# Patient Record
Sex: Female | Born: 1965
Health system: Southern US, Community
[De-identification: ages and names within clinical notes are randomized; demographics above are authoritative.]

## PROBLEM LIST (undated history)

## (undated) DIAGNOSIS — D649 Anemia, unspecified: Secondary | ICD-10-CM

## (undated) DIAGNOSIS — E669 Obesity, unspecified: Secondary | ICD-10-CM

## (undated) DIAGNOSIS — K219 Gastro-esophageal reflux disease without esophagitis: Secondary | ICD-10-CM

## (undated) DIAGNOSIS — Z5189 Encounter for other specified aftercare: Secondary | ICD-10-CM

## (undated) DIAGNOSIS — R232 Flushing: Secondary | ICD-10-CM

## (undated) DIAGNOSIS — R635 Abnormal weight gain: Secondary | ICD-10-CM

## (undated) DIAGNOSIS — E119 Type 2 diabetes mellitus without complications: Secondary | ICD-10-CM

## (undated) DIAGNOSIS — K760 Fatty (change of) liver, not elsewhere classified: Secondary | ICD-10-CM

## (undated) DIAGNOSIS — F410 Panic disorder [episodic paroxysmal anxiety] without agoraphobia: Secondary | ICD-10-CM

## (undated) DIAGNOSIS — M797 Fibromyalgia: Secondary | ICD-10-CM

## (undated) DIAGNOSIS — Z9889 Other specified postprocedural states: Secondary | ICD-10-CM

## (undated) DIAGNOSIS — F419 Anxiety disorder, unspecified: Secondary | ICD-10-CM

## (undated) DIAGNOSIS — R42 Dizziness and giddiness: Secondary | ICD-10-CM

## (undated) DIAGNOSIS — I10 Essential (primary) hypertension: Secondary | ICD-10-CM

## (undated) DIAGNOSIS — R112 Nausea with vomiting, unspecified: Secondary | ICD-10-CM

## (undated) DIAGNOSIS — IMO0002 Reserved for concepts with insufficient information to code with codable children: Secondary | ICD-10-CM

## (undated) DIAGNOSIS — G709 Myoneural disorder, unspecified: Secondary | ICD-10-CM

## (undated) DIAGNOSIS — M109 Gout, unspecified: Secondary | ICD-10-CM

## (undated) HISTORY — DX: Fatty (change of) liver, not elsewhere classified: K76.0

## (undated) HISTORY — DX: Fibromyalgia: M79.7

## (undated) HISTORY — DX: Reserved for concepts with insufficient information to code with codable children: IMO0002

## (undated) HISTORY — PX: ABDOMINAL HYSTERECTOMY: SHX81

## (undated) HISTORY — PX: BREAST REDUCTION SURGERY: SHX8

## (undated) HISTORY — PX: LAPAROSCOPY: SHX197

## (undated) HISTORY — DX: Flushing: R23.2

## (undated) HISTORY — PX: ECTOPIC PREGNANCY SURGERY: SHX613

## (undated) HISTORY — PX: REDUCTION MAMMAPLASTY: SUR839

## (undated) HISTORY — DX: Anemia, unspecified: D64.9

## (undated) HISTORY — DX: Obesity, unspecified: E66.9

## (undated) HISTORY — DX: Gout, unspecified: M10.9

## (undated) HISTORY — DX: Abnormal weight gain: R63.5

## (undated) HISTORY — DX: Myoneural disorder, unspecified: G70.9

## (undated) HISTORY — DX: Encounter for other specified aftercare: Z51.89

---

## 2001-06-04 ENCOUNTER — Encounter: Payer: Self-pay | Admitting: Emergency Medicine

## 2001-06-04 ENCOUNTER — Inpatient Hospital Stay (HOSPITAL_COMMUNITY): Admission: EM | Admit: 2001-06-04 | Discharge: 2001-06-05 | Payer: Self-pay | Admitting: Emergency Medicine

## 2002-01-27 ENCOUNTER — Other Ambulatory Visit: Admission: RE | Admit: 2002-01-27 | Discharge: 2002-01-27 | Payer: Self-pay | Admitting: Obstetrics and Gynecology

## 2002-06-13 ENCOUNTER — Inpatient Hospital Stay (HOSPITAL_COMMUNITY): Admission: AD | Admit: 2002-06-13 | Discharge: 2002-06-13 | Payer: Self-pay | Admitting: *Deleted

## 2003-02-17 ENCOUNTER — Emergency Department (HOSPITAL_COMMUNITY): Admission: EM | Admit: 2003-02-17 | Discharge: 2003-02-17 | Payer: Self-pay | Admitting: Emergency Medicine

## 2003-02-17 ENCOUNTER — Encounter: Payer: Self-pay | Admitting: Emergency Medicine

## 2004-07-13 ENCOUNTER — Ambulatory Visit (HOSPITAL_COMMUNITY): Admission: RE | Admit: 2004-07-13 | Discharge: 2004-07-13 | Payer: Self-pay | Admitting: Obstetrics and Gynecology

## 2005-08-20 ENCOUNTER — Emergency Department (HOSPITAL_COMMUNITY): Admission: EM | Admit: 2005-08-20 | Discharge: 2005-08-20 | Payer: Self-pay | Admitting: Family Medicine

## 2006-09-05 ENCOUNTER — Emergency Department (HOSPITAL_COMMUNITY): Admission: EM | Admit: 2006-09-05 | Discharge: 2006-09-05 | Payer: Self-pay | Admitting: Emergency Medicine

## 2007-01-20 ENCOUNTER — Emergency Department (HOSPITAL_COMMUNITY): Admission: EM | Admit: 2007-01-20 | Discharge: 2007-01-20 | Payer: Self-pay | Admitting: Emergency Medicine

## 2007-10-30 ENCOUNTER — Other Ambulatory Visit: Admission: RE | Admit: 2007-10-30 | Discharge: 2007-10-30 | Payer: Self-pay | Admitting: Obstetrics and Gynecology

## 2008-01-04 ENCOUNTER — Emergency Department (HOSPITAL_COMMUNITY): Admission: EM | Admit: 2008-01-04 | Discharge: 2008-01-04 | Payer: Self-pay | Admitting: Emergency Medicine

## 2009-04-29 ENCOUNTER — Other Ambulatory Visit: Admission: RE | Admit: 2009-04-29 | Discharge: 2009-04-29 | Payer: Self-pay | Admitting: Obstetrics and Gynecology

## 2010-09-14 ENCOUNTER — Ambulatory Visit (HOSPITAL_COMMUNITY): Admission: RE | Admit: 2010-09-14 | Discharge: 2010-09-14 | Payer: Self-pay | Admitting: Internal Medicine

## 2010-10-05 ENCOUNTER — Emergency Department (HOSPITAL_COMMUNITY): Admission: EM | Admit: 2010-10-05 | Discharge: 2010-10-05 | Payer: Self-pay | Admitting: Emergency Medicine

## 2010-12-24 ENCOUNTER — Encounter: Payer: Self-pay | Admitting: Obstetrics and Gynecology

## 2011-03-03 ENCOUNTER — Inpatient Hospital Stay (INDEPENDENT_AMBULATORY_CARE_PROVIDER_SITE_OTHER)
Admission: RE | Admit: 2011-03-03 | Discharge: 2011-03-03 | Disposition: A | Discharge status: Home / Self Care | Payer: Self-pay | Source: Ambulatory Visit | Attending: Family Medicine | Admitting: Family Medicine

## 2011-03-03 DIAGNOSIS — R5381 Other malaise: Secondary | ICD-10-CM

## 2011-03-03 LAB — POCT I-STAT, CHEM 8
BUN: 11 mg/dL (ref 6–23)
Calcium, Ion: 1.16 mmol/L (ref 1.12–1.32)
Chloride: 102 mEq/L (ref 96–112)
Glucose, Bld: 107 mg/dL — ABNORMAL HIGH (ref 70–99)
Hemoglobin: 16.3 g/dL — ABNORMAL HIGH (ref 12.0–15.0)
TCO2: 28 mmol/L (ref 0–100)

## 2011-03-03 LAB — POCT URINALYSIS DIP (DEVICE)
Bilirubin Urine: NEGATIVE
Glucose, UA: NEGATIVE mg/dL
Ketones, ur: NEGATIVE mg/dL
Nitrite: NEGATIVE
Protein, ur: NEGATIVE mg/dL
Specific Gravity, Urine: 1.015 (ref 1.005–1.030)
Urobilinogen, UA: 0.2 mg/dL (ref 0.0–1.0)

## 2011-03-03 LAB — POCT PREGNANCY, URINE: Preg Test, Ur: NEGATIVE

## 2011-05-04 ENCOUNTER — Inpatient Hospital Stay (HOSPITAL_COMMUNITY)
Admission: AD | Admit: 2011-05-04 | Discharge: 2011-05-04 | Disposition: A | Discharge status: Home / Self Care | Payer: BC Managed Care – PPO | Source: Ambulatory Visit | Attending: Obstetrics and Gynecology | Admitting: Obstetrics and Gynecology

## 2011-05-04 DIAGNOSIS — N920 Excessive and frequent menstruation with regular cycle: Secondary | ICD-10-CM | POA: Insufficient documentation

## 2011-05-04 DIAGNOSIS — N946 Dysmenorrhea, unspecified: Secondary | ICD-10-CM | POA: Insufficient documentation

## 2011-05-04 LAB — POCT PREGNANCY, URINE: Preg Test, Ur: NEGATIVE

## 2011-05-04 LAB — CBC
MCH: 29.3 pg (ref 26.0–34.0)
MCV: 84.2 fL (ref 78.0–100.0)
Platelets: 257 10*3/uL (ref 150–400)
RDW: 13.5 % (ref 11.5–15.5)
WBC: 11.4 10*3/uL — ABNORMAL HIGH (ref 4.0–10.5)

## 2011-05-04 LAB — URINALYSIS, ROUTINE W REFLEX MICROSCOPIC
Protein, ur: 100 mg/dL — AB
Specific Gravity, Urine: 1.02 (ref 1.005–1.030)
Urobilinogen, UA: 1 mg/dL (ref 0.0–1.0)
pH: 5.5 (ref 5.0–8.0)

## 2011-05-04 LAB — URINE MICROSCOPIC-ADD ON

## 2011-05-05 LAB — URINE CULTURE: Colony Count: 6000

## 2011-05-22 ENCOUNTER — Inpatient Hospital Stay (INDEPENDENT_AMBULATORY_CARE_PROVIDER_SITE_OTHER)
Admission: RE | Admit: 2011-05-22 | Discharge: 2011-05-22 | Disposition: A | Discharge status: Home / Self Care | Payer: BC Managed Care – PPO | Source: Ambulatory Visit | Attending: Family Medicine | Admitting: Family Medicine

## 2011-05-22 DIAGNOSIS — N949 Unspecified condition associated with female genital organs and menstrual cycle: Secondary | ICD-10-CM

## 2011-05-23 LAB — POCT I-STAT, CHEM 8
Calcium, Ion: 1.16 mmol/L (ref 1.12–1.32)
Chloride: 100 mEq/L (ref 96–112)
Creatinine, Ser: 0.7 mg/dL (ref 0.50–1.10)
Glucose, Bld: 102 mg/dL — ABNORMAL HIGH (ref 70–99)
Potassium: 3.6 mEq/L (ref 3.5–5.1)
Sodium: 139 mEq/L (ref 135–145)

## 2011-05-23 LAB — POCT URINALYSIS DIP (DEVICE)
Bilirubin Urine: NEGATIVE
Ketones, ur: NEGATIVE mg/dL
Nitrite: NEGATIVE
Protein, ur: NEGATIVE mg/dL
Urobilinogen, UA: 0.2 mg/dL (ref 0.0–1.0)
pH: 7 (ref 5.0–8.0)

## 2011-06-08 ENCOUNTER — Encounter (HOSPITAL_COMMUNITY): Payer: Self-pay

## 2011-06-08 ENCOUNTER — Encounter (HOSPITAL_COMMUNITY)
Admission: RE | Admit: 2011-06-08 | Discharge: 2011-06-08 | Disposition: A | Discharge status: Home / Self Care | Payer: BC Managed Care – PPO | Source: Ambulatory Visit | Attending: Family Medicine | Admitting: Family Medicine

## 2011-06-08 HISTORY — DX: Other specified postprocedural states: Z98.890

## 2011-06-08 HISTORY — DX: Essential (primary) hypertension: I10

## 2011-06-08 HISTORY — DX: Other specified postprocedural states: R11.2

## 2011-06-08 HISTORY — DX: Dizziness and giddiness: R42

## 2011-06-08 HISTORY — DX: Gastro-esophageal reflux disease without esophagitis: K21.9

## 2011-06-08 HISTORY — DX: Anxiety disorder, unspecified: F41.9

## 2011-06-08 LAB — BASIC METABOLIC PANEL
BUN: 7 mg/dL (ref 6–23)
CO2: 28 mEq/L (ref 19–32)
Chloride: 101 mEq/L (ref 96–112)
Glucose, Bld: 100 mg/dL — ABNORMAL HIGH (ref 70–99)

## 2011-06-08 LAB — CBC
HCT: 39.8 % (ref 36.0–46.0)
Hemoglobin: 13.2 g/dL (ref 12.0–15.0)
MCH: 27.7 pg (ref 26.0–34.0)
MCHC: 33.2 g/dL (ref 30.0–36.0)
MCV: 83.6 fL (ref 78.0–100.0)
Platelets: 262 10*3/uL (ref 150–400)
RDW: 13.2 % (ref 11.5–15.5)
WBC: 6.9 10*3/uL (ref 4.0–10.5)

## 2011-06-08 LAB — SURGICAL PCR SCREEN
MRSA, PCR: INVALID — AB
Staphylococcus aureus: INVALID — AB

## 2011-06-08 NOTE — Pre-Procedure Instructions (Signed)
EKG done-spoke with Dr Towanda Malkin to hold HCTZ am of surgery, aware of b/p 145/94

## 2011-06-08 NOTE — Patient Instructions (Addendum)
[facility]  06/08/2011   Your procedure is scheduled on:  Wednesday  Report to [facility] at 9 AM.  Call this number if you have problems the morning of surgery: [phone]   Remember:   Do not eat food:After Midnight.  Do not drink clear liquids: After Midnight.  Take these medicines the morning of surgery with A SIP OF WATER: per anesthesia   Do not wear jewelry, make-up or nail polish.  Do not bring valuables to the hospital.  Contacts, dentures or bridgework may not be worn into surgery.  Leave suitcase in the car. After surgery it may be brought to your room.  For patients admitted to the hospital, checkout time is 11:00 AM the day of discharge.   Patients discharged the day of surgery will not be allowed to drive home.  Name and phone number of your driver: xx  Special Instructions: take prilosec morning of surgery with small sips of water, hold hctz dos per Dr Dawayne Cirri  Please read over the following fact sheets that you were given: Surgical Site Infection Information Sheet    Written instructions given to pt-

## 2011-06-10 LAB — MRSA CULTURE

## 2011-06-13 ENCOUNTER — Encounter: Payer: Self-pay | Admitting: Obstetrics and Gynecology

## 2011-06-13 MED ORDER — DEXTROSE 5 % IV SOLN
1.0000 g | Freq: Once | INTRAVENOUS | Status: AC
  Administered 2011-06-14: 1 g via INTRAVENOUS
  Filled 2011-06-13 (×2): qty 1

## 2011-06-14 ENCOUNTER — Ambulatory Visit (HOSPITAL_COMMUNITY): Payer: BC Managed Care – PPO | Admitting: Anesthesiology

## 2011-06-14 ENCOUNTER — Encounter (HOSPITAL_COMMUNITY)
Admission: EM | Disposition: A | Discharge status: Home / Self Care | Payer: Self-pay | Source: Ambulatory Visit | Attending: Obstetrics and Gynecology

## 2011-06-14 ENCOUNTER — Ambulatory Visit (HOSPITAL_COMMUNITY)
Admission: EM | Admit: 2011-06-14 | Discharge: 2011-06-15 | Disposition: A | Discharge status: Home / Self Care | Payer: BC Managed Care – PPO | Source: Ambulatory Visit | Attending: Obstetrics and Gynecology | Admitting: Obstetrics and Gynecology

## 2011-06-14 ENCOUNTER — Other Ambulatory Visit: Payer: Self-pay | Admitting: Obstetrics and Gynecology

## 2011-06-14 ENCOUNTER — Encounter (HOSPITAL_COMMUNITY): Payer: Self-pay | Admitting: Anesthesiology

## 2011-06-14 DIAGNOSIS — D250 Submucous leiomyoma of uterus: Secondary | ICD-10-CM | POA: Insufficient documentation

## 2011-06-14 DIAGNOSIS — Z01818 Encounter for other preprocedural examination: Secondary | ICD-10-CM | POA: Insufficient documentation

## 2011-06-14 DIAGNOSIS — Z01812 Encounter for preprocedural laboratory examination: Secondary | ICD-10-CM | POA: Insufficient documentation

## 2011-06-14 DIAGNOSIS — IMO0002 Reserved for concepts with insufficient information to code with codable children: Secondary | ICD-10-CM | POA: Insufficient documentation

## 2011-06-14 DIAGNOSIS — Z90710 Acquired absence of both cervix and uterus: Secondary | ICD-10-CM

## 2011-06-14 DIAGNOSIS — N840 Polyp of corpus uteri: Secondary | ICD-10-CM | POA: Insufficient documentation

## 2011-06-14 DIAGNOSIS — N8000 Endometriosis of the uterus, unspecified: Secondary | ICD-10-CM | POA: Insufficient documentation

## 2011-06-14 DIAGNOSIS — N949 Unspecified condition associated with female genital organs and menstrual cycle: Secondary | ICD-10-CM | POA: Insufficient documentation

## 2011-06-14 DIAGNOSIS — N938 Other specified abnormal uterine and vaginal bleeding: Secondary | ICD-10-CM | POA: Insufficient documentation

## 2011-06-14 HISTORY — PX: LAPAROSCOPIC ASSISTED VAGINAL HYSTERECTOMY: SHX5398

## 2011-06-14 SURGERY — HYSTERECTOMY, VAGINAL, LAPAROSCOPY-ASSISTED
Anesthesia: General | Wound class: Clean Contaminated

## 2011-06-14 MED ORDER — PROPOFOL 10 MG/ML IV EMUL
INTRAVENOUS | Status: DC | PRN
  Administered 2011-06-14: 200 mg via INTRAVENOUS

## 2011-06-14 MED ORDER — MIDAZOLAM HCL 5 MG/5ML IJ SOLN
INTRAMUSCULAR | Status: DC | PRN
  Administered 2011-06-14: 2 mg via INTRAVENOUS

## 2011-06-14 MED ORDER — NEOSTIGMINE METHYLSULFATE 1 MG/ML IJ SOLN
INTRAMUSCULAR | Status: AC
  Filled 2011-06-14: qty 10

## 2011-06-14 MED ORDER — MENTHOL 3 MG MT LOZG: 1.0000 | LOZENGE | OROMUCOSAL | Status: DC | PRN

## 2011-06-14 MED ORDER — ONDANSETRON HCL 4 MG/2ML IJ SOLN
INTRAMUSCULAR | Status: DC | PRN
  Administered 2011-06-14: 4 mg via INTRAVENOUS

## 2011-06-14 MED ORDER — TRAMADOL HCL 50 MG PO TABS: 50.0000 mg | ORAL_TABLET | Freq: Four times a day (QID) | ORAL | Status: DC | PRN

## 2011-06-14 MED ORDER — MORPHINE SULFATE 4 MG/ML IJ SOLN: 1.0000 mg | INTRAMUSCULAR | Status: DC | PRN

## 2011-06-14 MED ORDER — BISACODYL 10 MG RE SUPP: 10.0000 mg | Freq: Every day | RECTAL | Status: DC | PRN

## 2011-06-14 MED ORDER — SCOPOLAMINE 1 MG/3DAYS TD PT72
1.0000 | MEDICATED_PATCH | TRANSDERMAL | Status: DC
  Administered 2011-06-14: 1.5 mg via TRANSDERMAL

## 2011-06-14 MED ORDER — IBUPROFEN 600 MG PO TABS
600.0000 mg | ORAL_TABLET | Freq: Four times a day (QID) | ORAL | Status: DC | PRN
  Administered 2011-06-14: 600 mg via ORAL
  Filled 2011-06-14: qty 1

## 2011-06-14 MED ORDER — LIDOCAINE HCL (CARDIAC) 20 MG/ML IV SOLN
INTRAVENOUS | Status: DC | PRN
  Administered 2011-06-14: 80 mg via INTRAVENOUS

## 2011-06-14 MED ORDER — ZOLPIDEM TARTRATE 5 MG PO TABS: 5.0000 mg | ORAL_TABLET | Freq: Every evening | ORAL | Status: DC | PRN

## 2011-06-14 MED ORDER — NEOSTIGMINE METHYLSULFATE 1 MG/ML IJ SOLN
INTRAMUSCULAR | Status: DC | PRN
  Administered 2011-06-14: 2 mg via INTRAMUSCULAR

## 2011-06-14 MED ORDER — GLYCOPYRROLATE 0.2 MG/ML IJ SOLN
INTRAMUSCULAR | Status: AC
  Filled 2011-06-14: qty 5

## 2011-06-14 MED ORDER — ROCURONIUM BROMIDE 100 MG/10ML IV SOLN
INTRAVENOUS | Status: DC | PRN
  Administered 2011-06-14: 40 mg via INTRAVENOUS

## 2011-06-14 MED ORDER — LACTATED RINGERS IV SOLN
INTRAVENOUS | Status: DC
  Administered 2011-06-14 (×3): via INTRAVENOUS

## 2011-06-14 MED ORDER — SIMETHICONE 80 MG PO CHEW: 80.0000 mg | CHEWABLE_TABLET | Freq: Four times a day (QID) | ORAL | Status: DC | PRN

## 2011-06-14 MED ORDER — GLYCOPYRROLATE 0.2 MG/ML IJ SOLN
INTRAMUSCULAR | Status: DC | PRN
  Administered 2011-06-14: 0.3 mg via INTRAVENOUS

## 2011-06-14 MED ORDER — BUPIVACAINE HCL (PF) 0.25 % IJ SOLN
INTRAMUSCULAR | Status: DC | PRN
  Administered 2011-06-14: 10 mL

## 2011-06-14 MED ORDER — FENTANYL CITRATE 0.05 MG/ML IJ SOLN
INTRAMUSCULAR | Status: AC
  Filled 2011-06-14: qty 2

## 2011-06-14 MED ORDER — ALUM & MAG HYDROXIDE-SIMETH 200-200-20 MG/5ML PO SUSP: 30.0000 mL | ORAL | Status: DC | PRN

## 2011-06-14 MED ORDER — PROMETHAZINE HCL 25 MG/ML IJ SOLN
12.5000 mg | INTRAMUSCULAR | Status: DC | PRN
  Administered 2011-06-14: 12.5 mg via INTRAVENOUS
  Filled 2011-06-14: qty 1

## 2011-06-14 MED ORDER — DEXAMETHASONE SODIUM PHOSPHATE 10 MG/ML IJ SOLN
INTRAMUSCULAR | Status: DC | PRN
  Administered 2011-06-14: 10 mg via INTRAVENOUS

## 2011-06-14 MED ORDER — LACTATED RINGERS IR SOLN
Status: DC | PRN
  Administered 2011-06-14: 3000 mL

## 2011-06-14 MED ORDER — FENTANYL CITRATE 0.05 MG/ML IJ SOLN
INTRAMUSCULAR | Status: DC | PRN
  Administered 2011-06-14: 50 ug via INTRAVENOUS
  Administered 2011-06-14: 150 ug via INTRAVENOUS
  Administered 2011-06-14 (×3): 50 ug via INTRAVENOUS

## 2011-06-14 MED ORDER — LACTATED RINGERS IV SOLN
INTRAVENOUS | Status: DC
  Administered 2011-06-14 – 2011-06-15 (×3): via INTRAVENOUS

## 2011-06-14 MED ORDER — FENTANYL CITRATE 0.05 MG/ML IJ SOLN: 25.0000 ug | INTRAMUSCULAR | Status: DC | PRN

## 2011-06-14 MED ORDER — SCOPOLAMINE 1 MG/3DAYS TD PT72
MEDICATED_PATCH | TRANSDERMAL | Status: AC
  Administered 2011-06-14: 1.5 mg via TRANSDERMAL
  Filled 2011-06-14: qty 1

## 2011-06-14 SURGICAL SUPPLY — 43 items
BLADE SURG 15 STRL LF C SS BP (BLADE) ×1 IMPLANT
BLADE SURG 15 STRL SS (BLADE) ×1
CABLE HIGH FREQUENCY MONO STRZ (ELECTRODE) IMPLANT
CLOTH BEACON ORANGE TIMEOUT ST (SAFETY) ×2 IMPLANT
CONT PATH 16OZ SNAP LID 3702 (MISCELLANEOUS) ×2 IMPLANT
COVER TABLE BACK 60X90 (DRAPES) ×2 IMPLANT
DECANTER SPIKE VIAL GLASS SM (MISCELLANEOUS) IMPLANT
DERMABOND ADVANCED (GAUZE/BANDAGES/DRESSINGS) ×1 IMPLANT
DERMABOND ADVANCED .7 DNX12 (GAUZE/BANDAGES/DRESSINGS) ×1 IMPLANT
DRAPE UTILITY XL STRL (DRAPES) ×2 IMPLANT
DRSG COVADERM PLUS 2X2 (GAUZE/BANDAGES/DRESSINGS) ×2 IMPLANT
ELECT LIGASURE LONG (ELECTRODE) ×2 IMPLANT
ELECT REM PT RETURN 9FT ADLT (ELECTROSURGICAL)
ELECTRODE REM PT RTRN 9FT ADLT (ELECTROSURGICAL) IMPLANT
FORCEPS CUTTING 45CM 5MM (CUTTING FORCEPS) ×2 IMPLANT
GLOVE BIO SURGEON STRL SZ8 (GLOVE) ×2 IMPLANT
GLOVE BIOGEL PI IND STRL 6.5 (GLOVE) ×1 IMPLANT
GLOVE BIOGEL PI INDICATOR 6.5 (GLOVE) ×1
GLOVE SURG ORTHO 8.0 STRL STRW (GLOVE) ×6 IMPLANT
GOWN BRE IMP SLV AUR LG STRL (GOWN DISPOSABLE) ×6 IMPLANT
GOWN BRE IMP SLV AUR XL STRL (GOWN DISPOSABLE) ×2 IMPLANT
NEEDLE INSUFFLATION 14GA 120MM (NEEDLE) ×2 IMPLANT
NS IRRIG 1000ML POUR BTL (IV SOLUTION) ×2 IMPLANT
PACK LAVH (CUSTOM PROCEDURE TRAY) ×2 IMPLANT
SET IRRIG TUBING LAPAROSCOPIC (IRRIGATION / IRRIGATOR) IMPLANT
SOLUTION ELECTROLUBE (MISCELLANEOUS) IMPLANT
STRIP CLOSURE SKIN 1/4X3 (GAUZE/BANDAGES/DRESSINGS) IMPLANT
SUT MNCRL 0 MO-4 VIOLET 18 CR (SUTURE) ×3 IMPLANT
SUT MNCRL 0 VIOLET 6X18 (SUTURE) ×1 IMPLANT
SUT MNCRL AB 0 CT1 27 (SUTURE) ×2 IMPLANT
SUT MON AB 2-0 CT1 36 (SUTURE) IMPLANT
SUT MONOCRYL 0 6X18 (SUTURE) ×1
SUT MONOCRYL 0 MO 4 18  CR/8 (SUTURE) ×3
SUT VIC AB 3-0 SH 27 (SUTURE)
SUT VIC AB 3-0 SH 27X BRD (SUTURE) IMPLANT
SUT VICRYL 0 UR6 27IN ABS (SUTURE) ×2 IMPLANT
SUT VICRYL RAPIDE 3 0 (SUTURE) ×2 IMPLANT
TOWEL OR 17X24 6PK STRL BLUE (TOWEL DISPOSABLE) ×4 IMPLANT
TRAY FOLEY CATH 14FR (SET/KITS/TRAYS/PACK) ×2 IMPLANT
TROCAR Z-THREAD BLADED 11X100M (TROCAR) ×2 IMPLANT
TROCAR Z-THREAD BLADED 5X100MM (TROCAR) ×4 IMPLANT
WARMER LAPAROSCOPE (MISCELLANEOUS) ×2 IMPLANT
WATER STERILE IRR 1000ML POUR (IV SOLUTION) IMPLANT

## 2011-06-14 NOTE — Op Note (Signed)
[name], [name]               ACCOUNT NO.:  [id number]  MEDICAL RECORD NO.:  [id number]  LOCATION:  9313                          FACILITY:  [facility]  PHYSICIAN:  Monia Sabal. Corinna Capra, M.D.    DATE OF BIRTH:  [birth year]  DATE OF PROCEDURE:  06/14/2011 DATE OF DISCHARGE:                              OPERATIVE REPORT   PREOPERATIVE DIAGNOSES: 1. Abnormal uterine bleeding. 2. Pelvic pain. 3. Dyspareunia. 4. Large leiomyoma.  POSTOPERATIVE DIAGNOSES: 1. Abnormal uterine bleeding. 2. Pelvic pain. 3. Dyspareunia. 4. Large leiomyoma.  PROCEDURE:  Laparoscopic-assisted vaginal hysterectomy with lysis of adhesions.  SURGEON:  Monia Sabal. Corinna Capra, MD  ASSISTANT:  Barbaraann Rondo, MD  ANESTHESIA:  General endotracheal.  INDICATIONS:  Amy [name] is a 45 year old G1, P0 with no further childbearing desires with worsening problems of pain, abnormal uterine bleeding, menorrhagia, and a known large fibroid with submucosal component.  She presents for a laparoscopic-assisted vaginal hysterectomy.  The risks and benefits were discussed at length and informed consent was obtained.  FINDINGS AT THE TIME OF SURGERY:  A 10-week size fibroid uterus.  Left fallopian tube was surgically absent, right fallopian tube appeared to be normal as did both ovaries.  Appendix and liver appeared to be normal.  There were adhesions left descending colon to the pelvic sidewall.  DESCRIPTION OF PROCEDURE:  After adequate analgesia, the patient was placed in the dorsal lithotomy position.  She was sterilely prepped and draped.  The bladder was sterilely drained.  A weighted speculum was placed.  Tenaculum was placed on the anterior lip of the cervix.  A 1-cm infraumbilical skin incision was made.  A Veress needle was inserted. The abdomen was insufflated wit dullness of percussion.  A 5-mm trocar was inserted to the left of midline 2 fingerbreadths above the pubic symphysis.  After careful systematic evaluation of  the abdomen and pelvis, a gyrus cutting forceps was inserted through the operative scope.  The left utero-ovarian ligament was coagulated and dissected as was the remnant of the left fallopian tube and the round ligament with the left tube and ovary falling to the left side wall and good hemostasis achieved.  A small serous cyst was drained of the left ovary and the ovary appeared to be normal.  The right fallopian tube was then coagulated and dissected as was the right utero-ovarian ligament and round ligament, freeing the right tube and ovary to the pelvic sidewall with good hemostasis achieved.  The uterovesical junction was then elevated and a bladder flap was created using the gyrus cutting forceps. The abdomen was then desufflated.  Legs repositioned.  Weighted speculum was placed in the vagina.  Posterior colpotomy was performed.  Cervix was then circumscribed with Bovie cautery.  A LigaSure instrument was used to ligate across the uterosacral ligaments bilaterally, cardinal ligaments bilaterally, and the bladder pillars bilaterally.  Dissected with Mayo scissors.  The anterior vagina was then dissected off the anterior surface of the cervix and the anterior peritoneum was entered sharply and Deaver retractor was placed underneath the bladder.  The uterine vasculature was identified bilaterally, did ligate with the LigaSure instruments and dissected with Mayo scissors and then the inferior portions  of the broad ligaments were then dissected.  After ligating with the LigaSure instrument, the uterus was removed with a weight of 230 g.  The uterosacral ligaments were identified again and suture ligated in a figure-of-eight fashion.  The posterior peritoneum was then closed in a pursestring fashion using 0 Monocryl suture.  The vagina was then closed in a vertical fashion, plicating the uterosacral ligaments in the midline and closing with figure-of-eight 0 Monocryl suture with good  approximation, good hemostasis achieved.  Foley catheter was then placed with return of clear yellow urine.  The legs were repositioned.  The abdomen reinsufflated.  A Nezhat suction irrigator was then used to irrigate the abdomen and pelvis.  Small peritoneal bleeders were coagulated using bipolar cautery and after careful systematic evaluation of the pelvis revealed good hemostasis, good peristalsis of bilateral ureters, and normal-appearing anatomy at this time.  The abdomen was then desufflated.  Trocars were removed. The infraumbilical skin incision was closed with 0 Vicryl interrupted suture in the fascia, 3-0 Vicryl Rapide subcuticular suture.  The 5-mm site was closed with 3-0 Vicryl Rapide subcuticular suture and Dermabond.  Incisions were injected with 0.25% Marcaine, a total of 10 mL was used.  The patient was transferred to recovery room in stable condition.  Sponge and instrument count was normal x3.  Estimated blood loss was 100 mL.  The patient received 1 g of cefotetan preoperatively.     Monia Sabal Corinna Capra, M.D.     DCL/MEDQ  D:  06/14/2011  T:  06/14/2011  Job:  [id number]

## 2011-06-14 NOTE — Anesthesia Preprocedure Evaluation (Addendum)
Anesthesia Evaluation  Name, MR# and DOB Patient awake  General Assessment Comment  Reviewed: Allergy & Precautions  History of Anesthesia Complications (+) PONV  Airway Mallampati: III TM Distance: >3 FB Neck ROM: Full    Dental No notable dental hx (+) Teeth Intact   Pulmonaryneg pulmonary ROS    clear to auscultation  pulmonary exam normal   Cardiovascular hypertension (controlled on HCTZ), Pt. on medications Regular Normal   Neuro/Psych (+) {AN ROS/MED HX NEURO HEADACHES (+) Anxiety, Negative Neurological ROS   GI/Hepatic/Renal negative Liver ROS, and negative Renal ROS (+)       Endo/Other  Negative Endocrine ROS (+)   Abdominal (+) obese,  Abdomen: soft.    Musculoskeletal negative musculoskeletal ROS (+)  Hematology negative hematology ROS (+)   Peds  Reproductive/Obstetrics negative OB ROS   Anesthesia Other Findings             Anesthesia Physical Anesthesia Plan  ASA: III  Anesthesia Plan: General   Post-op Pain Management:    Induction: Intravenous  Airway Management Planned: Oral ETT  Additional Equipment:   Intra-op Plan:   Post-operative Plan: Extubation in OR  Informed Consent: I have reviewed the patients History and Physical, chart, labs and discussed the procedure including the risks, benefits and alternatives for the proposed anesthesia with the patient or authorized representative who has indicated his/her understanding and acceptance.   Dental advisory given  Plan Discussed with: CRNA  Anesthesia Plan Comments:         Anesthesia Quick Evaluation

## 2011-06-14 NOTE — Procedures (Signed)
LAVH with Loa Dr Corinna Capra and Asst Gertie Fey EBL 672CN No Comlications Dictation [id number]

## 2011-06-14 NOTE — Anesthesia Postprocedure Evaluation (Signed)
  Anesthesia Post-op Note  Patient: Amy [name]  Procedure(s) Performed:  LAPAROSCOPIC ASSISTED VAGINAL HYSTERECTOMY - Abdomen and vagina  Patient Location: PACU  Anesthesia Type: General  Level of Consciousness: awake, alert  and oriented  Airway and Oxygen Therapy: Patient Spontanous Breathing and Patient connected to nasal cannula oxygen  Post-op Pain: mild  Post-op Assessment: Post-op Vital signs reviewed, Patient's Cardiovascular Status Stable, Respiratory Function Stable, Patent Airway, No signs of Nausea or vomiting, Pain level controlled, No headache and No backache  Post-op Vital Signs: Reviewed and stable  Complications: No apparent anesthesia complications

## 2011-06-14 NOTE — Transfer of Care (Signed)
Immediate Anesthesia Transfer of Care Note

## 2011-06-14 NOTE — Addendum Note (Signed)
Addendum  created 06/14/11 1438 by Laverle Hobby   Modules edited:Anesthesia Events

## 2011-06-14 NOTE — H&P (Signed)
[name], LOOSE               ACCOUNT NO.:  [id number]  MEDICAL RECORD NO.:  [id number]  LOCATION:                                 FACILITY:  PHYSICIAN:  Monia Sabal. Corinna Capra, M.D.    DATE OF BIRTH:  August 01, 1966  DATE OF ADMISSION:  06/14/2011 DATE OF DISCHARGE:                             HISTORY & PHYSICAL   HISTORY OF PRESENT ILLNESS:  [name] is a 45 year old G1, P0 with worsening problems with abnormal uterine bleeding, pelvic pain, dyspareunia, and enlarged uterus.  She desires definitive surgical intervention and wished to proceed with hysterectomy.  She has been infertile, has a history of ectopics, and has had both of her tubes removed in the past, so she knows that fertility is not possible without [facility] evaluation and she wishes to proceed with hysterectomy and knows that she will not be able to have children.  Most recently, she presented to the emergency room for evaluation of menorrhagia what she thought were fist-sized clots.  Had recent saline infusion ultrasound showing a large submucosal fibroid which was 4.8 cm in size.  PAST MEDICAL HISTORY:  Significant for hypertension, currently on hydrochlorothiazide 12.5 mg a day.  PAST SURGICAL HISTORY:  She has had laparoscopies in the past for ectopics and removal of her fallopian tubes and also breast reduction.  ALLERGIES:  She has an allergy to CIPRO.  She also has a sensitivity to DILAUDID which makes her very nauseated and similar reactions to [place].  PHYSICAL EXAMINATION:  VITAL SIGNS:  Her blood pressure is 130/92 and weighs 211 pounds. HEART:  Regular rate and rhythm. LUNGS:  Clear to auscultation bilaterally. ABDOMEN:  Nondistended and nontender. GU:  The uterus is anteverted, mobile, mildly tender to deep palpation, slightly enlarged.  IMAGING:  Recent ultrasound from June 2012 shows uterus measuring 9.8 x 5.5 x 7.2 cm and which is consistent with a 10-week size fibroid uterus with a 4.8  cm fibroid which is submucosal.  Normal-appearing ovaries.  IMPRESSION:  Abnormal uterine bleeding, pelvic pain, pain with intercourse, and large fibroids.  No childbearing desires.  She desires definitive surgical intervention and requests hysterectomy.  PLAN:  Laparoscopic-assisted vaginal hysterectomy, possible total abdominal hysterectomy, try preservation of both ovaries.  I have discussed the procedure at length covering its risks and benefits.  The risks include but not limited to risk of infection, bleeding, damage to bowel, bladder, ureters, ovaries, possibly the pain may not improve but could recur or worsen.  She does give her informed consent and family wished to proceed.     Monia Sabal Corinna Capra, M.D.     DCL/MEDQ  D:  06/13/2011  T:  06/13/2011  Job:  [id number]

## 2011-06-14 NOTE — Anesthesia Procedure Notes (Addendum)
Spinal Block

## 2011-06-14 NOTE — Transfer of Care (Signed)
Immediate Anesthesia Transfer of Care Note  Patient: Amy [name]  Procedure(s) Performed:  LAPAROSCOPIC ASSISTED VAGINAL HYSTERECTOMY - Abdomen and vagina  Patient Location: PACU  Anesthesia Type: General  Level of Consciousness: awake  Airway & Oxygen Therapy: Patient Spontanous Breathing and Patient connected to face mask oxygen  Post-op Assessment: Report given to PACU RN  Post vital signs: Reviewed and stable  Complications: No apparent anesthesia complications

## 2011-06-15 ENCOUNTER — Telehealth (HOSPITAL_COMMUNITY): Payer: Self-pay | Admitting: *Deleted

## 2011-06-15 LAB — CBC
Hemoglobin: 11.9 g/dL — ABNORMAL LOW (ref 12.0–15.0)
MCH: 27.1 pg (ref 26.0–34.0)
MCV: 83.1 fL (ref 78.0–100.0)
Platelets: 270 10*3/uL (ref 150–400)
RDW: 13.2 % (ref 11.5–15.5)
WBC: 12.9 10*3/uL — ABNORMAL HIGH (ref 4.0–10.5)

## 2011-06-15 MED ORDER — TRAMADOL HCL 50 MG PO TABS: 50.0000 mg | ORAL_TABLET | Freq: Four times a day (QID) | ORAL | Status: AC | PRN

## 2011-06-15 NOTE — Progress Notes (Signed)
  Pt with good pain relief VSSAF Abd soft nt bandage dry UOP good  IMP/Plan Continue routine care DL

## 2011-06-15 NOTE — Discharge Summary (Signed)
Physician Discharge Summary  Patient ID: Amy [name] MRN: [id number] DOB/AGE: March 27, 1966 45 y.o.  Admit date: 06/14/2011 Discharge date: 06/15/2011  Admission Diagnoses:  Discharge Diagnoses:  Active Problems:  * No active hospital problems. *    Discharged Condition: good  Hospital Course:Pt had LAVH which was uncomplicated and EBL 829HB Her hospitalization was uncomplicated and had good return of bowel function, ambulation, and tolerating regular diet by day #1.  Her post op HGb was 11.9 and she remained afebrile and dicharged home on motrin and has a Rx for Tramadol which she will unlikely use. Consults: none  Significant Diagnostic Studies: labs Hgb 11..9 Treatments:LAVH  Discharge Exam: Blood pressure 129/81, pulse 82, temperature 98.1 F (36.7 C), temperature source Oral, resp. rate 18, height 5' 5.5" (1.664 m), weight 96.163 kg (212 lb), SpO2 97.00%. General appearance: alert and cooperative GI: soft, non-tender; bowel sounds normal; no masses,  no organomegaly Pelvic: deferred Incision/Wound: clean and dry  Disposition: Home or Self Care  Discharge Orders    Future Orders Please Complete By Expires   Diet general      Foley catheter - discontinue      Increase activity slowly      Discharge instructions      Comments:   FU in office in 2 weeks   Call MD for:  temperature >100.4      Call MD for:  severe uncontrolled pain      Call MD for:  redness, tenderness, or signs of infection (pain, swelling, redness, odor or green/yellow discharge around incision site)        Current Discharge Medication List    START taking these medications   Details  traMADol (ULTRAM) 50 MG tablet Take 1 tablet (50 mg total) by mouth every 6 (six) hours as needed. Qty: 30 tablet, Refills: 0      CONTINUE these medications which have NOT CHANGED   Details  hydrochlorothiazide (,MICROZIDE/HYDRODIURIL,) 12.5 MG capsule Take 12.5 mg by mouth daily.      ibuprofen  (ADVIL,MOTRIN) 200 MG tablet Take 200-400 mg by mouth daily as needed. For pain      naproxen sodium (ANAPROX) 220 MG tablet Take 220-440 mg by mouth daily as needed. For pain      Omeprazole Magnesium (PRILOSEC OTC PO) Take 1 tablet by mouth daily.           Signed: Daryel Kenneth C 06/15/2011, 8:54 AM

## 2011-07-04 ENCOUNTER — Encounter (HOSPITAL_COMMUNITY): Payer: Self-pay | Admitting: Obstetrics and Gynecology

## 2011-08-24 LAB — URINALYSIS, ROUTINE W REFLEX MICROSCOPIC
Bilirubin Urine: NEGATIVE
Glucose, UA: NEGATIVE
Ketones, ur: 40 — AB
Nitrite: NEGATIVE
Specific Gravity, Urine: 1.011
Urobilinogen, UA: 0.2

## 2011-08-24 LAB — I-STAT 8, (EC8 V) (CONVERTED LAB)
Acid-Base Excess: 2
HCT: 47 — ABNORMAL HIGH
Hemoglobin: 16 — ABNORMAL HIGH
Potassium: 3 — ABNORMAL LOW
Sodium: 137
TCO2: 28

## 2011-08-24 LAB — URINE MICROSCOPIC-ADD ON

## 2011-08-24 LAB — POCT I-STAT CREATININE
Creatinine, Ser: 0.8
Operator id: 196461

## 2011-12-04 ENCOUNTER — Encounter (HOSPITAL_COMMUNITY): Payer: Self-pay | Admitting: *Deleted

## 2011-12-04 ENCOUNTER — Emergency Department (INDEPENDENT_AMBULATORY_CARE_PROVIDER_SITE_OTHER)
Admission: EM | Admit: 2011-12-04 | Discharge: 2011-12-04 | Disposition: A | Discharge status: Home / Self Care | Payer: BC Managed Care – PPO | Source: Home / Self Care | Attending: Emergency Medicine | Admitting: Emergency Medicine

## 2011-12-04 DIAGNOSIS — R109 Unspecified abdominal pain: Secondary | ICD-10-CM

## 2011-12-04 MED ORDER — METOCLOPRAMIDE HCL 10 MG PO TABS: 10.0000 mg | ORAL_TABLET | Freq: Three times a day (TID) | ORAL | Status: DC

## 2011-12-04 MED ORDER — BISACODYL 5 MG PO TBEC: 5.0000 mg | DELAYED_RELEASE_TABLET | Freq: Two times a day (BID) | ORAL | Status: AC

## 2011-12-04 MED ORDER — POLYETHYLENE GLYCOL 3350 17 G PO PACK: 17.0000 g | PACK | Freq: Two times a day (BID) | ORAL | Status: AC

## 2011-12-04 MED ORDER — FENTANYL CITRATE 0.05 MG/ML IJ SOLN
INTRAMUSCULAR | Status: AC
  Filled 2011-12-04: qty 2

## 2011-12-04 NOTE — ED Notes (Signed)
Pt c/o feeling bloated, "indigestion" for 2-3 days.  Has hx of H.pylori and takes Prilosec daily since.  States she has been eating more different things during the holidays, states she's gained approx 6 lbs since last week.  Has been having BM's, small ones a couple of times a day.  Had normal BM this am.  Had nausea a few days ago, but none now.  Has doubled her Prilosec without relief.  Denies diarrhea.

## 2011-12-04 NOTE — ED Provider Notes (Signed)
History     CSN: [id number]  Arrival date & time 12/04/11  0804   First MD Initiated Contact with Patient 12/04/11 [phone]      Chief Complaint  Patient presents with  . Abdominal Pain    (Consider location/radiation/quality/duration/timing/severity/associated sxs/prior treatment) HPI Comments: For about 2 days been feeling "bloated" have gone to have bowel movements, but very small ones, Wondering if i can have the H.Pilory infection again? No nausea "now", No fevers, No vomiting. Have gain some weight during the last week or so, increased Prilosec to 40 mg"  Patient is a 45 y.o. female presenting with abdominal pain. The history is provided by the patient.  Abdominal Pain The primary symptoms of the illness include abdominal pain. The primary symptoms of the illness do not include fever, fatigue, shortness of breath, nausea, vomiting, diarrhea, hematemesis, hematochezia or dysuria. The current episode started more than 2 days ago. The onset of the illness was gradual. The problem has not changed since onset. The illness is associated with eating. The patient states that she believes she is currently not pregnant. The patient has had a change in bowel habit. Additional symptoms associated with the illness include anorexia and constipation. Symptoms associated with the illness do not include diaphoresis, heartburn, frequency or back pain. Significant associated medical issues include GERD.    Past Medical History  Diagnosis Date  . PONV (postoperative nausea and vomiting)     pt states has not had ponv but has been treated in past with scopolamine, does admit to history of motion sickness, history of vertigo  . Hypertension     controlled on hctz-bp at pat 145/94  . GERD (gastroesophageal reflux disease)     prilosec otc-instructed to take dos  . Anxiety     xanax prn  . Vertigo     recent diagnosis of vertigo-uses otc meclizine    Past Surgical History  Procedure Date  .  Ectopic pregnancy surgery     2002  . Laparoscopy     2006-for adhesions  . Breast reduction surgery     2010  . Laparoscopic assisted vaginal hysterectomy 06/14/2011    Procedure: LAPAROSCOPIC ASSISTED VAGINAL HYSTERECTOMY;  Surgeon: Luz Lex, MD;  Location: [facility];  Service: Gynecology;  Laterality: N/A;  Abdomen and vagina    History reviewed. No pertinent family history.  History  Substance Use Topics  . Smoking status: Never Smoker   . Smokeless tobacco: Not on file  . Alcohol Use: No    OB History    Grav Para Term Preterm Abortions TAB SAB Ect Mult Living                  Review of Systems  Constitutional: Negative for fever, diaphoresis and fatigue.  Respiratory: Negative for shortness of breath.   Gastrointestinal: Positive for abdominal pain, constipation and anorexia. Negative for heartburn, nausea, vomiting, diarrhea, hematochezia, abdominal distention and hematemesis.  Genitourinary: Negative for dysuria and frequency.  Musculoskeletal: Negative for back pain.    Allergies  Hydromorphone and Ciprofloxacin  Home Medications   Current Outpatient Rx  Name Route Sig Dispense Refill  . HYDROCHLOROTHIAZIDE 12.5 MG PO CAPS Oral Take 12.5 mg by mouth daily.      [name] PRILOSEC OTC PO Oral Take 1 tablet by mouth daily.      [name] BISACODYL 5 MG PO TBEC Oral Take 1 tablet (5 mg total) by mouth 2 (two) times daily. X 5 days 14 tablet 0  .  IBUPROFEN 200 MG PO TABS Oral Take 200-400 mg by mouth daily as needed. For pain      . METOCLOPRAMIDE HCL 10 MG PO TABS Oral Take 1 tablet (10 mg total) by mouth 3 (three) times daily before meals. 10 tablet 0  . NAPROXEN SODIUM 220 MG PO TABS Oral Take 220-440 mg by mouth daily as needed. For pain      . POLYETHYLENE GLYCOL 3350 PO PACK Oral Take 17 g by mouth 2 (two) times daily. X 4 days 14 each 0    BP 145/96  Pulse 98  Temp(Src) 99.4 F (37.4 C) (Oral)  Resp 16  SpO2 97%  LMP 05/01/2011  Physical Exam  Nursing note and  vitals reviewed. Constitutional: She appears well-developed and well-nourished. No distress.  HENT:  Head: Normocephalic.  Eyes: Conjunctivae are normal. No scleral icterus.  Pulmonary/Chest: Effort normal and breath sounds normal.  Abdominal: Soft. She exhibits no distension and no mass. There is no hepatosplenomegaly. There is tenderness in the epigastric area and periumbilical area. There is no rigidity, no rebound, no guarding, no CVA tenderness, no tenderness at McBurney's point and negative Murphy's sign. No hernia.  Neurological: She is alert.  Skin: Skin is warm.    ED Course  Procedures (including critical care time)  Labs Reviewed - No data to display No results found.   1. Abdominal pain       MDM  Abdominal discomfort- soft abdomen - afebrile. Describes poor bowel movements "small and hard". Postprandial sensation of bloating.        Rosana Hoes, MD 12/04/11 [phone]

## 2012-12-22 ENCOUNTER — Emergency Department (INDEPENDENT_AMBULATORY_CARE_PROVIDER_SITE_OTHER)
Admission: EM | Admit: 2012-12-22 | Discharge: 2012-12-22 | Disposition: A | Discharge status: Home / Self Care | Payer: BC Managed Care – PPO | Source: Home / Self Care | Attending: Emergency Medicine | Admitting: Emergency Medicine

## 2012-12-22 ENCOUNTER — Encounter (HOSPITAL_COMMUNITY): Payer: Self-pay

## 2012-12-22 ENCOUNTER — Emergency Department (INDEPENDENT_AMBULATORY_CARE_PROVIDER_SITE_OTHER): Payer: BC Managed Care – PPO

## 2012-12-22 DIAGNOSIS — J111 Influenza due to unidentified influenza virus with other respiratory manifestations: Secondary | ICD-10-CM

## 2012-12-22 DIAGNOSIS — L719 Rosacea, unspecified: Secondary | ICD-10-CM

## 2012-12-22 MED ORDER — BENZONATATE 200 MG PO CAPS: 200.0000 mg | ORAL_CAPSULE | Freq: Three times a day (TID) | ORAL | Status: DC | PRN

## 2012-12-22 MED ORDER — METRONIDAZOLE 0.75 % EX GEL: Freq: Two times a day (BID) | CUTANEOUS | Status: DC

## 2012-12-22 MED ORDER — OSELTAMIVIR PHOSPHATE 75 MG PO CAPS: 75.0000 mg | ORAL_CAPSULE | Freq: Two times a day (BID) | ORAL | Status: DC

## 2012-12-22 NOTE — ED Provider Notes (Signed)
Chief Complaint  Patient presents with  . 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 Today when she woke up she had redness of her cheeks extending to the perioral area and to the nose. This was slightly swollen. Worse in the right than the left. It was not painful or tender. She also had nasal congestion and rhinorrhea with clear drainage. Over the past 2 weeks she's had numbness and tingling restless feeling in her arms and legs. She was seen by her primary care physician in [place] and referred to a neurologist. She also has fibromyalgia and sees a rheumatologist for that who prescribes Xanax. She had a history of high blood pressure but is off of the medication for that.  Review of Systems:  Other than noted above, the patient denies any of the following symptoms. Systemic:  No fever, chills, sweats, fatigue, myalgias, headache, or anorexia. Eye:  No redness, pain or drainage. ENT:  No earache, ear congestion, nasal congestion, sneezing, rhinorrhea, sinus pressure, sinus pain, post nasal drip, or sore throat. Lungs:  No cough, sputum production, wheezing, shortness of breath, or chest pain. GI:  No abdominal pain, nausea, vomiting, or diarrhea.  Lookout:  Past medical history, family history, social history, meds, and allergies were reviewed.  Physical Exam:   Vital signs:  BP 153/110  Pulse 120  Temp 99.5 F (37.5 C) (Oral)  Resp 24  SpO2 97%  LMP 05/01/2011 General:  Alert, in no distress. Eye:  No conjunctival injection or drainage. Lids were normal. ENT:  TMs and canals were normal, without erythema or inflammation.  Nasal mucosa was clear and uncongested, without drainage.  Mucous membranes were moist.  Pharynx was clear, without exudate or drainage.  There were no oral ulcerations or  lesions. Neck:  Supple, no adenopathy, tenderness or mass. Lungs:  No respiratory distress.  Lungs were clear to auscultation, without wheezes, rales or rhonchi.  Breath sounds were clear and equal bilaterally.  Heart:  Regular rhythm, without gallops, murmers or rubs. Skin:  She has erythema of both cheeks with some maculopapules, this extends to the perioral area and the bridge of the nose.  Radiology:  Dg Chest 2 View  12/22/2012  *RADIOLOGY REPORT*  Clinical Data: Cough and fever.  CHEST - 2 VIEW  Comparison: 09/05/2006.  Findings: The cardiac silhouette, mediastinal and hilar contours are within normal limits and stable.  The lungs are clear.  No pleural effusion.  The bony thorax is intact.  IMPRESSION: No acute cardiopulmonary findings.   Original Report Authenticated By: Marijo Sanes, M.D.    I reviewed the images independently and personally and concur with the radiologist's findings.  Assessment:  The primary encounter diagnosis was Influenza-like illness. A diagnosis of Rosacea was also pertinent to this visit.  Differential diagnosis for the rash includes lupus or rosacea. For right now we'll treat with rosacea and if it doesn't get better I suggested that she have a test for lupus. Also her blood pressure is elevated and I suggested that she followup with her primary care physician about this.  Plan:   1.  The following meds were prescribed:   New Prescriptions   BENZONATATE (TESSALON) 200 MG CAPSULE    Take 1 capsule (200 mg total) by mouth 3 (three) times daily as needed for cough.  METRONIDAZOLE (METROGEL) 0.75 % GEL    Apply topically 2 (two) times daily.   OSELTAMIVIR (TAMIFLU) 75 MG CAPSULE    Take 1 capsule (75 mg total) by mouth every 12 (twelve) hours.   2.  The patient was instructed in symptomatic care and handouts were given. 3.  The patient was told to return if becoming worse in any way, if no better in 3 or 4 days, and given some red flag symptoms that would  indicate earlier return.   Harden Mo, MD 12/22/12 [phone]

## 2012-12-22 NOTE — ED Notes (Signed)
Reports she has been having cough, fever body aches since Friday , numbness/burning/tingling in arms, legs restless last night; face reddened (not normal for her)

## 2013-01-05 ENCOUNTER — Emergency Department (HOSPITAL_COMMUNITY): Payer: BC Managed Care – PPO

## 2013-01-05 ENCOUNTER — Encounter (HOSPITAL_COMMUNITY): Payer: Self-pay | Admitting: Emergency Medicine

## 2013-01-05 ENCOUNTER — Emergency Department (HOSPITAL_COMMUNITY)
Admission: EM | Admit: 2013-01-05 | Discharge: 2013-01-05 | Disposition: A | Discharge status: Home / Self Care | Payer: BC Managed Care – PPO | Attending: Emergency Medicine | Admitting: Emergency Medicine

## 2013-01-05 DIAGNOSIS — R6883 Chills (without fever): Secondary | ICD-10-CM | POA: Insufficient documentation

## 2013-01-05 DIAGNOSIS — K219 Gastro-esophageal reflux disease without esophagitis: Secondary | ICD-10-CM | POA: Insufficient documentation

## 2013-01-05 DIAGNOSIS — I10 Essential (primary) hypertension: Secondary | ICD-10-CM | POA: Insufficient documentation

## 2013-01-05 DIAGNOSIS — Z8669 Personal history of other diseases of the nervous system and sense organs: Secondary | ICD-10-CM | POA: Insufficient documentation

## 2013-01-05 DIAGNOSIS — F411 Generalized anxiety disorder: Secondary | ICD-10-CM | POA: Insufficient documentation

## 2013-01-05 DIAGNOSIS — K2960 Other gastritis without bleeding: Secondary | ICD-10-CM

## 2013-01-05 DIAGNOSIS — R079 Chest pain, unspecified: Secondary | ICD-10-CM | POA: Insufficient documentation

## 2013-01-05 DIAGNOSIS — F419 Anxiety disorder, unspecified: Secondary | ICD-10-CM

## 2013-01-05 DIAGNOSIS — R112 Nausea with vomiting, unspecified: Secondary | ICD-10-CM | POA: Insufficient documentation

## 2013-01-05 DIAGNOSIS — Z90710 Acquired absence of both cervix and uterus: Secondary | ICD-10-CM | POA: Insufficient documentation

## 2013-01-05 DIAGNOSIS — R42 Dizziness and giddiness: Secondary | ICD-10-CM | POA: Insufficient documentation

## 2013-01-05 DIAGNOSIS — Z79899 Other long term (current) drug therapy: Secondary | ICD-10-CM | POA: Insufficient documentation

## 2013-01-05 LAB — HEPATIC FUNCTION PANEL
AST: 22 U/L (ref 0–37)
Alkaline Phosphatase: 90 U/L (ref 39–117)
Total Bilirubin: 0.5 mg/dL (ref 0.3–1.2)
Total Protein: 7.5 g/dL (ref 6.0–8.3)

## 2013-01-05 LAB — BASIC METABOLIC PANEL
Calcium: 9.2 mg/dL (ref 8.4–10.5)
Chloride: 100 mEq/L (ref 96–112)
Creatinine, Ser: 0.61 mg/dL (ref 0.50–1.10)
GFR calc Af Amer: >90 mL/min (ref >90)
Glucose, Bld: 118 mg/dL — ABNORMAL HIGH (ref 70–99)
Potassium: 4 mEq/L (ref 3.5–5.1)
Sodium: 137 mEq/L (ref 135–145)

## 2013-01-05 LAB — CBC WITH DIFFERENTIAL/PLATELET
Basophils Absolute: 0 10*3/uL (ref 0.0–0.1)
Basophils Relative: 0 % (ref 0–1)
HCT: 47.5 % — ABNORMAL HIGH (ref 36.0–46.0)
MCHC: 34.5 g/dL (ref 30.0–36.0)
Monocytes Absolute: 0.5 10*3/uL (ref 0.1–1.0)
Neutro Abs: 9.3 10*3/uL — ABNORMAL HIGH (ref 1.7–7.7)
Neutrophils Relative %: 90 % — ABNORMAL HIGH (ref 43–77)
Platelets: 222 10*3/uL (ref 150–400)
RDW: 13.5 % (ref 11.5–15.5)
WBC: 10.4 10*3/uL (ref 4.0–10.5)

## 2013-01-05 LAB — POCT I-STAT TROPONIN I: Troponin i, poc: 0.01 ng/mL (ref 0.00–0.08)

## 2013-01-05 LAB — LIPASE, BLOOD: Lipase: 21 U/L (ref 11–59)

## 2013-01-05 MED ORDER — PANTOPRAZOLE SODIUM 40 MG IV SOLR
40.0000 mg | Freq: Once | INTRAVENOUS | Status: AC
  Administered 2013-01-05: 40 mg via INTRAVENOUS
  Filled 2013-01-05: qty 40

## 2013-01-05 MED ORDER — OMEPRAZOLE 20 MG PO CPDR: 40.0000 mg | DELAYED_RELEASE_CAPSULE | Freq: Every day | ORAL | Status: DC

## 2013-01-05 MED ORDER — ONDANSETRON HCL 4 MG/2ML IJ SOLN
4.0000 mg | Freq: Once | INTRAMUSCULAR | Status: AC
  Administered 2013-01-05: 4 mg via INTRAVENOUS
  Filled 2013-01-05: qty 2

## 2013-01-05 MED ORDER — GI COCKTAIL ~~LOC~~
30.0000 mL | Freq: Once | ORAL | Status: AC
  Administered 2013-01-05: 30 mL via ORAL
  Filled 2013-01-05: qty 30

## 2013-01-05 MED ORDER — SODIUM CHLORIDE 0.9 % IV BOLUS (SEPSIS)
1000.0000 mL | Freq: Once | INTRAVENOUS | Status: AC
  Administered 2013-01-05: 1000 mL via INTRAVENOUS

## 2013-01-05 MED ORDER — SUCRALFATE 1 GM/10ML PO SUSP: 1.0000 g | Freq: Four times a day (QID) | ORAL | Status: DC

## 2013-01-05 MED ORDER — LORAZEPAM 2 MG/ML IJ SOLN
0.5000 mg | Freq: Once | INTRAMUSCULAR | Status: AC
  Administered 2013-01-05: 0.5 mg via INTRAVENOUS
  Filled 2013-01-05: qty 1

## 2013-01-05 NOTE — ED Provider Notes (Signed)
History  This chart was scribed for Amy Rice, MD by Roe Coombs, ED Scribe. The patient was seen in room CD11C/CD11C. Patient's care was started at 1209.   CSN: [id number]  Arrival date & time 01/05/13  1108   First MD Initiated Contact with Patient 01/05/13 1209      Chief Complaint  Patient presents with  . Chest Pain  . Abdominal Pain     Patient is a 47 y.o. female presenting with abdominal pain. The history is provided by the patient. No language interpreter was used.  Abdominal Pain The primary symptoms of the illness include abdominal pain, nausea and vomiting. The current episode started 6 to 12 hours ago. The onset of the illness was sudden. The problem has been gradually improving.  The abdominal pain began 6 to 12 hours ago. The pain came on suddenly. The abdominal pain has been gradually improving since its onset. The abdominal pain is located in the epigastric region. The abdominal pain radiates to the chest. The abdominal pain is relieved by nothing.  Nausea began today.  The vomiting began today. Vomiting occurs 2 to 5 times per day. The emesis contains stomach contents.  Significant associated medical issues include GERD.    HPI Comments: Amy [name] is a 47 y.o. female with history of GERD who presents to the Emergency Department complaining of gradually improving, moderate, constant, burning, epigastric abdominal pain that radiates up to chest onset 7 hours ago. There is associated nausea, emesis, chills and dizziness. Patient reports 3 episodes of vomiting since symptoms began. Patient denies fever, bloody stools, hematemesis or dysuria. Patient says that she ate a fish sandwich from [employer] last night for dinner. She states that she hasn't taken ibuprofen or naproxen in the past several days. She took 1 Prilosec this morning. Patient states a history of anxiety and that she was prescribed a low dosage of Xanax, but she doesn't take it regularly. Patient  hasn't taken any long trips recently. She has a surgical history of hysterectomy (1.5 years ago), but no recent abdominal surgeries. Patient is not on any birth control.    Past Medical History  Diagnosis Date  . PONV (postoperative nausea and vomiting)     pt states has not had ponv but has been treated in past with scopolamine, does admit to history of motion sickness, history of vertigo  . Hypertension     controlled on hctz-bp at pat 145/94  . GERD (gastroesophageal reflux disease)     prilosec otc-instructed to take dos  . Anxiety     xanax prn  . Vertigo     recent diagnosis of vertigo-uses otc meclizine    Past Surgical History  Procedure Date  . Ectopic pregnancy surgery     2002  . Laparoscopy     2006-for adhesions  . Breast reduction surgery     2010  . Laparoscopic assisted vaginal hysterectomy 06/14/2011    Procedure: LAPAROSCOPIC ASSISTED VAGINAL HYSTERECTOMY;  Surgeon: Luz Lex, MD;  Location: [facility];  Service: Gynecology;  Laterality: N/A;  Abdomen and vagina    No family history on file.  History  Substance Use Topics  . Smoking status: Never Smoker   . Smokeless tobacco: Not on file  . Alcohol Use: No    OB History    Grav Para Term Preterm Abortions TAB SAB Ect Mult Living                  Review of Systems  Cardiovascular: Positive for chest pain.  Gastrointestinal: Positive for nausea, vomiting and abdominal pain. Negative for blood in stool.  All other systems reviewed and are negative.    Allergies  Hydromorphone and Ciprofloxacin  Home Medications   Current Outpatient Rx  Name  Route  Sig  Dispense  Refill  . ALPRAZOLAM 0.25 MG PO TABS   Oral   Take 0.25 mg by mouth 2 (two) times daily as needed. For anxiety         . OMEPRAZOLE 20 MG PO CPDR   Oral   Take 2 capsules (40 mg total) by mouth daily.   30 capsule   0   . SUCRALFATE 1 GM/10ML PO SUSP   Oral   Take 10 mLs (1 g total) by mouth 4 (four) times daily.   420  mL   0     Triage Vitals: BP 144/84  Pulse 111  Temp 98.2 F (36.8 C) (Oral)  Resp 18  SpO2 97%  LMP 05/01/2011  Physical Exam  Constitutional: She is oriented to person, place, and time. She appears well-developed and well-nourished. No distress.  HENT:  Head: Normocephalic and atraumatic.  Mouth/Throat: Oropharynx is clear and moist.  Eyes: Conjunctivae normal and EOM are normal. Pupils are equal, round, and reactive to light.  Neck: Normal range of motion. Neck supple.  Cardiovascular: Regular rhythm.        HR fluctuating between 110s and 140s.  Pulmonary/Chest: Effort normal and breath sounds normal. No respiratory distress. She has no wheezes. She has no rales.  Abdominal: Soft. Normal appearance and bowel sounds are normal. She exhibits no distension. There is tenderness in the epigastric area. There is no rebound and no guarding.       Mild epigastric tenderness to palpation.  Musculoskeletal: Normal range of motion.       Calves are nontender. No calf swelling.  Neurological: She is alert and oriented to person, place, and time.  Skin: Skin is warm and dry.  Psychiatric:       Quite anxious.    ED Course  Procedures (including critical care time) DIAGNOSTIC STUDIES: Oxygen Saturation is 97% on room air, adequate by my interpretation.    COORDINATION OF CARE: 12:17 PM- Patient informed of current plan for treatment and evaluation and agrees with plan at this time. Will give IV fluids, GI contactail, Protonix and Zofran.  2:18 PM- Patient states that abdominal pain is improved. Waiting for remaining fluids to run through.  Patient is still incredibly anxious, will give ativan 0.5 mg.   3:51 PM- Patient is feeling better. HR is improved. Patient stable for discharge. Agrees with plan.     Labs Reviewed  CBC WITH DIFFERENTIAL - Abnormal; Notable for the following:    RBC 5.72 (*)     Hemoglobin 16.4 (*)     HCT 47.5 (*)     Neutrophils Relative 90 (*)      Neutro Abs 9.3 (*)     Lymphocytes Relative 5 (*)     Lymphs Abs 0.6 (*)     All other components within normal limits  BASIC METABOLIC PANEL - Abnormal; Notable for the following:    Glucose, Bld 118 (*)     All other components within normal limits  POCT I-STAT TROPONIN I  LIPASE, BLOOD  HEPATIC FUNCTION PANEL    Dg Chest 2 View  01/05/2013  *RADIOLOGY REPORT*  Clinical Data: Nausea, vomiting, chest pain.  CHEST - 2 VIEW  Comparison:  12/22/2012  Findings: Relatively low lung volumes with resultant crowding of bronchovascular structures. Lungs clear.  Heart size and pulmonary vascularity normal.  No effusion.  Visualized bones unremarkable.  IMPRESSION: No acute disease   Original Report Authenticated By: D. Wallace Going, MD      1. Reflux gastritis   2. Anxiety       MDM  I personally performed the services described in this documentation, which was scribed in my presence. The recorded information has been reviewed and is accurate.    Amy Rice, MD 01/05/13 [phone]

## 2013-01-05 NOTE — ED Notes (Signed)
IV is positional.  Repositioned and redressed.

## 2013-01-05 NOTE — ED Notes (Signed)
Onset today 0500 woke up with nausea general abdominal pain and chest pain.  Pain is pressure 3-4/10. Diagnosed with flu 2 weeks ago.

## 2013-01-05 NOTE — ED Notes (Signed)
Pt with bad gag reflex and is heaving as she is attempting to drink the gi cocktail.  Pt not tolerating and requesting an emesis bag.

## 2013-01-18 ENCOUNTER — Other Ambulatory Visit: Payer: Self-pay

## 2013-04-10 ENCOUNTER — Ambulatory Visit: Payer: Self-pay | Admitting: Adult Health

## 2013-04-18 ENCOUNTER — Ambulatory Visit: Payer: Self-pay | Admitting: Nurse Practitioner

## 2013-08-12 ENCOUNTER — Other Ambulatory Visit: Payer: Self-pay | Admitting: Gastroenterology

## 2013-08-12 DIAGNOSIS — R1013 Epigastric pain: Secondary | ICD-10-CM

## 2013-08-13 ENCOUNTER — Ambulatory Visit (HOSPITAL_COMMUNITY)
Admission: RE | Admit: 2013-08-13 | Discharge: 2013-08-13 | Disposition: A | Discharge status: Home / Self Care | Payer: BC Managed Care – PPO | Source: Ambulatory Visit | Attending: Gastroenterology | Admitting: Gastroenterology

## 2013-08-13 DIAGNOSIS — R1013 Epigastric pain: Secondary | ICD-10-CM

## 2013-08-15 ENCOUNTER — Ambulatory Visit (HOSPITAL_COMMUNITY)
Admission: RE | Admit: 2013-08-15 | Discharge: 2013-08-15 | Disposition: A | Discharge status: Home / Self Care | Payer: BC Managed Care – PPO | Source: Ambulatory Visit | Attending: Gastroenterology | Admitting: Gastroenterology

## 2013-08-15 DIAGNOSIS — R1013 Epigastric pain: Secondary | ICD-10-CM | POA: Insufficient documentation

## 2013-08-15 MED ORDER — TECHNETIUM TC 99M MEBROFENIN IV KIT
5.0000 | PACK | Freq: Once | INTRAVENOUS | Status: AC | PRN
  Administered 2013-08-15: 5 via INTRAVENOUS

## 2013-08-19 ENCOUNTER — Other Ambulatory Visit (HOSPITAL_COMMUNITY): Payer: BC Managed Care – PPO

## 2013-08-20 ENCOUNTER — Ambulatory Visit (INDEPENDENT_AMBULATORY_CARE_PROVIDER_SITE_OTHER): Payer: BLUE CROSS/BLUE SHIELD | Admitting: General Surgery

## 2013-08-20 ENCOUNTER — Encounter (INDEPENDENT_AMBULATORY_CARE_PROVIDER_SITE_OTHER): Payer: Self-pay | Admitting: General Surgery

## 2013-08-20 ENCOUNTER — Encounter (HOSPITAL_COMMUNITY): Payer: BC Managed Care – PPO

## 2013-08-20 VITALS — BP 132/78 | HR 68 | Temp 98.1°F | Resp 14 | Ht 65.5 in | Wt 194.8 lb

## 2013-08-20 DIAGNOSIS — R1013 Epigastric pain: Secondary | ICD-10-CM

## 2013-08-20 NOTE — Patient Instructions (Addendum)
Call as needed for any questions or if you begin to have worsening episodes of abdominal pain.

## 2013-08-20 NOTE — Progress Notes (Signed)
Subjective:   Abdominal pain.  Patient ID: Amy [name], female   DOB: [birth year], 47 y.o.   MRN: [id number]  HPI Patient is a pleasant 47 year old female referred by Dr. Collene Mares for abdominal discomfort and possible gallbladder disease. The patient has a history for a number of years of intermittent upper abdominal bloating and gas related to eating heavy meals but this has never really approached pain. She also has some occasional mild heartburn symptoms controlled with Prilosec. However earlier this week she awoke with more severe burning discomfort in her epigastrium radiating up into her chest. This was bad enough that she did not go to work and presented later on to the emergency department. I reviewed these nodes. At that time the symptoms were resolving and she was given a GI cocktail and all symptoms resolved. There was no nausea or vomiting. Since that time she has felt essentially well. As further evaluation through Dr. Lorie Apley office abdominal ultrasound and high the scan were obtained which are reviewed. Her ultrasound is normal with no gallstones or gallbladder wall thickening. Lab work at Dr. Lorie Apley was negative per the patient. Her HIDA scan showed Good visualization of the gallbladder and emptying into the duodenum with a slightly low ejection fraction of 32% with normal being 33%. Again at this point she is feeling well. Also noted on her ultrasound with some steatosis of the liver.  Past Medical History  Diagnosis Date  . PONV (postoperative nausea and vomiting)     pt states has not had ponv but has been treated in past with scopolamine, does admit to history of motion sickness, history of vertigo  . Hypertension     controlled on hctz-bp at pat 145/94  . GERD (gastroesophageal reflux disease)     prilosec otc-instructed to take dos  . Anxiety     xanax prn  . Vertigo     recent diagnosis of vertigo-uses otc meclizine  . Anemia   . Blood transfusion without reported diagnosis    . Neuromuscular disorder     firbrmylagia   Past Surgical History  Procedure Laterality Date  . Ectopic pregnancy surgery      2002  . Laparoscopy      2006-for adhesions  . Breast reduction surgery      2010  . Laparoscopic assisted vaginal hysterectomy  06/14/2011    Procedure: LAPAROSCOPIC ASSISTED VAGINAL HYSTERECTOMY;  Surgeon: Luz Lex, MD;  Location: [facility];  Service: Gynecology;  Laterality: N/A;  Abdomen and vagina   Current Outpatient Prescriptions  Medication Sig Dispense Refill  . ALPRAZolam (XANAX) 0.25 MG tablet Take 0.25 mg by mouth 2 (two) times daily as needed. For anxiety      . omeprazole (PRILOSEC) 20 MG capsule Take 2 capsules (40 mg total) by mouth daily.  30 capsule  0  . hydrochlorothiazide (MICROZIDE) 12.5 MG capsule        No current facility-administered medications for this visit.   Allergies  Allergen Reactions  . Hydromorphone Nausea And Vomiting  . Ciprofloxacin Rash   History  Substance Use Topics  . Smoking status: Never Smoker   . Smokeless tobacco: Never Used  . Alcohol Use: No     Review of Systems  Constitutional: Negative.   Respiratory: Negative.   Cardiovascular: Negative.   Gastrointestinal: Positive for abdominal pain and abdominal distention. Negative for nausea and vomiting.  Genitourinary: Negative.        Objective:   Physical Exam BP 132/78  Pulse 68  Temp(Src) 98.1 F (36.7 C) (Temporal)  Resp 14  Ht 5' 5.5" (1.664 m)  Wt 194 lb 12.8 oz (88.361 kg)  BMI 31.91 kg/m2  LMP 05/01/2011 General: Alert, Mildly obese Caucasian female, in no distress Skin: Warm and dry without rash or infection. HEENT: No palpable masses or thyromegaly. Sclera nonicteric. Pupils equal round and reactive. Oropharynx clear. Lymph nodes: No cervical, supraclavicular, or inguinal nodes palpable. Lungs: Breath sounds clear and equal without increased work of breathing Cardiovascular: Regular rate and rhythm without murmur. No JVD or  edema. Peripheral pulses intact. Abdomen: Nondistended. Soft and nontender. No masses palpable. No organomegaly. No palpable hernias. Extremities: No edema or joint swelling or deformity. No chronic venous stasis changes. Neurologic: Alert and fully oriented. Gait normal.    Assessment:     47 year old female with recent episode of epigastric pain radiating to chest, burning in character. She has history of mild chronic reflux. She has had no further episodes of pain. Gallbladder workup is essentially negative except for a borderline ejection fraction. Also noted steatosis of the liver on her ultrasound. At this point with 1 episode of pain and feeling well and essentially negative workup I would not recommend cholecystectomy. This was discussed with Dr. Collene Mares as well who is in agreement. We are advising the patient that if she begins to have recurring episodes of similar pain she should undergo endoscopy to rule out is of esophagitis or ulcer disease. If this were negative and she was continuing to have worsening pain we might then consider cholecystectomy for presumed biliary dyskinesia. We also discussed hepatic steatosis and its implications and she is encouraged to continue a low-fat diet and weight control. I will see her back as needed.  She was very happy and comfortable with this plan.    Plan:     As above

## 2013-10-09 ENCOUNTER — Other Ambulatory Visit: Payer: Self-pay

## 2013-12-01 ENCOUNTER — Encounter (HOSPITAL_COMMUNITY): Payer: Self-pay | Admitting: Emergency Medicine

## 2013-12-01 ENCOUNTER — Emergency Department (HOSPITAL_COMMUNITY)
Admission: EM | Admit: 2013-12-01 | Discharge: 2013-12-01 | Disposition: A | Discharge status: Home / Self Care | Payer: BC Managed Care – PPO | Source: Home / Self Care | Attending: Emergency Medicine | Admitting: Emergency Medicine

## 2013-12-01 DIAGNOSIS — M549 Dorsalgia, unspecified: Secondary | ICD-10-CM

## 2013-12-01 DIAGNOSIS — T148XXA Other injury of unspecified body region, initial encounter: Secondary | ICD-10-CM

## 2013-12-01 DIAGNOSIS — S39012A Strain of muscle, fascia and tendon of lower back, initial encounter: Secondary | ICD-10-CM

## 2013-12-01 DIAGNOSIS — S335XXA Sprain of ligaments of lumbar spine, initial encounter: Secondary | ICD-10-CM

## 2013-12-01 MED ORDER — DICLOFENAC SODIUM 50 MG PO TBEC: DELAYED_RELEASE_TABLET | ORAL | Status: DC

## 2013-12-01 MED ORDER — CYCLOBENZAPRINE HCL 5 MG PO TABS: 5.0000 mg | ORAL_TABLET | Freq: Three times a day (TID) | ORAL | Status: DC | PRN

## 2013-12-01 MED ORDER — TRAMADOL HCL 50 MG PO TABS: 50.0000 mg | ORAL_TABLET | Freq: Four times a day (QID) | ORAL | Status: DC | PRN

## 2013-12-01 NOTE — ED Notes (Signed)
Patient requesting work note--locating david mabe np to discuss.

## 2013-12-01 NOTE — ED Provider Notes (Signed)
CSN: [id number]     Arrival date & time 12/01/13  1124 History   First MD Initiated Contact with Patient 12/01/13 1308     Chief Complaint  Patient presents with  . Back Pain   (Consider location/radiation/quality/duration/timing/severity/associated sxs/prior Treatment) HPI Comments: 47 year old obese female complaining of back pain for 2-3 days. Is episodic. Pain is located across the paralumbar musculature. It is worse with most movements, bending, lifting standing from a seated position. She experiences stiffness when remains in one position for a period of time and then tries to move. There is no radicular pain or paresthesias. 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 No history of trauma or event that may have caused  the pain.   Past Medical History  Diagnosis Date  . PONV (postoperative nausea and vomiting)     pt states has not had ponv but has been treated in past with scopolamine, does admit to history of motion sickness, history of vertigo  . Hypertension     controlled on hctz-bp at pat 145/94  . GERD (gastroesophageal reflux disease)     prilosec otc-instructed to take dos  . Anxiety     xanax prn  . Vertigo     recent diagnosis of vertigo-uses otc meclizine  . Anemia   . Blood transfusion without reported diagnosis   . Neuromuscular disorder     firbrmylagia   Past Surgical History  Procedure Laterality Date  . Ectopic pregnancy surgery      2002  . Laparoscopy      2006-for adhesions  . Breast reduction surgery      2010  . Laparoscopic assisted vaginal hysterectomy  06/14/2011    Procedure: LAPAROSCOPIC ASSISTED VAGINAL HYSTERECTOMY;  Surgeon: Luz Lex, MD;  Location: [facility];  Service: Gynecology;  Laterality: N/A;  Abdomen and vagina   Family History  Problem Relation Age of Onset  . Cancer Paternal Uncle     liver   . Cancer Maternal Grandmother      throat   History  Substance Use Topics  . Smoking status: Never Smoker   . Smokeless tobacco: Never Used  . Alcohol Use: No   OB History   Grav Para Term Preterm Abortions TAB SAB Ect Mult Living                 Review of Systems  Constitutional: Positive for activity change. Negative for fever and chills.  HENT: Negative.   Respiratory: Negative.   Cardiovascular: Negative.   Musculoskeletal: Positive for back pain and myalgias. Negative for neck stiffness.       As per HPI  Skin: Negative for color change, pallor and rash.  Neurological: Negative.     Allergies  Hydromorphone and Ciprofloxacin  Home Medications   Current Outpatient Rx  Name  Route  Sig  Dispense  Refill  . ALPRAZolam (XANAX) 0.25 MG tablet   Oral   Take 0.25 mg by mouth 2 (two) times daily as needed. For anxiety         . cyclobenzaprine (FLEXERIL) 5 MG tablet   Oral   Take 1 tablet (5 mg total) by mouth 3 (three) times daily as needed for muscle spasms.   21 tablet   0   . diclofenac (VOLTAREN) 50 MG EC tablet      1 tap po q 8 hours prn pain.  Take wit food.   21 tablet   0   . hydrochlorothiazide (MICROZIDE) 12.5 MG capsule               . omeprazole (PRILOSEC) 20 MG capsule   Oral   Take 2 capsules (40 mg total) by mouth daily.   30 capsule   0   . traMADol (ULTRAM) 50 MG tablet   Oral   Take 1 tablet (50 mg total) by mouth every 6 (six) hours as needed.   15 tablet   0    BP 149/98  Pulse 83  Temp(Src) 98.3 F (36.8 C) (Oral)  Resp 16  Ht 5' 5.5" (1.664 m)  Wt 205 lb (92.987 kg)  BMI 33.58 kg/m2  SpO2 97%  LMP 05/01/2011 Physical Exam  Nursing note and vitals reviewed. Constitutional: She is oriented to person, place, and time. She appears well-developed and well-nourished. No distress.  HENT:  Head: Normocephalic and atraumatic.  Eyes: EOM are normal.  Neck: Normal range of motion. Neck supple.  Cardiovascular: Normal rate.   Pulmonary/Chest: Effort  normal and breath sounds normal. No respiratory distress. She has no wheezes.  Musculoskeletal: She exhibits no edema.  Tenderness across the lower para lumbar musculature. Patient able to flex to 45 until pain limits additional flexion. No spinal tenderness or deformity. No sign of scoliosis. Distal neurovascular motor sensory is intact.  Neurological: She is alert and oriented to person, place, and time. No cranial nerve deficit. She exhibits normal muscle tone.  Skin: Skin is warm and dry.  Psychiatric: She has a normal mood and affect.    ED Course  Procedures (including critical care time) Labs Review Labs Reviewed - No data to display Imaging Review No results found.    MDM   1. Lumbar strain, initial encounter   2. Back pain   3. Muscle strain       Apply heat to the areas of soreness. Tramadol as directed, diclofenac as directed and Flexeril as directed. No lifting, bending or twisting until pain subsides. Perform stretches as demonstrated and discussed. Some of these medicines can cause drowsiness and taken together and with Xanax. Of your PCP as needed.     Janne Napoleon, NP 12/01/13 1349

## 2013-12-01 NOTE — ED Notes (Signed)
C/o back pain for 3 days.  Chronic back pain, worsens at times, particularly the last 3 days.  C/o lower back , bil hip pain.  Patient cannot determine if fibromyalgia pain or back pain related

## 2013-12-02 NOTE — ED Provider Notes (Signed)
Medical screening examination/treatment/procedure(s) were performed by non-physician practitioner and as supervising physician I was immediately available for consultation/collaboration.  Philipp Deputy, M.D.  Harden Mo, MD 12/02/13 [phone]

## 2014-02-19 ENCOUNTER — Encounter: Payer: Self-pay | Admitting: Adult Health

## 2014-02-19 ENCOUNTER — Ambulatory Visit (INDEPENDENT_AMBULATORY_CARE_PROVIDER_SITE_OTHER): Payer: BC Managed Care – PPO | Admitting: Adult Health

## 2014-02-19 VITALS — BP 120/74 | HR 78 | Ht 64.0 in | Wt 219.0 lb

## 2014-02-19 DIAGNOSIS — R635 Abnormal weight gain: Secondary | ICD-10-CM

## 2014-02-19 DIAGNOSIS — Z01419 Encounter for gynecological examination (general) (routine) without abnormal findings: Secondary | ICD-10-CM

## 2014-02-19 DIAGNOSIS — R232 Flushing: Secondary | ICD-10-CM

## 2014-02-19 DIAGNOSIS — K648 Other hemorrhoids: Secondary | ICD-10-CM | POA: Insufficient documentation

## 2014-02-19 DIAGNOSIS — Z1212 Encounter for screening for malignant neoplasm of rectum: Secondary | ICD-10-CM

## 2014-02-19 HISTORY — DX: Flushing: R23.2

## 2014-02-19 HISTORY — DX: Abnormal weight gain: R63.5

## 2014-02-19 LAB — HEMOCCULT GUIAC POC 1CARD (OFFICE): Fecal Occult Blood, POC: NEGATIVE

## 2014-02-19 MED ORDER — HYDROCORTISONE ACE-PRAMOXINE 1-1 % RE FOAM: 1.0000 | Freq: Two times a day (BID) | RECTAL | Status: DC

## 2014-02-19 NOTE — Progress Notes (Signed)
Patient ID: Amy [name], female   DOB: 1966/09/13, 48 y.o.   MRN: [id number] History of Present Illness: Amy [name] is a 48 year old white female, married in for a physical.She is complaining of hot flashes, moodiness, weight gain and ?hemorrhoids.   Current Medications, Allergies, Past Medical History, Past Surgical History, Family History and Social History were reviewed in [employer] record.     Review of Systems: Patient denies any headaches, blurred vision, shortness of breath, chest pain, abdominal pain, problems with bowel movements, urination, or intercourse. No 1 joint pain has fibromyalgia, see Positives in HPI    Physical Exam:BP 120/74  Pulse 78  Ht 5' 4"  (1.626 m)  Wt 219 lb (99.338 kg)  BMI 37.57 kg/m2  LMP 05/01/2011 General:  Well developed, well nourished, no acute distress Skin:  Warm and dry Neck:  Midline trachea, normal thyroid Lungs; Clear to auscultation bilaterally Breast:  No dominant palpable mass, retraction, or nipple discharge, sp reduction Cardiovascular: Regular rate and rhythm Abdomen:  Soft, non tender, no hepatosplenomegaly Pelvic:  External genitalia is normal in appearance.  The vagina is normal in appearance. The cervix and uterus are absent.  No adnexal masses or tenderness noted. Rectal: Good sphincter tone, no polyps, internal hemorrhoids felt.  Hemoccult negative. Extremities:  No swelling or varicosities noted Psych:  Alert and cooperative, seems happy,  Discussed menopausal symptoms and some options like estrogen therapy,or  brisdelle to help with flashes, will get labs first, then talk.Husband was with her, told them to keep communication open.Given handout on hemorrhoid banding.  Impression: Yearly gyn exam Hot flashes Weight gain Internal hemorrhoids    Plan: Rx Proctofoam HC use 2-3 x daily prn Check CBC,CMP,TSH and FSH, will talk next week Physical in 1 year Mammogram yearly Review handout on  hemorrhoids and banding and menopasue

## 2014-02-19 NOTE — Patient Instructions (Signed)
Menopause Menopause is the normal time of life when menstrual periods stop completely. Menopause is complete when you have missed 12 consecutive menstrual periods. It usually occurs between the ages of 8 years and 48 years. Very rarely does a woman develop menopause before the age of 60 years. At menopause, your ovaries stop producing the female hormones estrogen and progesterone. This can cause undesirable symptoms and also affect your health. Sometimes the symptoms may occur 4 5 years before the menopause begins. There is no relationship between menopause and:  Oral contraceptives.  Number of children you had.  Race.  The age your menstrual periods started (menarche). Heavy smokers and very thin women may develop menopause earlier in life. CAUSES  The ovaries stop producing the female hormones estrogen and progesterone.  Other causes include:  Surgery to remove both ovaries.  The ovaries stop functioning for no known reason.  Tumors of the pituitary gland in the brain.  Medical disease that affects the ovaries and hormone production.  Radiation treatment to the abdomen or pelvis.  Chemotherapy that affects the ovaries. SYMPTOMS   Hot flashes.  Night sweats.  Decrease in sex drive.  Vaginal dryness and thinning of the vagina causing painful intercourse.  Dryness of the skin and developing wrinkles.  Headaches.  Tiredness.  Irritability.  Memory problems.  Weight gain.  Bladder infections.  Hair growth of the face and chest.  Infertility. More serious symptoms include:  Loss of bone (osteoporosis) causing breaks (fractures).  Depression.  Hardening and narrowing of the arteries (atherosclerosis) causing heart attacks and strokes. DIAGNOSIS   When the menstrual periods have stopped for 12 straight months.  Physical exam.  Hormone studies of the blood. TREATMENT  There are many treatment choices and nearly as many questions about them. The  decisions to treat or not to treat menopausal changes is an individual choice made with your health care provider. Your health care provider can discuss the treatments with you. Together, you can decide which treatment will work best for you. Your treatment choices may include:   Hormone therapy (estrogen and progesterone).  Non-hormonal medicines.  Treating the individual symptoms with medicine (for example antidepressants for depression).  Herbal medicines that may help specific symptoms.  Counseling by a psychiatrist or psychologist.  Group therapy.  Lifestyle changes including:  Eating healthy.  Regular exercise.  Limiting caffeine and alcohol.  Stress management and meditation.  No treatment. HOME CARE INSTRUCTIONS   Take the medicine your health care provider gives you as directed.  Get plenty of sleep and rest.  Exercise regularly.  Eat a diet that contains calcium (good for the bones) and soy products (acts like estrogen hormone).  Avoid alcoholic beverages.  Do not smoke.  If you have hot flashes, dress in layers.  Take supplements, calcium, and vitamin D to strengthen bones.  You can use over-the-counter lubricants or moisturizers for vaginal dryness.  Group therapy is sometimes very helpful.  Acupuncture may be helpful in some cases. SEEK MEDICAL CARE IF:   You are not sure you are in menopause.  You are having menopausal symptoms and need advice and treatment.  You are still having menstrual periods after age 71 years.  You have pain with intercourse.  Menopause is complete (no menstrual period for 12 months) and you develop vaginal bleeding.  You need a referral to a specialist (gynecologist, psychiatrist, or psychologist) for treatment. SEEK IMMEDIATE MEDICAL CARE IF:   You have severe depression.  You have excessive vaginal bleeding.  You fell and think you have a broken bone.  You have pain when you urinate.  You develop leg or  chest pain.  You have a fast pounding heart beat (palpitations).  You have severe headaches.  You develop vision problems.  You feel a lump in your breast.  You have abdominal pain or severe indigestion. Document Released: 02/10/2004 Document Revised: 07/23/2013 Document Reviewed: 06/19/2013 [facility] Patient Information 2014 [place], [place]. Try WHOLE 30 physical in 1 year Mammogram yearly  Hemorrhoids Hemorrhoids are swollen veins around the rectum or anus. There are two types of hemorrhoids:   Internal hemorrhoids. These occur in the veins just inside the rectum. They may poke through to the outside and become irritated and painful.  External hemorrhoids. These occur in the veins outside the anus and can be felt as a painful swelling or hard lump near the anus. CAUSES  Pregnancy.   Obesity.   Constipation or diarrhea.   Straining to have a bowel movement.   Sitting for long periods on the toilet.  Heavy lifting or other activity that caused you to strain.  Anal intercourse. SYMPTOMS   Pain.   Anal itching or irritation.   Rectal bleeding.   Fecal leakage.   Anal swelling.   One or more lumps around the anus.  DIAGNOSIS  Your caregiver may be able to diagnose hemorrhoids by visual examination. Other examinations or tests that may be performed include:   Examination of the rectal area with a gloved hand (digital rectal exam).   Examination of anal canal using a small tube (scope).   A blood test if you have lost a significant amount of blood.  A test to look inside the colon (sigmoidoscopy or colonoscopy). TREATMENT Most hemorrhoids can be treated at home. However, if symptoms do not seem to be getting better or if you have a lot of rectal bleeding, your caregiver may perform a procedure to help make the hemorrhoids get smaller or remove them completely. Possible treatments include:   Placing a rubber band at the base of the hemorrhoid to cut  off the circulation (rubber band ligation).   Injecting a chemical to shrink the hemorrhoid (sclerotherapy).   Using a tool to burn the hemorrhoid (infrared light therapy).   Surgically removing the hemorrhoid (hemorrhoidectomy).   Stapling the hemorrhoid to block blood flow to the tissue (hemorrhoid stapling).  HOME CARE INSTRUCTIONS   Eat foods with fiber, such as whole grains, beans, nuts, fruits, and vegetables. Ask your doctor about taking products with added fiber in them (fibersupplements).  Increase fluid intake. Drink enough water and fluids to keep your urine clear or pale yellow.   Exercise regularly.   Go to the bathroom when you have the urge to have a bowel movement. Do not wait.   Avoid straining to have bowel movements.   Keep the anal area dry and clean. Use wet toilet paper or moist towelettes after a bowel movement.   Medicated creams and suppositories may be used or applied as directed.   Only take over-the-counter or prescription medicines as directed by your caregiver.   Take warm sitz baths for 15 20 minutes, 3 4 times a day to ease pain and discomfort.   Place ice packs on the hemorrhoids if they are tender and swollen. Using ice packs between sitz baths may be helpful.   Put ice in a plastic bag.   Place a towel between your skin and the bag.   Leave the ice on  for 15 20 minutes, 3 4 times a day.   Do not use a donut-shaped pillow or sit on the toilet for long periods. This increases blood pooling and pain.  SEEK MEDICAL CARE IF:  You have increasing pain and swelling that is not controlled by treatment or medicine.  You have uncontrolled bleeding.  You have difficulty or you are unable to have a bowel movement.  You have pain or inflammation outside the area of the hemorrhoids. MAKE SURE YOU:  Understand these instructions.  Will watch your condition.  Will get help right away if you are not doing well or get  worse. Document Released: 11/17/2000 Document Revised: 11/06/2012 Document Reviewed: 09/24/2012 [facility] Patient Information 2014 [place].

## 2014-02-20 ENCOUNTER — Telehealth: Payer: Self-pay | Admitting: Adult Health

## 2014-02-20 LAB — COMPREHENSIVE METABOLIC PANEL
ALK PHOS: 71 U/L (ref 39–117)
ALT: 23 U/L (ref 0–35)
AST: 21 U/L (ref 0–37)
Albumin: 4.3 g/dL (ref 3.5–5.2)
BUN: 8 mg/dL (ref 6–23)
CALCIUM: 9.7 mg/dL (ref 8.4–10.5)
CHLORIDE: 102 meq/L (ref 96–112)
CO2: 29 mEq/L (ref 19–32)
Creat: 0.69 mg/dL (ref 0.50–1.10)
Glucose, Bld: 86 mg/dL (ref 70–99)
POTASSIUM: 4 meq/L (ref 3.5–5.3)
SODIUM: 142 meq/L (ref 135–145)
TOTAL PROTEIN: 7 g/dL (ref 6.0–8.3)
Total Bilirubin: 0.5 mg/dL (ref 0.2–1.2)

## 2014-02-20 LAB — CBC
HEMATOCRIT: 44 % (ref 36.0–46.0)
Hemoglobin: 15.1 g/dL — ABNORMAL HIGH (ref 12.0–15.0)
MCH: 29 pg (ref 26.0–34.0)
MCHC: 34.3 g/dL (ref 30.0–36.0)
MCV: 84.5 fL (ref 78.0–100.0)
PLATELETS: 257 10*3/uL (ref 150–400)
RBC: 5.21 MIL/uL — ABNORMAL HIGH (ref 3.87–5.11)
RDW: 13.8 % (ref 11.5–15.5)
WBC: 8.4 10*3/uL (ref 4.0–10.5)

## 2014-02-20 LAB — TSH: TSH: 2.475 u[IU]/mL (ref 0.350–4.500)

## 2014-02-20 LAB — FOLLICLE STIMULATING HORMONE: FSH: 24.6 m[IU]/mL

## 2014-02-20 NOTE — Telephone Encounter (Signed)
Left message to call back about labs

## 2014-02-20 NOTE — Telephone Encounter (Signed)
Pt aware of labs and discussed ET,brisdelle and just watching and wanting will do that for now.

## 2014-06-08 ENCOUNTER — Emergency Department (HOSPITAL_COMMUNITY)
Admission: EM | Admit: 2014-06-08 | Discharge: 2014-06-08 | Disposition: A | Discharge status: Home / Self Care | Payer: BC Managed Care – PPO | Attending: Emergency Medicine | Admitting: Emergency Medicine

## 2014-06-08 ENCOUNTER — Emergency Department (HOSPITAL_COMMUNITY): Payer: BC Managed Care – PPO

## 2014-06-08 ENCOUNTER — Encounter (HOSPITAL_COMMUNITY): Payer: Self-pay | Admitting: Emergency Medicine

## 2014-06-08 DIAGNOSIS — Z862 Personal history of diseases of the blood and blood-forming organs and certain disorders involving the immune mechanism: Secondary | ICD-10-CM | POA: Insufficient documentation

## 2014-06-08 DIAGNOSIS — E669 Obesity, unspecified: Secondary | ICD-10-CM | POA: Insufficient documentation

## 2014-06-08 DIAGNOSIS — R1013 Epigastric pain: Secondary | ICD-10-CM

## 2014-06-08 DIAGNOSIS — Z9889 Other specified postprocedural states: Secondary | ICD-10-CM | POA: Insufficient documentation

## 2014-06-08 DIAGNOSIS — K219 Gastro-esophageal reflux disease without esophagitis: Secondary | ICD-10-CM | POA: Insufficient documentation

## 2014-06-08 DIAGNOSIS — Z79899 Other long term (current) drug therapy: Secondary | ICD-10-CM | POA: Insufficient documentation

## 2014-06-08 DIAGNOSIS — I10 Essential (primary) hypertension: Secondary | ICD-10-CM | POA: Insufficient documentation

## 2014-06-08 DIAGNOSIS — Z8669 Personal history of other diseases of the nervous system and sense organs: Secondary | ICD-10-CM | POA: Insufficient documentation

## 2014-06-08 DIAGNOSIS — Z8739 Personal history of other diseases of the musculoskeletal system and connective tissue: Secondary | ICD-10-CM | POA: Insufficient documentation

## 2014-06-08 DIAGNOSIS — F411 Generalized anxiety disorder: Secondary | ICD-10-CM | POA: Insufficient documentation

## 2014-06-08 LAB — COMPREHENSIVE METABOLIC PANEL
ALBUMIN: 3.9 g/dL (ref 3.5–5.2)
ALT: 34 U/L (ref 0–35)
AST: 33 U/L (ref 0–37)
Alkaline Phosphatase: 87 U/L (ref 39–117)
Anion gap: 14 (ref 5–15)
BUN: 7 mg/dL (ref 6–23)
CHLORIDE: 104 meq/L (ref 96–112)
CO2: 24 mEq/L (ref 19–32)
CREATININE: 0.58 mg/dL (ref 0.50–1.10)
Calcium: 9.3 mg/dL (ref 8.4–10.5)
GFR calc Af Amer: >90 mL/min (ref >90)
GFR calc non Af Amer: >90 mL/min (ref >90)
Glucose, Bld: 143 mg/dL — ABNORMAL HIGH (ref 70–99)
Potassium: 4.1 mEq/L (ref 3.7–5.3)
Sodium: 142 mEq/L (ref 137–147)
Total Bilirubin: 0.3 mg/dL (ref 0.3–1.2)
Total Protein: 7.4 g/dL (ref 6.0–8.3)

## 2014-06-08 LAB — CBC WITH DIFFERENTIAL/PLATELET
BASOS ABS: 0 10*3/uL (ref 0.0–0.1)
BASOS PCT: 0 % (ref 0–1)
EOS ABS: 0 10*3/uL (ref 0.0–0.7)
Eosinophils Relative: 1 % (ref 0–5)
HCT: 46.2 % — ABNORMAL HIGH (ref 36.0–46.0)
HEMOGLOBIN: 16.2 g/dL — AB (ref 12.0–15.0)
Lymphocytes Relative: 19 % (ref 12–46)
Lymphs Abs: 1.6 10*3/uL (ref 0.7–4.0)
MCH: 29.5 pg (ref 26.0–34.0)
MCHC: 35.1 g/dL (ref 30.0–36.0)
MCV: 84.2 fL (ref 78.0–100.0)
MONO ABS: 0.4 10*3/uL (ref 0.1–1.0)
MONOS PCT: 5 % (ref 3–12)
NEUTROS ABS: 6.4 10*3/uL (ref 1.7–7.7)
Neutrophils Relative %: 75 % (ref 43–77)
Platelets: 222 10*3/uL (ref 150–400)
RBC: 5.49 MIL/uL — ABNORMAL HIGH (ref 3.87–5.11)
RDW: 13.2 % (ref 11.5–15.5)
WBC: 8.5 10*3/uL (ref 4.0–10.5)

## 2014-06-08 LAB — TROPONIN I: Troponin I: <0.3 ng/mL (ref <0.30)

## 2014-06-08 LAB — LIPASE, BLOOD: LIPASE: 26 U/L (ref 11–59)

## 2014-06-08 MED ORDER — ONDANSETRON HCL 4 MG PO TABS: 4.0000 mg | ORAL_TABLET | Freq: Four times a day (QID) | ORAL | Status: DC

## 2014-06-08 MED ORDER — ALUM & MAG HYDROXIDE-SIMETH 200-200-20 MG/5ML PO SUSP
30.0000 mL | Freq: Once | ORAL | Status: DC
  Filled 2014-06-08: qty 30

## 2014-06-08 MED ORDER — GI COCKTAIL ~~LOC~~
30.0000 mL | Freq: Once | ORAL | Status: DC
  Filled 2014-06-08: qty 30

## 2014-06-08 MED ORDER — SUCRALFATE 1 G PO TABS: 1.0000 g | ORAL_TABLET | Freq: Three times a day (TID) | ORAL | Status: DC

## 2014-06-08 MED ORDER — SODIUM CHLORIDE 0.9 % IV BOLUS (SEPSIS)
1000.0000 mL | Freq: Once | INTRAVENOUS | Status: AC
  Administered 2014-06-08: 1000 mL via INTRAVENOUS

## 2014-06-08 MED ORDER — ONDANSETRON HCL 4 MG/2ML IJ SOLN
4.0000 mg | Freq: Once | INTRAMUSCULAR | Status: DC
  Filled 2014-06-08: qty 2

## 2014-06-08 MED ORDER — MORPHINE SULFATE 4 MG/ML IJ SOLN
4.0000 mg | Freq: Once | INTRAMUSCULAR | Status: DC
  Filled 2014-06-08: qty 1

## 2014-06-08 MED ORDER — SUCRALFATE 1 G PO TABS
1.0000 g | ORAL_TABLET | Freq: Once | ORAL | Status: AC
  Administered 2014-06-08: 1 g via ORAL
  Filled 2014-06-08: qty 1

## 2014-06-08 MED ORDER — PANTOPRAZOLE SODIUM 40 MG PO TBEC
40.0000 mg | DELAYED_RELEASE_TABLET | Freq: Once | ORAL | Status: AC
  Administered 2014-06-08: 40 mg via ORAL
  Filled 2014-06-08: qty 1

## 2014-06-08 MED ORDER — HYDROCODONE-ACETAMINOPHEN 5-325 MG PO TABS
1.0000 | ORAL_TABLET | ORAL | Status: DC | PRN
Start: 2014-06-08 — End: 2015-06-10

## 2014-06-08 MED ORDER — OMEPRAZOLE 20 MG PO CPDR: 20.0000 mg | DELAYED_RELEASE_CAPSULE | Freq: Every day | ORAL | Status: DC

## 2014-06-08 NOTE — ED Notes (Signed)
te pt uis c/o epigastric pain since last week then she started vomiting.   Epigastric pain now..  No diarrhea.  She still has her gb.  lmp none

## 2014-06-08 NOTE — ED Notes (Signed)
Dr. Ward at bedside.

## 2014-06-08 NOTE — ED Notes (Signed)
NAD noted. VS are wnl. Pt given discharge instructions and prescriptions reviewed. All questions answered. Pt ambulatory on discharge.

## 2014-06-08 NOTE — Discharge Instructions (Signed)
Possible Gastritis, Adult Gastritis is soreness and swelling (inflammation) of the lining of the stomach. Gastritis can develop as a sudden onset (acute) or long-term (chronic) condition. If gastritis is not treated, it can lead to stomach bleeding and ulcers. CAUSES  Gastritis occurs when the stomach lining is weak or damaged. Digestive juices from the stomach then inflame the weakened stomach lining. The stomach lining may be weak or damaged due to viral or bacterial infections. One common bacterial infection is the Helicobacter pylori infection. Gastritis can also result from excessive alcohol consumption, taking certain medicines, or having too much acid in the stomach.  SYMPTOMS  In some cases, there are no symptoms. When symptoms are present, they may include:  Pain or a burning sensation in the upper abdomen.  Nausea.  Vomiting.  An uncomfortable feeling of fullness after eating. DIAGNOSIS  Your caregiver may suspect you have gastritis based on your symptoms and a physical exam. To determine the cause of your gastritis, your caregiver may perform the following:  Blood or stool tests to check for the H pylori bacterium.  Gastroscopy. A thin, flexible tube (endoscope) is passed down the esophagus and into the stomach. The endoscope has a light and camera on the end. Your caregiver uses the endoscope to view the inside of the stomach.  Taking a tissue sample (biopsy) from the stomach to examine under a microscope. TREATMENT  Depending on the cause of your gastritis, medicines may be prescribed. If you have a bacterial infection, such as an H pylori infection, antibiotics may be given. If your gastritis is caused by too much acid in the stomach, H2 blockers or antacids may be given. Your caregiver may recommend that you stop taking aspirin, ibuprofen, or other nonsteroidal anti-inflammatory drugs (NSAIDs). HOME CARE INSTRUCTIONS  Only take over-the-counter or prescription medicines as  directed by your caregiver.  If you were given antibiotic medicines, take them as directed. Finish them even if you start to feel better.  Drink enough fluids to keep your urine clear or pale yellow.  Avoid foods and drinks that make your symptoms worse, such as:  Caffeine or alcoholic drinks.  Chocolate.  Peppermint or mint flavorings.  Garlic and onions.  Spicy foods.  Citrus fruits, such as oranges, lemons, or limes.  Tomato-based foods such as sauce, chili, salsa, and pizza.  Fried and fatty foods.  Eat small, frequent meals instead of large meals. SEEK IMMEDIATE MEDICAL CARE IF:   You have black or dark red stools.  You vomit blood or material that looks like coffee grounds.  You are unable to keep fluids down.  Your abdominal pain gets worse.  You have a fever.  You do not feel better after 1 week.  You have any other questions or concerns. MAKE SURE YOU:  Understand these instructions.  Will watch your condition.  Will get help right away if you are not doing well or get worse. Document Released: 11/14/2001 Document Revised: 05/21/2012 Document Reviewed: 01/03/2012 [facility] Patient Information 2015 [place], [place]. This information is not intended to replace advice given to you by your health care provider. Make sure you discuss any questions you have with your health care provider.  Possible Peptic Ulcer A peptic ulcer is a sore in the lining of in your esophagus (esophageal ulcer), stomach (gastric ulcer), or in the first part of your small intestine (duodenal ulcer). The ulcer causes erosion into the deeper tissue. CAUSES  Normally, the lining of the stomach and the small intestine protects  itself from the acid that digests food. The protective lining can be damaged by:  An infection caused by a bacterium called Helicobacter pylori (H. pylori).  Regular use of nonsteroidal anti-inflammatory drugs (NSAIDs), such as ibuprofen or aspirin.  Smoking  tobacco. Other risk factors include being older than 26, drinking alcohol excessively, and having a family history of ulcer disease.  SYMPTOMS   Burning pain or gnawing in the area between the chest and the belly button.  Heartburn.  Nausea and vomiting.  Bloating. The pain can be worse on an empty stomach and at night. If the ulcer results in bleeding, it can cause:  Black, tarry stools.  Vomiting of bright red blood.  Vomiting of coffee ground looking materials. DIAGNOSIS  A diagnosis is usually made based upon your history and an exam. Other tests and procedures may be performed to find the cause of the ulcer. Finding a cause will help determine the best treatment. Tests and procedures may include:  Blood tests, stool tests, or breath tests to check for the bacterium H. pylori.  An upper gastrointestinal (GI) series of the esophagus, stomach, and small intestine.  An endoscopy to examine the esophagus, stomach, and small intestine.  A biopsy. TREATMENT  Treatment may include:  Eliminating the cause of the ulcer, such as smoking, NSAIDs, or alcohol.  Medicines to reduce the amount of acid in your digestive tract.  Antibiotic medicines if the ulcer is caused by the H. pylori bacterium.  An upper endoscopy to treat a bleeding ulcer.  Surgery if the bleeding is severe or if the ulcer created a hole somewhere in the digestive system. HOME CARE INSTRUCTIONS   Avoid tobacco, alcohol, and caffeine. Smoking can increase the acid in the stomach, and continued smoking will impair the healing of ulcers.  Avoid foods and drinks that seem to cause discomfort or aggravate your ulcer.  Only take medicines as directed by your caregiver. Do not substitute over-the-counter medicines for prescription medicines without talking to your caregiver.  Keep any follow-up appointments and tests as directed. SEEK MEDICAL CARE IF:   Your do not improve within 7 days of starting  treatment.  You have ongoing indigestion or heartburn. SEEK IMMEDIATE MEDICAL CARE IF:   You have sudden, sharp, or persistent abdominal pain.  You have bloody or dark black, tarry stools.  You vomit blood or vomit that looks like coffee grounds.  You become light headed, weak, or feel faint.  You become sweaty or clammy. MAKE SURE YOU:   Understand these instructions.  Will watch your condition.  Will get help right away if you are not doing well or get worse. Document Released: 11/17/2000 Document Revised: 08/14/2012 Document Reviewed: 06/19/2012 [facility] Patient Information 2015 [place], [place]. This information is not intended to replace advice given to you by your health care provider. Make sure you discuss any questions you have with your health care provider.

## 2014-06-08 NOTE — ED Provider Notes (Signed)
TIME SEEN: 7:10 AM  CHIEF COMPLAINT: chest pain, abdominal pain  HPI: Patient is a 48 year old female with history of hypertension, GERD, anxiety, fibromyalgia, fatty liver who presents emergency department with complaints of a "uncomfortable feeling" underneath her breasts is been present since Tuesday, 6 days ago. She describes the pain as constant, without radiation, no aggravating or alleviating factors. She denies any fevers or cough. She states she may have had some mild shortness of breath with walking but she is not sure. She states she's had nausea and one episode of nonbloody, nonbilious vomiting this morning. No diarrhea. No bloody stool or melena. She states she had similar symptoms over one year ago and was told that her gallbladder does not empty normally.  ROS: See HPI Constitutional: no fever  Eyes: no drainage  ENT: no runny nose   Cardiovascular:  no chest pain  Resp: no SOB  GI: no vomiting GU: no dysuria Integumentary: no rash  Allergy: no hives  Musculoskeletal: no leg swelling  Neurological: no slurred speech ROS otherwise negative  PAST MEDICAL HISTORY/PAST SURGICAL HISTORY:  Past Medical History  Diagnosis Date  . PONV (postoperative nausea and vomiting)     pt states has not had ponv but has been treated in past with scopolamine, does admit to history of motion sickness, history of vertigo  . Hypertension     controlled on hctz-bp at pat 145/94  . GERD (gastroesophageal reflux disease)     prilosec otc-instructed to take dos  . Anxiety     xanax prn  . Vertigo     recent diagnosis of vertigo-uses otc meclizine  . Anemia   . Blood transfusion without reported diagnosis   . Neuromuscular disorder     firbrmylagia  . Degenerative disc disease   . Obesity   . Fibromyalgia   . Fatty liver   . Hot flashes 02/19/2014  . Weight gain 02/19/2014    MEDICATIONS:  Prior to Admission medications   Medication Sig Start Date End Date Taking? Authorizing Provider   ALPRAZolam Duanne Moron) 0.5 MG tablet Take 0.25 mg by mouth 2 (two) times daily as needed for anxiety.   Yes Historical Provider, MD  hydrochlorothiazide (MICROZIDE) 12.5 MG capsule Take 12.5 mg by mouth daily.  08/05/13  Yes Historical Provider, MD  omeprazole (PRILOSEC OTC) 20 MG tablet Take 20 mg by mouth daily.   Yes Historical Provider, MD    ALLERGIES:  Allergies  Allergen Reactions  . Hydromorphone Nausea And Vomiting  . Ciprofloxacin Rash    SOCIAL HISTORY:  History  Substance Use Topics  . Smoking status: Never Smoker   . Smokeless tobacco: Never Used  . Alcohol Use: No    FAMILY HISTORY: Family History  Problem Relation Age of Onset  . Cancer Paternal Uncle     liver   . Cancer Maternal Grandmother     throat  . Arthritis Mother   . Diabetes Father   . Hypertension Father   . Arthritis Sister   . Diabetes Maternal Grandfather   . Hypertension Maternal Grandfather   . Diabetes Paternal Grandmother   . Hypertension Paternal Grandmother   . Diabetes Paternal Grandfather   . Hypertension Paternal Grandfather     EXAM: BP 140/82  Temp(Src) 98.1 F (36.7 C) (Oral)  Resp 20  SpO2 98%  LMP 05/01/2011 CONSTITUTIONAL: Alert and oriented and responds appropriately to questions. Well-appearing; well-nourished HEAD: Normocephalic EYES: Conjunctivae clear, PERRL ENT: normal nose; no rhinorrhea; moist mucous membranes; pharynx without lesions  noted NECK: Supple, no meningismus, no LAD  CARD: RRR; S1 and S2 appreciated; no murmurs, no clicks, no rubs, no gallops RESP: Normal chest excursion without splinting or tachypnea; breath sounds clear and equal bilaterally; no wheezes, no rhonchi, no rales,  ABD/GI: Normal bowel sounds; non-distended; soft, tender to palpation diffusely across the upper abdomen with positive Murphy sign, no peritoneal signs BACK:  The back appears normal and is non-tender to palpation, there is no CVA tenderness EXT: Normal ROM in all joints;  non-tender to palpation; no edema; normal capillary refill; no cyanosis    SKIN: Normal color for age and race; warm NEURO: Moves all extremities equally PSYCH: The patient's mood and manner are appropriate. Grooming and personal hygiene are appropriate.  MEDICAL DECISION MAKING: Patient here with upper abdominal pain and chest pain is been constant for the past 6 days. DDX includes cholecystitis, cholelithiasis, pancreatitis, gastritis or peptic ulcer, ACS. Will obtain abdominal labs, abdominal ultrasound. EKG shows no ischemic changes or arrhythmia. Given her pain is reproducible, lower suspicion for ACS. We will obtain one troponin, if negative I do not feel she needs serial sets of enzymes given her pain is been constant. We'll give pain and nausea medicine and reassess. Patient has no risk factors for pulmonary embolus. Doubt dissection as patient has no back pain, is normal blood pressure, equal pulses in all of her extremities.  ED PROGRESS: Patient's labs are unremarkable. No leukocytosis. LFTs, lipase normal. Troponin negative. Chest x-ray clear. Abdominal ultrasound pending.   10:22 AM  Pt's ultrasound shows no acute abnormality. Suspect gastritis, GERD, possible peptic ulcer. We'll give GI cocktail, protonic to reassess.  11:18 AM  Pt unable to drink GI cocktail or Maalox. Given protonic and Carafate pills. She states she is feeling better. She now reports that she was told by her gastroenterologist that she would need a cholecystectomy but followed up with a surgeon who disagreed. She also reports that she had a history of H. pylori. I discussed with patient that she needs to followup with her primary care physician and may need outpatient endoscopy or repeat HIDA scan. We'll discharge with prescription for omeprazole, Carafate, Vicodin and Zofran. Have discussed strict return precautions. Discussed supportive care instructions. Patient verbalizes understanding is comfortable with plan.    EKG Interpretation  Date/Time:  Monday June 08 2014 06:52:46 EDT Ventricular Rate:  79 PR Interval:  114 QRS Duration: 103 QT Interval:  380 QTC Calculation: 436 R Axis:   32 Text Interpretation:  Sinus arrhythmia Borderline short PR interval Baseline wander in lead(s) II aVR aVF Confirmed by Wylee Ogden,  DO, Khala Tarte ([id number]) on 06/08/2014 7:06:52 AM        Doyle, DO 06/08/14 1119

## 2014-06-08 NOTE — ED Notes (Signed)
Patient transported to Ultrasound 

## 2014-07-23 ENCOUNTER — Ambulatory Visit (INDEPENDENT_AMBULATORY_CARE_PROVIDER_SITE_OTHER): Payer: BLUE CROSS/BLUE SHIELD | Admitting: General Surgery

## 2014-07-29 ENCOUNTER — Ambulatory Visit (INDEPENDENT_AMBULATORY_CARE_PROVIDER_SITE_OTHER): Payer: BLUE CROSS/BLUE SHIELD | Admitting: General Surgery

## 2014-08-17 ENCOUNTER — Ambulatory Visit: Payer: BC Managed Care – PPO | Admitting: Adult Health

## 2014-08-25 ENCOUNTER — Other Ambulatory Visit: Payer: Self-pay | Admitting: Neurosurgery

## 2014-08-25 DIAGNOSIS — M542 Cervicalgia: Secondary | ICD-10-CM

## 2014-08-25 DIAGNOSIS — M545 Low back pain, unspecified: Secondary | ICD-10-CM

## 2014-08-26 ENCOUNTER — Encounter (INDEPENDENT_AMBULATORY_CARE_PROVIDER_SITE_OTHER): Payer: Self-pay

## 2014-09-05 ENCOUNTER — Ambulatory Visit
Admission: RE | Admit: 2014-09-05 | Discharge: 2014-09-05 | Disposition: A | Discharge status: Home / Self Care | Payer: BC Managed Care – PPO | Source: Ambulatory Visit | Attending: Neurosurgery | Admitting: Neurosurgery

## 2014-09-05 DIAGNOSIS — M545 Low back pain, unspecified: Secondary | ICD-10-CM

## 2014-09-05 DIAGNOSIS — M542 Cervicalgia: Secondary | ICD-10-CM

## 2014-10-05 ENCOUNTER — Encounter (INDEPENDENT_AMBULATORY_CARE_PROVIDER_SITE_OTHER): Payer: Self-pay

## 2015-04-22 ENCOUNTER — Ambulatory Visit: Payer: Self-pay | Admitting: Adult Health

## 2015-06-09 ENCOUNTER — Telehealth: Payer: Self-pay | Admitting: Adult Health

## 2015-06-09 NOTE — Telephone Encounter (Signed)
Continue to use preparation H and come in at 3:30 tomorrow to be seen for hemorrhoids

## 2015-06-09 NOTE — Telephone Encounter (Signed)
Spoke with pt. Pt has bleeding hemorrhoids. Pt is requesting something until she can be seen next week. Please advise. Thanks!! [place]

## 2015-06-10 ENCOUNTER — Encounter: Payer: Self-pay | Admitting: Adult Health

## 2015-06-10 ENCOUNTER — Ambulatory Visit (INDEPENDENT_AMBULATORY_CARE_PROVIDER_SITE_OTHER): Payer: BLUE CROSS/BLUE SHIELD | Admitting: Adult Health

## 2015-06-10 VITALS — BP 140/80 | HR 96 | Ht 64.0 in | Wt 229.5 lb

## 2015-06-10 DIAGNOSIS — Z1212 Encounter for screening for malignant neoplasm of rectum: Secondary | ICD-10-CM | POA: Diagnosis not present

## 2015-06-10 DIAGNOSIS — K648 Other hemorrhoids: Secondary | ICD-10-CM

## 2015-06-10 LAB — HEMOCCULT GUIAC POC 1CARD (OFFICE): FECAL OCCULT BLD: NEGATIVE

## 2015-06-10 MED ORDER — HYDROCORTISONE ACE-PRAMOXINE 2.5-1 % RE CREA: 1.0000 "application " | TOPICAL_CREAM | Freq: Three times a day (TID) | RECTAL | Status: DC

## 2015-06-10 MED ORDER — HYDROCORTISONE ACE-PRAMOXINE 1-1 % RE FOAM: 1.0000 | Freq: Three times a day (TID) | RECTAL | Status: DC

## 2015-06-10 NOTE — Progress Notes (Signed)
Subjective:     Patient ID: Amy [name], female   DOB: [birth year], 49 y.o.   MRN: [id number]  HPI Amy [name] is a 49 year old white female in complaining of having bleeding hemorrhoids yesterday and some pain and itching.  Review of Systems Patient denies any headaches, hearing loss, fatigue, blurred vision, shortness of breath, chest pain, abdominal pain, problems with bowel movements, urination, or intercourse. No joint pain or mood swings. Reviewed past medical,surgical, social and family history. Reviewed medications and allergies.     Objective:   Physical Exam BP 140/80 mmHg  Pulse 96  Ht 5' 4"  (1.626 m)  Wt 229 lb 8 oz (104.101 kg)  BMI 39.37 kg/m2  LMP 05/01/2011 On rectal exam has good sphincter tone and has internal hemorrhoids, and fissure at 11-1 o'clock and has external remnant and redness, hemoccult was negative   Discussed banding and she declines at present. Assessment:     Internal hemorrhoids    Plan:     Use warm tub bath Rx proctofoam HC tid with 3 refills Use wet wipes  Increase water and fruit Review handout on hemorrhoids and banding  Follow up prn

## 2015-06-10 NOTE — Patient Instructions (Signed)

## 2015-07-11 ENCOUNTER — Encounter (HOSPITAL_COMMUNITY): Payer: Self-pay | Admitting: Emergency Medicine

## 2015-07-11 ENCOUNTER — Emergency Department (HOSPITAL_COMMUNITY)
Admission: EM | Admit: 2015-07-11 | Discharge: 2015-07-11 | Disposition: A | Discharge status: Home / Self Care | Payer: BLUE CROSS/BLUE SHIELD | Attending: Emergency Medicine | Admitting: Emergency Medicine

## 2015-07-11 ENCOUNTER — Emergency Department (HOSPITAL_COMMUNITY): Payer: BLUE CROSS/BLUE SHIELD

## 2015-07-11 DIAGNOSIS — Z8739 Personal history of other diseases of the musculoskeletal system and connective tissue: Secondary | ICD-10-CM | POA: Diagnosis not present

## 2015-07-11 DIAGNOSIS — R251 Tremor, unspecified: Secondary | ICD-10-CM | POA: Diagnosis present

## 2015-07-11 DIAGNOSIS — Z79899 Other long term (current) drug therapy: Secondary | ICD-10-CM | POA: Diagnosis not present

## 2015-07-11 DIAGNOSIS — E669 Obesity, unspecified: Secondary | ICD-10-CM | POA: Diagnosis not present

## 2015-07-11 DIAGNOSIS — K219 Gastro-esophageal reflux disease without esophagitis: Secondary | ICD-10-CM | POA: Diagnosis not present

## 2015-07-11 DIAGNOSIS — Z862 Personal history of diseases of the blood and blood-forming organs and certain disorders involving the immune mechanism: Secondary | ICD-10-CM | POA: Insufficient documentation

## 2015-07-11 DIAGNOSIS — I10 Essential (primary) hypertension: Secondary | ICD-10-CM | POA: Diagnosis not present

## 2015-07-11 DIAGNOSIS — F410 Panic disorder [episodic paroxysmal anxiety] without agoraphobia: Secondary | ICD-10-CM

## 2015-07-11 DIAGNOSIS — Z7952 Long term (current) use of systemic steroids: Secondary | ICD-10-CM | POA: Diagnosis not present

## 2015-07-11 DIAGNOSIS — F419 Anxiety disorder, unspecified: Secondary | ICD-10-CM | POA: Insufficient documentation

## 2015-07-11 DIAGNOSIS — R000 Tachycardia, unspecified: Secondary | ICD-10-CM | POA: Insufficient documentation

## 2015-07-11 LAB — CBC WITH DIFFERENTIAL/PLATELET
BASOS PCT: 0 % (ref 0–1)
Basophils Absolute: 0 10*3/uL (ref 0.0–0.1)
EOS ABS: 0.1 10*3/uL (ref 0.0–0.7)
Eosinophils Relative: 1 % (ref 0–5)
HEMATOCRIT: 47.1 % — AB (ref 36.0–46.0)
Hemoglobin: 16.1 g/dL — ABNORMAL HIGH (ref 12.0–15.0)
Lymphocytes Relative: 22 % (ref 12–46)
Lymphs Abs: 2.3 10*3/uL (ref 0.7–4.0)
MCH: 29.5 pg (ref 26.0–34.0)
MCHC: 34.2 g/dL (ref 30.0–36.0)
MCV: 86.4 fL (ref 78.0–100.0)
MONO ABS: 0.6 10*3/uL (ref 0.1–1.0)
MONOS PCT: 5 % (ref 3–12)
Neutro Abs: 7.5 10*3/uL (ref 1.7–7.7)
Neutrophils Relative %: 72 % (ref 43–77)
Platelets: 250 10*3/uL (ref 150–400)
RBC: 5.45 MIL/uL — ABNORMAL HIGH (ref 3.87–5.11)
RDW: 13.7 % (ref 11.5–15.5)
WBC: 10.5 10*3/uL (ref 4.0–10.5)

## 2015-07-11 LAB — COMPREHENSIVE METABOLIC PANEL
ALBUMIN: 4 g/dL (ref 3.5–5.0)
ALT: 48 U/L (ref 14–54)
AST: 39 U/L (ref 15–41)
Alkaline Phosphatase: 84 U/L (ref 38–126)
Anion gap: 9 (ref 5–15)
BUN: 8 mg/dL (ref 6–20)
CALCIUM: 9.4 mg/dL (ref 8.9–10.3)
CO2: 27 mmol/L (ref 22–32)
Chloride: 104 mmol/L (ref 101–111)
Creatinine, Ser: 0.63 mg/dL (ref 0.44–1.00)
GFR calc Af Amer: >60 mL/min (ref >60)
Glucose, Bld: 121 mg/dL — ABNORMAL HIGH (ref 65–99)
Potassium: 3.9 mmol/L (ref 3.5–5.1)
SODIUM: 140 mmol/L (ref 135–145)
Total Bilirubin: 0.8 mg/dL (ref 0.3–1.2)
Total Protein: 7.6 g/dL (ref 6.5–8.1)

## 2015-07-11 LAB — TROPONIN I

## 2015-07-11 LAB — D-DIMER, QUANTITATIVE (NOT AT ARMC): D DIMER QUANT: 0.3 ug{FEU}/mL (ref 0.00–0.48)

## 2015-07-11 MED ORDER — LORAZEPAM 1 MG PO TABS
1.0000 mg | ORAL_TABLET | Freq: Once | ORAL | Status: AC
  Administered 2015-07-11: 1 mg via ORAL
  Filled 2015-07-11: qty 1

## 2015-07-11 NOTE — Discharge Instructions (Signed)

## 2015-07-11 NOTE — ED Provider Notes (Signed)
CSN: [id number]     Arrival date & time 07/11/15  1442 History   First MD Initiated Contact with Patient 07/11/15 1500     Chief Complaint  Patient presents with  . Hypertension     (Consider location/radiation/quality/duration/timing/severity/associated sxs/prior Treatment) HPI Comments: Patient states she was sitting resting at home and all of a sudden she began to feel "shaky" and thought her blood sugar might be low. She is not diaphoretic. She is a family members monitor which showed a sugar of 95. She then checked her blood pressure which was 158/101. She does take hydrochlorothiazide and states she only has a normal blood pressure. She took a Xanax which she has as needed for anxiety and still found her blood pressure to be elevated at 160/120. She was worried because her monitor said her heart was irregular. She denies any chest pain. She does endorse some shortness of breath but she thinks that is due to her panicking. She denies any headache, visual changes, focal weakness, numbness or tingling. She states she takes Xanax only as needed and has never had an episode like this before. She denies visual change. Endorses history of high blood pressure, fibromyalgia, obesity, anemia and hypertension.  The history is provided by the patient.    Past Medical History  Diagnosis Date  . PONV (postoperative nausea and vomiting)     pt states has not had ponv but has been treated in past with scopolamine, does admit to history of motion sickness, history of vertigo  . Hypertension     controlled on hctz-bp at pat 145/94  . GERD (gastroesophageal reflux disease)     prilosec otc-instructed to take dos  . Anxiety     xanax prn  . Vertigo     recent diagnosis of vertigo-uses otc meclizine  . Anemia   . Blood transfusion without reported diagnosis   . Neuromuscular disorder     firbrmylagia  . Degenerative disc disease   . Obesity   . Fibromyalgia   . Fatty liver   . Hot flashes  02/19/2014  . Weight gain 02/19/2014   Past Surgical History  Procedure Laterality Date  . Ectopic pregnancy surgery      2002  . Laparoscopy      2006-for adhesions  . Breast reduction surgery      2010  . Laparoscopic assisted vaginal hysterectomy  06/14/2011    Procedure: LAPAROSCOPIC ASSISTED VAGINAL HYSTERECTOMY;  Surgeon: Luz Lex, MD;  Location: [facility];  Service: Gynecology;  Laterality: N/A;  Abdomen and vagina  . Abdominal hysterectomy     Family History  Problem Relation Age of Onset  . Cancer Paternal Uncle     liver   . Cancer Maternal Grandmother     throat  . Arthritis Mother   . Diabetes Father   . Hypertension Father   . Arthritis Sister   . Diabetes Maternal Grandfather   . Hypertension Maternal Grandfather   . Diabetes Paternal Grandmother   . Hypertension Paternal Grandmother   . Diabetes Paternal Grandfather   . Hypertension Paternal Grandfather   . Arthritis Sister    History  Substance Use Topics  . Smoking status: Never Smoker   . Smokeless tobacco: Never Used  . Alcohol Use: No   OB History    Gravida Para Term Preterm AB TAB SAB Ectopic Multiple Living   1    1   1   0     Review of Systems  Constitutional: Negative  for fever, activity change and appetite change.  HENT: Negative for congestion and rhinorrhea.   Respiratory: Positive for shortness of breath. Negative for cough and chest tightness.   Gastrointestinal: Negative for nausea, vomiting and abdominal pain.  Genitourinary: Negative for dysuria, hematuria, vaginal bleeding and vaginal discharge.  Musculoskeletal: Negative for myalgias, back pain and arthralgias.  Skin: Negative for rash.  Neurological: Negative for dizziness, weakness and headaches.  Psychiatric/Behavioral: The patient is nervous/anxious.   A complete 10 system review of systems was obtained and all systems are negative except as noted in the HPI and PMH.      Allergies  Hydromorphone and  Ciprofloxacin  Home Medications   Prior to Admission medications   Medication Sig Start Date End Date Taking? Authorizing Provider  ALPRAZolam Duanne Moron) 0.5 MG tablet Take 0.25 mg by mouth 2 (two) times daily as needed for anxiety.   Yes Historical Provider, MD  hydrochlorothiazide (MICROZIDE) 12.5 MG capsule Take 12.5 mg by mouth daily.  08/05/13  Yes Historical Provider, MD  hydrocortisone-pramoxine (PROCTOFOAM HC) rectal foam Place 1 applicator rectally 3 (three) times daily. 06/10/15  Yes Estill Dooms, NP  omeprazole (PRILOSEC OTC) 20 MG tablet Take 20 mg by mouth daily.   Yes Historical Provider, MD   BP 122/82 mmHg  Pulse 101  Temp(Src) 98.2 F (36.8 C) (Oral)  Resp 19  Ht 5' 4.5" (1.638 m)  Wt 225 lb (102.059 kg)  BMI 38.04 kg/m2  SpO2 92%  LMP 05/01/2011 Physical Exam  Constitutional: She is oriented to person, place, and time. She appears well-developed and well-nourished. No distress.  Anxious appearing  HENT:  Head: Normocephalic and atraumatic.  Mouth/Throat: Oropharynx is clear and moist. No oropharyngeal exudate.  Eyes: Conjunctivae and EOM are normal. Pupils are equal, round, and reactive to light.  Neck: Normal range of motion. Neck supple.  No meningismus.  Cardiovascular: Normal rate, normal heart sounds and intact distal pulses.   No murmur heard. Mild tachycardia  Pulmonary/Chest: Effort normal and breath sounds normal. No respiratory distress.  Abdominal: Soft. There is no tenderness. There is no rebound and no guarding.  Musculoskeletal: Normal range of motion. She exhibits no edema or tenderness.  Neurological: She is alert and oriented to person, place, and time. No cranial nerve deficit. She exhibits normal muscle tone. Coordination normal.  No ataxia on finger to nose bilaterally. No pronator drift. 5/5 strength throughout. CN 2-12 intact. Negative Romberg. Equal grip strength. Sensation intact. Gait is normal.   Skin: Skin is warm.  Psychiatric: She  has a normal mood and affect. Her behavior is normal.  Nursing note and vitals reviewed.   ED Course  Procedures (including critical care time) Labs Review Labs Reviewed  CBC WITH DIFFERENTIAL/PLATELET - Abnormal; Notable for the following:    RBC 5.45 (*)    Hemoglobin 16.1 (*)    HCT 47.1 (*)    All other components within normal limits  COMPREHENSIVE METABOLIC PANEL - Abnormal; Notable for the following:    Glucose, Bld 121 (*)    All other components within normal limits  TROPONIN I  D-DIMER, QUANTITATIVE (NOT AT [facility])    Imaging Review Dg Chest 2 View  07/11/2015   CLINICAL DATA:  Short of breath.  Hypertension.  EXAM: CHEST  2 VIEW  COMPARISON:  06/08/2014.  FINDINGS: Cardiac silhouette normal in size and configuration. Normal mediastinal and hilar contours.  Clear lungs.  No pleural effusion or pneumothorax.  Bony thorax is intact.  IMPRESSION: No  active cardiopulmonary disease.   Electronically Signed   By: Lajean Manes M.D.   On: 07/11/2015 15:46     EKG Interpretation   Date/Time:  Sunday July 11 2015 14:53:06 EDT Ventricular Rate:  88 PR Interval:  137 QRS Duration: 103 QT Interval:  364 QTC Calculation: 440 R Axis:   36 Text Interpretation:  Sinus rhythm No significant change was found  Confirmed by Wyvonnia Dusky  MD, [place] [phone]) on 07/11/2015 3:16:20 PM      MDM   Final diagnoses:  Anxiety attack   Patient with generalized shakiness, anxious feeling with elevated blood pressure. Took a Xanax at home. Denies chest pain or shortness of breath. Denies headache.  Nonfocal neurological exam. EKG is normal sinus rhythm. Chest x-rays negative. D-dimer is negative.  Patient feels better in the ED after by mouth Ativan. Doubt ACS or PE. Doubt pneumonia. Blood pressure improved to 140/90. Instructed to keep blood pressure log and follow up with PCP. BP 122/82 mmHg  Pulse 101  Temp(Src) 98.2 F (36.8 C) (Oral)  Resp 19  Ht 5' 4.5" (1.638 m)  Wt 225 lb (102.059  kg)  BMI 38.04 kg/m2  SpO2 92%  LMP 05/01/2011    Ezequiel Essex, MD 07/11/15 2307

## 2015-07-11 NOTE — ED Notes (Signed)
Patient reports "feeling shaky" today in which she checked her blood sugar which was 95 and drunk some orange juice. Per patient shaking continued so she checked her blood pressure, which was elevated (158/101). Patient reports taking xanax and laying down and checked blood pressure which was still elevated. Per patient blood pressure 168/123. Patient reports "slight headache". Patient denies any slurred speech, facial drooping, dizziness, weakness, or blurred vision. No neurological deficits noted.

## 2015-10-20 ENCOUNTER — Ambulatory Visit: Payer: BLUE CROSS/BLUE SHIELD | Admitting: Cardiovascular Disease

## 2015-10-22 ENCOUNTER — Ambulatory Visit: Payer: BLUE CROSS/BLUE SHIELD | Admitting: Cardiovascular Disease

## 2015-12-01 ENCOUNTER — Telehealth: Payer: Self-pay | Admitting: *Deleted

## 2015-12-01 NOTE — Telephone Encounter (Signed)
Pt called back and wants to cx this.

## 2016-01-13 ENCOUNTER — Encounter: Payer: Self-pay | Admitting: Adult Health

## 2016-01-23 ENCOUNTER — Emergency Department (HOSPITAL_COMMUNITY)
Admission: EM | Admit: 2016-01-23 | Discharge: 2016-01-23 | Disposition: A | Discharge status: Home / Self Care | Payer: BLUE CROSS/BLUE SHIELD | Attending: Emergency Medicine | Admitting: Emergency Medicine

## 2016-01-23 ENCOUNTER — Encounter (HOSPITAL_COMMUNITY): Payer: Self-pay | Admitting: *Deleted

## 2016-01-23 ENCOUNTER — Emergency Department (HOSPITAL_COMMUNITY): Payer: BLUE CROSS/BLUE SHIELD

## 2016-01-23 DIAGNOSIS — Z90710 Acquired absence of both cervix and uterus: Secondary | ICD-10-CM | POA: Diagnosis not present

## 2016-01-23 DIAGNOSIS — Z862 Personal history of diseases of the blood and blood-forming organs and certain disorders involving the immune mechanism: Secondary | ICD-10-CM | POA: Insufficient documentation

## 2016-01-23 DIAGNOSIS — R6889 Other general symptoms and signs: Secondary | ICD-10-CM

## 2016-01-23 DIAGNOSIS — E669 Obesity, unspecified: Secondary | ICD-10-CM | POA: Insufficient documentation

## 2016-01-23 DIAGNOSIS — K219 Gastro-esophageal reflux disease without esophagitis: Secondary | ICD-10-CM | POA: Diagnosis not present

## 2016-01-23 DIAGNOSIS — F419 Anxiety disorder, unspecified: Secondary | ICD-10-CM | POA: Diagnosis not present

## 2016-01-23 DIAGNOSIS — R110 Nausea: Secondary | ICD-10-CM | POA: Insufficient documentation

## 2016-01-23 DIAGNOSIS — R509 Fever, unspecified: Secondary | ICD-10-CM | POA: Insufficient documentation

## 2016-01-23 DIAGNOSIS — R1013 Epigastric pain: Secondary | ICD-10-CM | POA: Diagnosis not present

## 2016-01-23 DIAGNOSIS — I10 Essential (primary) hypertension: Secondary | ICD-10-CM | POA: Insufficient documentation

## 2016-01-23 DIAGNOSIS — M791 Myalgia: Secondary | ICD-10-CM | POA: Diagnosis not present

## 2016-01-23 DIAGNOSIS — Z8669 Personal history of other diseases of the nervous system and sense organs: Secondary | ICD-10-CM | POA: Insufficient documentation

## 2016-01-23 DIAGNOSIS — Z8742 Personal history of other diseases of the female genital tract: Secondary | ICD-10-CM | POA: Diagnosis not present

## 2016-01-23 LAB — CBC
HCT: 48.1 % — ABNORMAL HIGH (ref 36.0–46.0)
Hemoglobin: 16.7 g/dL — ABNORMAL HIGH (ref 12.0–15.0)
MCH: 28.9 pg (ref 26.0–34.0)
MCHC: 34.7 g/dL (ref 30.0–36.0)
MCV: 83.4 fL (ref 78.0–100.0)
Platelets: 240 10*3/uL (ref 150–400)
RBC: 5.77 MIL/uL — ABNORMAL HIGH (ref 3.87–5.11)
RDW: 13.5 % (ref 11.5–15.5)
WBC: 7.3 10*3/uL (ref 4.0–10.5)

## 2016-01-23 LAB — COMPREHENSIVE METABOLIC PANEL
ALT: 34 U/L (ref 14–54)
ANION GAP: 14 (ref 5–15)
AST: 44 U/L — AB (ref 15–41)
Albumin: 4.2 g/dL (ref 3.5–5.0)
Alkaline Phosphatase: 87 U/L (ref 38–126)
BILIRUBIN TOTAL: 1.2 mg/dL (ref 0.3–1.2)
BUN: 10 mg/dL (ref 6–20)
CO2: 25 mmol/L (ref 22–32)
Calcium: 10.1 mg/dL (ref 8.9–10.3)
Chloride: 100 mmol/L — ABNORMAL LOW (ref 101–111)
Creatinine, Ser: 0.87 mg/dL (ref 0.44–1.00)
GFR calc Af Amer: >60 mL/min (ref >60)
GLUCOSE: 107 mg/dL — AB (ref 65–99)
Potassium: 4.5 mmol/L (ref 3.5–5.1)
Sodium: 139 mmol/L (ref 135–145)
TOTAL PROTEIN: 7.6 g/dL (ref 6.5–8.1)

## 2016-01-23 LAB — URINALYSIS, ROUTINE W REFLEX MICROSCOPIC
BILIRUBIN URINE: NEGATIVE
Glucose, UA: NEGATIVE mg/dL
KETONES UR: NEGATIVE mg/dL
Leukocytes, UA: NEGATIVE
Nitrite: NEGATIVE
PROTEIN: NEGATIVE mg/dL
Specific Gravity, Urine: 1.006 (ref 1.005–1.030)
pH: 7 (ref 5.0–8.0)

## 2016-01-23 LAB — URINE MICROSCOPIC-ADD ON
Bacteria, UA: NONE SEEN
RBC / HPF: NONE SEEN RBC/hpf (ref 0–5)
WBC, UA: NONE SEEN WBC/hpf (ref 0–5)

## 2016-01-23 LAB — LIPASE, BLOOD: Lipase: 25 U/L (ref 11–51)

## 2016-01-23 MED ORDER — ACETAMINOPHEN 325 MG PO TABS
ORAL_TABLET | ORAL | Status: AC
  Filled 2016-01-23: qty 2

## 2016-01-23 MED ORDER — SODIUM CHLORIDE 0.9 % IV BOLUS (SEPSIS)
1000.0000 mL | Freq: Once | INTRAVENOUS | Status: AC
  Administered 2016-01-23: 1000 mL via INTRAVENOUS

## 2016-01-23 MED ORDER — ACETAMINOPHEN 325 MG PO TABS
650.0000 mg | ORAL_TABLET | Freq: Once | ORAL | Status: AC
  Administered 2016-01-23: 650 mg via ORAL

## 2016-01-23 MED ORDER — IOHEXOL 300 MG/ML  SOLN
100.0000 mL | Freq: Once | INTRAMUSCULAR | Status: AC | PRN
  Administered 2016-01-23: 100 mL via INTRAVENOUS

## 2016-01-23 MED ORDER — ONDANSETRON HCL 4 MG PO TABS: 4.0000 mg | ORAL_TABLET | Freq: Four times a day (QID) | ORAL | Status: DC

## 2016-01-23 NOTE — ED Notes (Addendum)
Pt reports upper abdominal pain, bloating, N/V and fever for several days. Pt reports some belching that has smelled like stool.

## 2016-01-23 NOTE — ED Provider Notes (Signed)
CSN: [id number]     Arrival date & time 01/23/16  1421 History   First MD Initiated Contact with Patient 01/23/16 2048     Chief Complaint  Patient presents with  . Abdominal Pain  . Fever     (Consider location/radiation/quality/duration/timing/severity/associated sxs/prior Treatment) HPI Comments: Patient with a history of HTN presents with complaint of generalized body aches, upper abdominal discomfort, nausea without vomiting. She also has a non-productive cough and report of fever at home. Symptoms have been progressive over several days. No diarrhea. She was concerned also that she has been belching, producing a malodorous smell "like stool". 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   Patient is a 50 y.o. female presenting with abdominal pain and fever. The history is provided by the patient. No language interpreter was used.  Abdominal Pain Pain location:  Epigastric Pain severity:  Mild Associated symptoms: fever and nausea   Associated symptoms: no chills, no dysuria, no sore throat and no vomiting   Fever Associated symptoms: myalgias and nausea   Associated symptoms: no chills, no congestion, no dysuria, no headaches, no rash, no sore throat and no vomiting     Past Medical History  Diagnosis Date  . PONV (postoperative nausea and vomiting)     pt states has not had ponv but has been treated in past with scopolamine, does admit to history of motion sickness, history of vertigo  . Hypertension     controlled on hctz-bp at pat 145/94  . GERD (gastroesophageal reflux disease)     prilosec otc-instructed to take dos  . Anxiety     xanax prn  . Vertigo     recent diagnosis of vertigo-uses otc meclizine  . Anemia   . Blood transfusion without reported diagnosis   . Neuromuscular disorder ([place])     firbrmylagia  . Degenerative disc disease   . Obesity   . Fibromyalgia   . Fatty liver   . Hot  flashes 02/19/2014  . Weight gain 02/19/2014   Past Surgical History  Procedure Laterality Date  . Ectopic pregnancy surgery      2002  . Laparoscopy      2006-for adhesions  . Breast reduction surgery      2010  . Laparoscopic assisted vaginal hysterectomy  06/14/2011    Procedure: LAPAROSCOPIC ASSISTED VAGINAL HYSTERECTOMY;  Surgeon: Luz Lex, MD;  Location: [facility];  Service: Gynecology;  Laterality: N/A;  Abdomen and vagina  . Abdominal hysterectomy     Family History  Problem Relation Age of Onset  . Cancer Paternal Uncle     liver   . Cancer Maternal Grandmother     throat  . Arthritis Mother   . Diabetes Father   . Hypertension Father   . Arthritis Sister   . Diabetes Maternal Grandfather   . Hypertension Maternal Grandfather   . Diabetes Paternal Grandmother   . Hypertension Paternal Grandmother   . Diabetes Paternal Grandfather   . Hypertension Paternal Grandfather   . Arthritis Sister    Social History  Substance Use Topics  . Smoking status: Never Smoker   . Smokeless tobacco: Never Used  . Alcohol Use: No   OB History    Gravida Para Term Preterm AB TAB SAB Ectopic Multiple Living   1    1   1   0     Review of Systems  Constitutional: Positive for  fever. Negative for chills.  HENT: Negative.  Negative for congestion and sore throat.   Respiratory: Negative.   Cardiovascular: Negative.   Gastrointestinal: Positive for nausea and abdominal pain. Negative for vomiting.       See HPI.  Genitourinary: Negative.  Negative for dysuria.  Musculoskeletal: Positive for myalgias. Negative for neck stiffness.  Skin: Negative.  Negative for rash.  Neurological: Negative.  Negative for weakness and headaches.      Allergies  Hydromorphone and Ciprofloxacin  Home Medications   Prior to Admission medications   Medication Sig Start Date End Date Taking? Authorizing Provider  ALPRAZolam Duanne Moron) 0.5 MG tablet Take 0.25 mg by mouth 2 (two) times daily as  needed for anxiety.    Historical Provider, MD  hydrochlorothiazide (MICROZIDE) 12.5 MG capsule Take 12.5 mg by mouth daily.  08/05/13   Historical Provider, MD  hydrocortisone-pramoxine (PROCTOFOAM HC) rectal foam Place 1 applicator rectally 3 (three) times daily. 06/10/15   Estill Dooms, NP  omeprazole (PRILOSEC OTC) 20 MG tablet Take 20 mg by mouth daily.    Historical Provider, MD   BP 135/87 mmHg  Pulse 112  Temp(Src) 98.4 F (36.9 C) (Oral)  Resp 22  SpO2 98%  LMP 05/01/2011 Physical Exam  Constitutional: She is oriented to person, place, and time. She appears well-developed and well-nourished.  HENT:  Head: Normocephalic.  Neck: Normal range of motion. Neck supple.  Cardiovascular: Normal rate and regular rhythm.   No murmur heard. Pulmonary/Chest: Effort normal and breath sounds normal. She has no wheezes. She has no rales.  Abdominal: Soft. Bowel sounds are normal. There is no tenderness. There is no rebound and no guarding.  No abdominal tenderness to palpation.  Musculoskeletal: Normal range of motion. She exhibits no edema.  Neurological: She is alert and oriented to person, place, and time.  Skin: Skin is warm and dry. No rash noted.  Psychiatric: She has a normal mood and affect.    ED Course  Procedures (including critical care time) Labs Review Labs Reviewed  COMPREHENSIVE METABOLIC PANEL - Abnormal; Notable for the following:    Chloride 100 (*)    Glucose, Bld 107 (*)    AST 44 (*)    All other components within normal limits  CBC - Abnormal; Notable for the following:    RBC 5.77 (*)    Hemoglobin 16.7 (*)    HCT 48.1 (*)    All other components within normal limits  URINALYSIS, ROUTINE W REFLEX MICROSCOPIC (NOT AT [facility]) - Abnormal; Notable for the following:    APPearance CLOUDY (*)    Hgb urine dipstick TRACE (*)    All other components within normal limits  URINE MICROSCOPIC-ADD ON - Abnormal; Notable for the following:    Squamous Epithelial /  LPF 0-5 (*)    All other components within normal limits  LIPASE, BLOOD   Results for orders placed or performed during the hospital encounter of 01/23/16  Lipase, blood  Result Value Ref Range   Lipase 25 11 - 51 U/L  Comprehensive metabolic panel  Result Value Ref Range   Sodium 139 135 - 145 mmol/L   Potassium 4.5 3.5 - 5.1 mmol/L   Chloride 100 (L) 101 - 111 mmol/L   CO2 25 22 - 32 mmol/L   Glucose, Bld 107 (H) 65 - 99 mg/dL   BUN 10 6 - 20 mg/dL   Creatinine, Ser 0.87 0.44 - 1.00 mg/dL   Calcium 10.1 8.9 - 10.3 mg/dL  Total Protein 7.6 6.5 - 8.1 g/dL   Albumin 4.2 3.5 - 5.0 g/dL   AST 44 (H) 15 - 41 U/L   ALT 34 14 - 54 U/L   Alkaline Phosphatase 87 38 - 126 U/L   Total Bilirubin 1.2 0.3 - 1.2 mg/dL   GFR calc non Af Amer >60 >60 mL/min   GFR calc Af Amer >60 >60 mL/min   Anion gap 14 5 - 15  CBC  Result Value Ref Range   WBC 7.3 4.0 - 10.5 K/uL   RBC 5.77 (H) 3.87 - 5.11 MIL/uL   Hemoglobin 16.7 (H) 12.0 - 15.0 g/dL   HCT 48.1 (H) 36.0 - 46.0 %   MCV 83.4 78.0 - 100.0 fL   MCH 28.9 26.0 - 34.0 pg   MCHC 34.7 30.0 - 36.0 g/dL   RDW 13.5 11.5 - 15.5 %   Platelets 240 150 - 400 K/uL  Urinalysis, Routine w reflex microscopic (not at [facility])  Result Value Ref Range   Color, Urine YELLOW YELLOW   APPearance CLOUDY (A) CLEAR   Specific Gravity, Urine 1.006 1.005 - 1.030   pH 7.0 5.0 - 8.0   Glucose, UA NEGATIVE NEGATIVE mg/dL   Hgb urine dipstick TRACE (A) NEGATIVE   Bilirubin Urine NEGATIVE NEGATIVE   Ketones, ur NEGATIVE NEGATIVE mg/dL   Protein, ur NEGATIVE NEGATIVE mg/dL   Nitrite NEGATIVE NEGATIVE   Leukocytes, UA NEGATIVE NEGATIVE  Urine microscopic-add on  Result Value Ref Range   Squamous Epithelial / LPF 0-5 (A) NONE SEEN   WBC, UA NONE SEEN 0 - 5 WBC/hpf   RBC / HPF NONE SEEN 0 - 5 RBC/hpf   Bacteria, UA NONE SEEN NONE SEEN     Imaging Review Ct Abdomen Pelvis W Contrast  01/23/2016  CLINICAL DATA:  Right lower quadrant pain for several days with  nausea, vomiting, bloating and fever. Initial encounter. EXAM: CT ABDOMEN AND PELVIS WITH CONTRAST TECHNIQUE: Multidetector CT imaging of the abdomen and pelvis was performed using the standard protocol following bolus administration of intravenous contrast. CONTRAST:  100 mL OMNIPAQUE IOHEXOL 300 MG/ML  SOLN COMPARISON:  None. FINDINGS: Mild dependent atelectasis is seen in the lung bases. No pleural or pericardial effusion. Heart size is normal. The gallbladder, liver, spleen, adrenal glands, pancreas and kidneys appear normal. The patient is status post hysterectomy. Urinary bladder is unremarkable. The stomach, small and large bowel and appendix appear normal. There is no lymphadenopathy or fluid. No focal bony abnormality is identified. IMPRESSION: Negative CT abdomen and pelvis.  Status post hysterectomy. Electronically Signed   By: Inge Rise M.D.   On: 01/23/2016 17:25   I have personally reviewed and evaluated these images and lab results as part of my medical decision-making.   EKG Interpretation None      MDM   Final diagnoses:  None    1. Flu like illness  The patient presents with body aches, cough, fever, abdominal pain, nausea (NO vomiting). Labs and imaging are essentially unremarkable. She has been exposed to flu in her work environment. Feel symptoms are c/w with flu like illness.   Initially the patient is tachycardic which improves with IVF. She is tolerating PO fluids without difficulty. She reports she is ready for discharge home.     Charlann Lange, PA-C 01/23/16 2156  Forde Dandy, MD 01/24/16 [phone]

## 2016-01-23 NOTE — Discharge Instructions (Signed)
PUSH FLUIDS AT HOME. TAKE TYLENOL AND/OR IBUPROFEN FOR ACHES AND FEVER. FOLLOW UP WITH YOUR DOCTOR IF SYMPTOMS PERSIST OR WORSEN.

## 2016-03-15 DIAGNOSIS — I10 Essential (primary) hypertension: Secondary | ICD-10-CM | POA: Diagnosis not present

## 2016-03-15 DIAGNOSIS — Z1389 Encounter for screening for other disorder: Secondary | ICD-10-CM | POA: Diagnosis not present

## 2016-03-15 DIAGNOSIS — K529 Noninfective gastroenteritis and colitis, unspecified: Secondary | ICD-10-CM | POA: Diagnosis not present

## 2016-03-15 DIAGNOSIS — E669 Obesity, unspecified: Secondary | ICD-10-CM | POA: Diagnosis not present

## 2016-03-15 DIAGNOSIS — Z6836 Body mass index (BMI) 36.0-36.9, adult: Secondary | ICD-10-CM | POA: Diagnosis not present

## 2016-07-12 ENCOUNTER — Encounter (HOSPITAL_COMMUNITY): Payer: Self-pay | Admitting: Emergency Medicine

## 2016-07-12 DIAGNOSIS — B349 Viral infection, unspecified: Secondary | ICD-10-CM | POA: Diagnosis not present

## 2016-07-12 DIAGNOSIS — R509 Fever, unspecified: Secondary | ICD-10-CM | POA: Diagnosis present

## 2016-07-12 DIAGNOSIS — R079 Chest pain, unspecified: Secondary | ICD-10-CM | POA: Diagnosis not present

## 2016-07-12 DIAGNOSIS — N61 Mastitis without abscess: Secondary | ICD-10-CM | POA: Diagnosis not present

## 2016-07-12 DIAGNOSIS — I10 Essential (primary) hypertension: Secondary | ICD-10-CM | POA: Insufficient documentation

## 2016-07-12 NOTE — ED Triage Notes (Signed)
Pt. reports gen. body aches , chills , low garde fever , chest discomfort /mild SOB onset today .

## 2016-07-13 ENCOUNTER — Emergency Department (HOSPITAL_COMMUNITY)
Admission: EM | Admit: 2016-07-13 | Discharge: 2016-07-13 | Disposition: A | Discharge status: Home / Self Care | Payer: BLUE CROSS/BLUE SHIELD | Attending: Emergency Medicine | Admitting: Emergency Medicine

## 2016-07-13 ENCOUNTER — Emergency Department (HOSPITAL_COMMUNITY): Payer: BLUE CROSS/BLUE SHIELD

## 2016-07-13 ENCOUNTER — Telehealth: Payer: Self-pay | Admitting: Adult Health

## 2016-07-13 DIAGNOSIS — R079 Chest pain, unspecified: Secondary | ICD-10-CM | POA: Diagnosis not present

## 2016-07-13 DIAGNOSIS — B349 Viral infection, unspecified: Secondary | ICD-10-CM

## 2016-07-13 DIAGNOSIS — N61 Mastitis without abscess: Secondary | ICD-10-CM

## 2016-07-13 LAB — COMPREHENSIVE METABOLIC PANEL
ALT: 45 U/L (ref 14–54)
ANION GAP: 7 (ref 5–15)
AST: 31 U/L (ref 15–41)
Albumin: 4 g/dL (ref 3.5–5.0)
Alkaline Phosphatase: 88 U/L (ref 38–126)
BUN: 6 mg/dL (ref 6–20)
CALCIUM: 9.6 mg/dL (ref 8.9–10.3)
CHLORIDE: 104 mmol/L (ref 101–111)
CO2: 27 mmol/L (ref 22–32)
Creatinine, Ser: 0.7 mg/dL (ref 0.44–1.00)
GFR calc non Af Amer: >60 mL/min (ref >60)
Glucose, Bld: 123 mg/dL — ABNORMAL HIGH (ref 65–99)
Potassium: 3.6 mmol/L (ref 3.5–5.1)
SODIUM: 138 mmol/L (ref 135–145)
Total Bilirubin: 0.8 mg/dL (ref 0.3–1.2)
Total Protein: 7.5 g/dL (ref 6.5–8.1)

## 2016-07-13 LAB — CBC WITH DIFFERENTIAL/PLATELET
Basophils Absolute: 0 10*3/uL (ref 0.0–0.1)
Basophils Relative: 0 %
EOS ABS: 0.1 10*3/uL (ref 0.0–0.7)
EOS PCT: 1 %
HCT: 47.4 % — ABNORMAL HIGH (ref 36.0–46.0)
Hemoglobin: 16.2 g/dL — ABNORMAL HIGH (ref 12.0–15.0)
LYMPHS ABS: 2.1 10*3/uL (ref 0.7–4.0)
Lymphocytes Relative: 21 %
MCH: 29.1 pg (ref 26.0–34.0)
MCHC: 34.2 g/dL (ref 30.0–36.0)
MCV: 85.1 fL (ref 78.0–100.0)
MONOS PCT: 6 %
Monocytes Absolute: 0.6 10*3/uL (ref 0.1–1.0)
Neutro Abs: 7.4 10*3/uL (ref 1.7–7.7)
Neutrophils Relative %: 72 %
PLATELETS: 246 10*3/uL (ref 150–400)
RBC: 5.57 MIL/uL — ABNORMAL HIGH (ref 3.87–5.11)
RDW: 13.2 % (ref 11.5–15.5)
WBC: 10.2 10*3/uL (ref 4.0–10.5)

## 2016-07-13 LAB — URINALYSIS, ROUTINE W REFLEX MICROSCOPIC
Bilirubin Urine: NEGATIVE
Glucose, UA: NEGATIVE mg/dL
Hgb urine dipstick: NEGATIVE
KETONES UR: NEGATIVE mg/dL
Leukocytes, UA: NEGATIVE
NITRITE: NEGATIVE
PH: 7 (ref 5.0–8.0)
Protein, ur: NEGATIVE mg/dL
SPECIFIC GRAVITY, URINE: 1.005 (ref 1.005–1.030)

## 2016-07-13 LAB — I-STAT TROPONIN, ED: Troponin i, poc: 0 ng/mL (ref 0.00–0.08)

## 2016-07-13 MED ORDER — IBUPROFEN 200 MG PO TABS
600.0000 mg | ORAL_TABLET | Freq: Once | ORAL | Status: AC
  Administered 2016-07-13: 600 mg via ORAL
  Filled 2016-07-13: qty 3

## 2016-07-13 MED ORDER — KETOROLAC TROMETHAMINE 30 MG/ML IJ SOLN: 30.0000 mg | Freq: Once | INTRAMUSCULAR | Status: DC

## 2016-07-13 MED ORDER — SODIUM CHLORIDE 0.9 % IV BOLUS (SEPSIS): 1000.0000 mL | Freq: Once | INTRAVENOUS | Status: DC

## 2016-07-13 MED ORDER — IBUPROFEN 600 MG PO TABS: 600.0000 mg | ORAL_TABLET | Freq: Four times a day (QID) | ORAL | 0 refills | Status: DC | PRN

## 2016-07-13 MED ORDER — CEPHALEXIN 500 MG PO CAPS: 500.0000 mg | ORAL_CAPSULE | Freq: Three times a day (TID) | ORAL | 0 refills | Status: DC

## 2016-07-13 NOTE — Discharge Instructions (Signed)
You were seen in the emergency room today for evaluation of fever, chills, and body aches. I suspect you have a virus. However, while you were in the emergency room you developed some redness of your skin on your chest. I will give you a prescription for antibiotics to treat for a possible skin infection. Please follow up with your primary care provider as soon as possible. Return to the ER for new or worsening symptoms.

## 2016-07-13 NOTE — Telephone Encounter (Signed)
Pt states left breast hurting with redness/heat, aches. Pt states MD at ER dx with breast cellulitis. Pt states she has been worked in on  07/18/2016 for diagnostic mammogram, [address], [place] order for diagnostic Mammography FAXED to [phone]

## 2016-07-13 NOTE — Telephone Encounter (Signed)
Pt advised per Derrek Monaco, NP needs an appt. Appt scheduled with Dr.Ferguson for tomorrow at 1 pm.

## 2016-07-13 NOTE — ED Provider Notes (Signed)
[facility] DEPT Provider Note   CSN: [id number] Arrival date & time: 07/12/16  2317  First Provider Contact:  First MD Initiated Contact with Patient 07/13/16 0606     History   Chief Complaint Chief Complaint  Patient presents with  . Generalized Body Aches  . Chills    HPI  Amy [name] is an 50 y.o. female with history of HTN, fibromyalgia, anxiety, GERD who presents to the ED for evaluation of generalized body aches and chills. She states yesterday all day at work she felt "a little achey." She states after work she got home and had sudden onset chills and she felt hot to the touch. She reports associated diffuse body aches. Over the course of the evening the aches persisted and worsened. She states she checked her temperature at home and Tmax was 101F. Fever has resolved in the ED. She states since being in the ED she has noticed some redness developing on the skin of her left breast. She states it is slightly tender to the touch; denies itching. Denies known allergies to adhesive or other exposures since being in the ED. Denies chest pain or SOB. She states since being in the ED her body aches have improved as well. She denies headache, blurred vision, weakness, numbness, abdominal pain, n/v/d. Denies URI symptoms including cough or congestion. She has not taken anything for her symptoms.  Past Medical History:  Diagnosis Date  . Anemia   . Anxiety    xanax prn  . Blood transfusion without reported diagnosis   . Degenerative disc disease   . Fatty liver   . Fibromyalgia   . GERD (gastroesophageal reflux disease)    prilosec otc-instructed to take dos  . Hot flashes 02/19/2014  . Hypertension    controlled on hctz-bp at pat 145/94  . Neuromuscular disorder ([place])    firbrmylagia  . Obesity   . PONV (postoperative nausea and vomiting)    pt states has not had ponv but has been treated in past with scopolamine, does admit to history of motion sickness, history of  vertigo  . Vertigo    recent diagnosis of vertigo-uses otc meclizine  . Weight gain 02/19/2014    Patient Active Problem List   Diagnosis Date Noted  . Hot flashes 02/19/2014  . Weight gain 02/19/2014  . Internal hemorrhoid 02/19/2014    Past Surgical History:  Procedure Laterality Date  . ABDOMINAL HYSTERECTOMY    . BREAST REDUCTION SURGERY     2010  . ECTOPIC PREGNANCY SURGERY     2002  . LAPAROSCOPIC ASSISTED VAGINAL HYSTERECTOMY  06/14/2011   Procedure: LAPAROSCOPIC ASSISTED VAGINAL HYSTERECTOMY;  Surgeon: Luz Lex, MD;  Location: [facility];  Service: Gynecology;  Laterality: N/A;  Abdomen and vagina  . LAPAROSCOPY     2006-for adhesions    OB History    Gravida Para Term Preterm AB Living   1       1 0   SAB TAB Ectopic Multiple Live Births       1           Home Medications    Prior to Admission medications   Medication Sig Start Date End Date Taking? Authorizing Provider  ALPRAZolam Duanne Moron) 0.5 MG tablet Take 0.25 mg by mouth 2 (two) times daily as needed for anxiety.    Historical Provider, MD  hydrochlorothiazide (MICROZIDE) 12.5 MG capsule Take 12.5 mg by mouth daily.  08/05/13   Historical Provider, MD  hydrocortisone-pramoxine (PROCTOFOAM  HC) rectal foam Place 1 applicator rectally 3 (three) times daily. 06/10/15   Estill Dooms, NP  omeprazole (PRILOSEC OTC) 20 MG tablet Take 20 mg by mouth daily.    Historical Provider, MD  ondansetron (ZOFRAN) 4 MG tablet Take 1 tablet (4 mg total) by mouth every 6 (six) hours. 01/23/16   Charlann Lange, PA-C    Family History Family History  Problem Relation Age of Onset  . Cancer Paternal Uncle     liver   . Cancer Maternal Grandmother     throat  . Arthritis Mother   . Diabetes Father   . Hypertension Father   . Arthritis Sister   . Diabetes Maternal Grandfather   . Hypertension Maternal Grandfather   . Diabetes Paternal Grandmother   . Hypertension Paternal Grandmother   . Diabetes Paternal Grandfather     . Hypertension Paternal Grandfather   . Arthritis Sister     Social History Social History  Substance Use Topics  . Smoking status: Never Smoker  . Smokeless tobacco: Never Used  . Alcohol use No     Allergies   Hydromorphone and Ciprofloxacin   Review of Systems Review of Systems  All other systems reviewed and are negative.    Physical Exam Updated Vital Signs BP (!) 174/101 (BP Location: Left Arm)   Pulse 110   Temp 99.1 F (37.3 C) (Oral)   Resp 20   LMP 05/01/2011   SpO2 97%   Physical Exam  Constitutional: She is oriented to person, place, and time.  HENT:  Right Ear: External ear normal.  Left Ear: External ear normal.  Nose: Nose normal.  Mouth/Throat: Oropharynx is clear and moist. No oropharyngeal exudate.  Eyes: Conjunctivae are normal.  Neck: Neck supple.  Cardiovascular: Normal rate, regular rhythm, normal heart sounds and intact distal pulses.   Pulmonary/Chest: Effort normal and breath sounds normal. No respiratory distress. She has no wheezes.  Abdominal: Soft. Bowel sounds are normal. She exhibits no distension. There is no tenderness. There is no rebound and no guarding.  Musculoskeletal: She exhibits no edema.  No c-spine, t-spine, or l-spine tenderness. No stepoff or deformity. Moves all extremities freely. Strength intact throughout. No LE edema or calf tenderness  Lymphadenopathy:    She has no cervical adenopathy.  Neurological: She is alert and oriented to person, place, and time. No cranial nerve deficit.  Skin: Skin is warm and dry.  Left breast with 5cm macular patch of erythema. Slightly warm. Minimal tenderness.  Psychiatric: She has a normal mood and affect.  Nursing note and vitals reviewed.    ED Treatments / Results  Labs (all labs ordered are listed, but only abnormal results are displayed) Labs Reviewed  CBC WITH DIFFERENTIAL/PLATELET - Abnormal; Notable for the following:       Result Value   RBC 5.57 (*)     Hemoglobin 16.2 (*)    HCT 47.4 (*)    All other components within normal limits  COMPREHENSIVE METABOLIC PANEL - Abnormal; Notable for the following:    Glucose, Bld 123 (*)    All other components within normal limits  URINALYSIS, ROUTINE W REFLEX MICROSCOPIC (NOT AT [facility]  Randolm Idol, ED    EKG  EKG Interpretation  Date/Time:  Wednesday July 12 2016 23:28:18 EDT Ventricular Rate:  114 PR Interval:  130 QRS Duration: 94 QT Interval:  338 QTC Calculation: 465 R Axis:   77 Text Interpretation:  Sinus tachycardia Cannot rule out Anterior infarct ,  age undetermined Abnormal ECG No significant change since last tracing Confirmed by [facility]  MD, Wolsey ([id number]) on 07/13/2016 5:08:29 AM       Radiology Dg Chest 2 View  Result Date: 07/13/2016 CLINICAL DATA:  Acute onset of generalized body pain and weakness. Fever and chest discomfort. Initial encounter. EXAM: CHEST  2 VIEW COMPARISON:  Chest radiograph performed 07/11/2015 FINDINGS: The lungs are well-aerated and clear. There is no evidence of focal opacification, pleural effusion or pneumothorax. The heart is normal in size; the mediastinal contour is within normal limits. No acute osseous abnormalities are seen. IMPRESSION: No acute cardiopulmonary process seen. Electronically Signed   By: Garald Balding M.D.   On: 07/13/2016 01:04    Procedures Procedures (including critical care time)  Medications Ordered in ED Medications  ibuprofen (ADVIL,MOTRIN) tablet 600 mg (not administered)     Initial Impression / Assessment and Plan / ED Course  I have reviewed the triage vital signs and the nursing notes.  Pertinent labs & imaging results that were available during my care of the patient were reviewed by me and considered in my medical decision making (see chart for details).  Clinical Course    Pt presenting with sudden onset chills, low grade fever, and generalized body aches. Her initial tachycardia has resolved.  She is afebrile. She is hemodynamically stable. I suspect she has a viral syndrome. She denies chest pain or SOB to me but apparently reported such symptoms in triage. CXR negative, troponin negative, EKG nonacute. Doubt ACS. No evidence of pneumonia, ptx.  I have low suspicion for PE and Wells score is 1.5. Pt does not want to stay in the ED for any further therapy or evaluation. I think this is reasonable. It does appear that she is developing a cellulitis of her left breast. I will cover for infection with keflex. Pt otherwise hemodynamically stable and labs unremarkable. Encouraged supportive therapies. ER return precautions given.  Vitals:   07/13/16 0530 07/13/16 0545 07/13/16 0615 07/13/16 0625  BP: 123/79 127/81 119/92 123/82  Pulse: 94 94 93 92  Resp:    18  Temp:      TempSrc:      SpO2: 93% 94% 94% 93%     Final Clinical Impressions(s) / ED Diagnoses   Final diagnoses:  Viral syndrome  Cellulitis of left breast    New Prescriptions Discharge Medication List as of 07/13/2016  6:25 AM    START taking these medications   Details  cephALEXin (KEFLEX) 500 MG capsule Take 1 capsule (500 mg total) by mouth 3 (three) times daily., Starting Thu 07/13/2016, Print    ibuprofen (ADVIL,MOTRIN) 600 MG tablet Take 1 tablet (600 mg total) by mouth every 6 (six) hours as needed., Starting Thu 07/13/2016, Print         Anne Ng, PA-C 07/13/16 2012    Ripley Fraise, MD 07/13/16 2354

## 2016-07-14 ENCOUNTER — Encounter: Payer: Self-pay | Admitting: Obstetrics and Gynecology

## 2016-07-14 ENCOUNTER — Ambulatory Visit (INDEPENDENT_AMBULATORY_CARE_PROVIDER_SITE_OTHER): Payer: BLUE CROSS/BLUE SHIELD | Admitting: Obstetrics and Gynecology

## 2016-07-14 VITALS — BP 144/90 | Ht 65.0 in | Wt 218.0 lb

## 2016-07-14 DIAGNOSIS — Z9889 Other specified postprocedural states: Secondary | ICD-10-CM

## 2016-07-14 DIAGNOSIS — N61 Mastitis without abscess: Secondary | ICD-10-CM | POA: Diagnosis not present

## 2016-07-14 MED ORDER — DICLOXACILLIN SODIUM 500 MG PO CAPS: 500.0000 mg | ORAL_CAPSULE | Freq: Four times a day (QID) | ORAL | 0 refills | Status: AC

## 2016-07-14 MED ORDER — DOXYCYCLINE HYCLATE 100 MG PO CAPS: 100.0000 mg | ORAL_CAPSULE | Freq: Two times a day (BID) | ORAL | 0 refills | Status: DC

## 2016-07-14 NOTE — Progress Notes (Signed)
Patient ID: Amy [name], female   DOB: [birth year], 50 y.o.   MRN: [id number]    [facility] Visit  @DATE @            Patient name: Amy [name] MRN [id number]  Date of birth: Dec 13, 1965  CC & HPI:   Chief Complaint  Patient presents with  . Breast Problem    left breast redness, rash and pain      Amy [name] is a 50 y.o. female presenting today for left breast pain, calor and redness onset yesterday. Pt states her symptoms began 2 days ago initially with fever (Tmax 101) before she developed mild left breast aching, similar to pain with menses. She reports that her initial tenderness continued to worsen since onset and she then developed redness and a sensation of heat. Pt notes that she was seen in the ED yesterday for these symptoms, dx with cellulitis and given rx for Keflex, which has not provided relief of her symptoms. Pt has diagnostic US and mammogram scheduled in 4 days.   ROS:  Review of Systems  Constitutional: Positive for fever.  Skin:       +pain, redness and calor to left breast    Pertinent History Reviewed:   Reviewed: Significant for abdominal hysterectomy, breast reduction surgery in 2010 Medical         Past Medical History:  Diagnosis Date  . Anemia   . Anxiety    xanax prn  . Blood transfusion without reported diagnosis   . Degenerative disc disease   . Fatty liver   . Fibromyalgia   . GERD (gastroesophageal reflux disease)    prilosec otc-instructed to take dos  . Hot flashes 02/19/2014  . Hypertension    controlled on hctz-bp at pat 145/94  . Neuromuscular disorder ([place])    firbrmylagia  . Obesity   . PONV (postoperative nausea and vomiting)    pt states has not had ponv but has been treated in past with scopolamine, does admit to history of motion sickness, history of vertigo  . Vertigo    recent diagnosis of vertigo-uses otc meclizine  . Weight gain 02/19/2014                              Surgical Hx:     Past Surgical History:  Procedure Laterality Date  . ABDOMINAL HYSTERECTOMY    . BREAST REDUCTION SURGERY     2010  . ECTOPIC PREGNANCY SURGERY     2002  . LAPAROSCOPIC ASSISTED VAGINAL HYSTERECTOMY  06/14/2011   Procedure: LAPAROSCOPIC ASSISTED VAGINAL HYSTERECTOMY;  Surgeon: Luz Lex, MD;  Location: [facility];  Service: Gynecology;  Laterality: N/A;  Abdomen and vagina  . LAPAROSCOPY     2006-for adhesions   Medications: Reviewed & Updated - see associated section                       Current Outpatient Prescriptions:  .  ALPRAZolam (XANAX) 0.5 MG tablet, Take 0.25 mg by mouth 2 (two) times daily as needed for anxiety., Disp: , Rfl:  .  cephALEXin (KEFLEX) 500 MG capsule, Take 1 capsule (500 mg total) by mouth 3 (three) times daily., Disp: 21 capsule, Rfl: 0 .  hydrochlorothiazide (MICROZIDE) 12.5 MG capsule, Take 12.5 mg by mouth daily. , Disp: , Rfl:  .  omeprazole (PRILOSEC OTC) 20 MG tablet, Take 20 mg by mouth  daily., Disp: , Rfl:    Social History: Reviewed -  reports that she has never smoked. She has never used smokeless tobacco.  Objective Findings:  Vitals: Blood pressure (!) 144/90, height 5' 5"  (1.651 m), weight 218 lb (98.9 kg), last menstrual period 05/01/2011.  Physical Examination: General appearance - alert, well appearing, and in no distress Mental status - alert, oriented to person, place, and time BREASTS: Symmetric in size. No masses, nipple drainage, or lymphadenopathy. S/p reduction of mammoplasty, well healed. Right breast entirely normal tissue. Left breast shows an old folliculitis just above the inframammary skin line. Redness extends from 4 o'clock around to 10 o'clock, local heat noted. No fluctuance or abscess suspected. Axilla negative  Musculoskeletal - no joint tenderness, deformity or swelling Extremities - peripheral pulses normal, no pedal edema, no clubbing or cyanosis Skin - normal coloration and turgor, no rashes, no suspicious skin lesions  noted    Assessment & Plan:   A:  1. Left breast: area of tenderness, erythema and calor  2. Cellulitis, possible spread from skin folliculitis   P:  1. D/c Keflex, Will rx Doxycycline and Dicloxacillin  2. Pt to take photos to monitor daily changes  3. Skin marker given to pt for continued monitoring  4. Defer diagnostic US and mammogram until tx with antibiotics 5. F/u in 4 days for recheck if unimproved, will get breast [facility] at [facility]    By signing my name below, I, Amy [name], attest that this documentation has been prepared under the direction and in the presence of Jonnie Kind, MD. Electronically Signed: Hansel [name], ED Scribe. 07/14/16. 1:13 PM.  I personally performed the services described in this documentation, which was SCRIBED in my presence. The recorded information has been reviewed and considered accurate. It has been edited as necessary during review. Jonnie Kind, MD

## 2016-07-18 DIAGNOSIS — N644 Mastodynia: Secondary | ICD-10-CM | POA: Diagnosis not present

## 2016-07-29 ENCOUNTER — Telehealth (HOSPITAL_COMMUNITY): Payer: Self-pay

## 2016-07-29 NOTE — Telephone Encounter (Signed)
Pt calling stated she had asked for ALT and AST level at time of DC and was not provided them.  Results for AST and ALT provided.

## 2016-09-14 DIAGNOSIS — I10 Essential (primary) hypertension: Secondary | ICD-10-CM | POA: Diagnosis not present

## 2016-09-14 DIAGNOSIS — Z6837 Body mass index (BMI) 37.0-37.9, adult: Secondary | ICD-10-CM | POA: Diagnosis not present

## 2016-09-14 DIAGNOSIS — Z1389 Encounter for screening for other disorder: Secondary | ICD-10-CM | POA: Diagnosis not present

## 2016-09-14 DIAGNOSIS — K219 Gastro-esophageal reflux disease without esophagitis: Secondary | ICD-10-CM | POA: Diagnosis not present

## 2016-09-15 DIAGNOSIS — N951 Menopausal and female climacteric states: Secondary | ICD-10-CM | POA: Diagnosis not present

## 2016-09-15 DIAGNOSIS — Z1389 Encounter for screening for other disorder: Secondary | ICD-10-CM | POA: Diagnosis not present

## 2016-11-21 DIAGNOSIS — R509 Fever, unspecified: Secondary | ICD-10-CM | POA: Diagnosis not present

## 2016-11-21 DIAGNOSIS — J111 Influenza due to unidentified influenza virus with other respiratory manifestations: Secondary | ICD-10-CM | POA: Diagnosis not present

## 2016-11-21 DIAGNOSIS — Z6835 Body mass index (BMI) 35.0-35.9, adult: Secondary | ICD-10-CM | POA: Diagnosis not present

## 2016-11-21 DIAGNOSIS — Z1389 Encounter for screening for other disorder: Secondary | ICD-10-CM | POA: Diagnosis not present

## 2016-11-21 DIAGNOSIS — E6609 Other obesity due to excess calories: Secondary | ICD-10-CM | POA: Diagnosis not present

## 2017-01-09 DIAGNOSIS — R062 Wheezing: Secondary | ICD-10-CM | POA: Diagnosis not present

## 2017-01-09 DIAGNOSIS — Z6837 Body mass index (BMI) 37.0-37.9, adult: Secondary | ICD-10-CM | POA: Diagnosis not present

## 2017-01-09 DIAGNOSIS — Z1389 Encounter for screening for other disorder: Secondary | ICD-10-CM | POA: Diagnosis not present

## 2017-01-09 DIAGNOSIS — J069 Acute upper respiratory infection, unspecified: Secondary | ICD-10-CM | POA: Diagnosis not present

## 2017-01-19 DIAGNOSIS — I10 Essential (primary) hypertension: Secondary | ICD-10-CM | POA: Diagnosis not present

## 2017-01-19 DIAGNOSIS — Z6838 Body mass index (BMI) 38.0-38.9, adult: Secondary | ICD-10-CM | POA: Diagnosis not present

## 2017-01-19 DIAGNOSIS — Z1389 Encounter for screening for other disorder: Secondary | ICD-10-CM | POA: Diagnosis not present

## 2017-01-19 DIAGNOSIS — Z79899 Other long term (current) drug therapy: Secondary | ICD-10-CM | POA: Diagnosis not present

## 2017-01-19 DIAGNOSIS — G894 Chronic pain syndrome: Secondary | ICD-10-CM | POA: Diagnosis not present

## 2017-01-19 DIAGNOSIS — E559 Vitamin D deficiency, unspecified: Secondary | ICD-10-CM | POA: Diagnosis not present

## 2017-01-19 DIAGNOSIS — R5383 Other fatigue: Secondary | ICD-10-CM | POA: Diagnosis not present

## 2017-01-19 DIAGNOSIS — F419 Anxiety disorder, unspecified: Secondary | ICD-10-CM | POA: Diagnosis not present

## 2017-01-29 DIAGNOSIS — E538 Deficiency of other specified B group vitamins: Secondary | ICD-10-CM | POA: Diagnosis not present

## 2017-02-01 ENCOUNTER — Emergency Department (HOSPITAL_COMMUNITY)
Admission: EM | Admit: 2017-02-01 | Discharge: 2017-02-01 | Disposition: A | Discharge status: Home / Self Care | Payer: BLUE CROSS/BLUE SHIELD | Attending: Emergency Medicine | Admitting: Emergency Medicine

## 2017-02-01 ENCOUNTER — Encounter (HOSPITAL_COMMUNITY): Payer: Self-pay | Admitting: Emergency Medicine

## 2017-02-01 DIAGNOSIS — Z79899 Other long term (current) drug therapy: Secondary | ICD-10-CM | POA: Diagnosis not present

## 2017-02-01 DIAGNOSIS — I10 Essential (primary) hypertension: Secondary | ICD-10-CM | POA: Diagnosis not present

## 2017-02-01 DIAGNOSIS — M7989 Other specified soft tissue disorders: Secondary | ICD-10-CM | POA: Diagnosis not present

## 2017-02-01 MED ORDER — LORATADINE 10 MG PO TABS
10.0000 mg | ORAL_TABLET | Freq: Once | ORAL | Status: AC
  Administered 2017-02-01: 10 mg via ORAL

## 2017-02-01 MED ORDER — LORATADINE 10 MG PO TABS
ORAL_TABLET | ORAL | Status: AC
  Administered 2017-02-01: 10 mg via ORAL
  Filled 2017-02-01: qty 1

## 2017-02-01 NOTE — ED Notes (Signed)
Patient has random large patches of pink areas on both arms.  Noticed that her left arm was swelling some tonight.  States that she had a B12 shot on Monday and started taking zantac yesterday.  Started having some tingling in her left arm since having the shot.  She also reports that on Monday she had a class and her face became really red in the class.  Today she was having some heartburn after taking zantac.  Patient is alert and oriented, no complaints of shortness of breath.  Talking in full sentences without distress.

## 2017-02-01 NOTE — Discharge Instructions (Signed)
Please apply warm compresses to your arm.  The swelling should subside in a few days.  The symptoms should improve as the swelling goes down.  Return if you have redness, fever, difficulty breathing, or rash.

## 2017-02-01 NOTE — ED Triage Notes (Signed)
Pt c/o left arm swelling she noticed tonight and states her throat feels funny. Pt states she got a b 12 injection in left arm Monday. Pt states she took zantac the first time yesterday.

## 2017-02-01 NOTE — ED Provider Notes (Signed)
[facility] DEPT Provider Note   CSN: [id number] Arrival date & time: 02/01/17  2212  By signing my name below, I, Hilbert Odor, attest that this documentation has been prepared under the direction and in the presence of Montine Circle, [place]. Electronically Signed: Hilbert Odor, Scribe. 02/01/17. 10:48 PM. History   Chief Complaint Chief Complaint  Patient presents with  . Arm Swelling    The history is provided by the patient and a relative. No language interpreter was used.  HPI Comments: Amy [name] is a 51 y.o. female who presents to the Emergency Department complaining of left arm swelling which she first noticed earlier today. The patient states that she had a B12 shot 3 days ago. Later that day the patient states that she began noticing some facial redness and that her face was warmer than normal (she states that this happens when she gets nervous). Yesterday the patient took zantac for the first time. She typically takes Prilosec. The patient wanted to try and switch up her OTC medications to see if she felt different. She reports no complications until this morning. She states that she has noticed some heartburn today.  Past Medical History:  Diagnosis Date  . Anemia   . Anxiety    xanax prn  . Blood transfusion without reported diagnosis   . Degenerative disc disease   . Fatty liver   . Fibromyalgia   . GERD (gastroesophageal reflux disease)    prilosec otc-instructed to take dos  . Hot flashes 02/19/2014  . Hypertension    controlled on hctz-bp at pat 145/94  . Neuromuscular disorder ([place])    firbrmylagia  . Obesity   . PONV (postoperative nausea and vomiting)    pt states has not had ponv but has been treated in past with scopolamine, does admit to history of motion sickness, history of vertigo  . Vertigo    recent diagnosis of vertigo-uses otc meclizine  . Weight gain 02/19/2014    Patient Active Problem List   Diagnosis Date Noted  . Hot  flashes 02/19/2014  . Weight gain 02/19/2014  . Internal hemorrhoid 02/19/2014    Past Surgical History:  Procedure Laterality Date  . ABDOMINAL HYSTERECTOMY    . BREAST REDUCTION SURGERY     2010  . ECTOPIC PREGNANCY SURGERY     2002  . LAPAROSCOPIC ASSISTED VAGINAL HYSTERECTOMY  06/14/2011   Procedure: LAPAROSCOPIC ASSISTED VAGINAL HYSTERECTOMY;  Surgeon: Luz Lex, MD;  Location: [facility];  Service: Gynecology;  Laterality: N/A;  Abdomen and vagina  . LAPAROSCOPY     2006-for adhesions    OB History    Gravida Para Term Preterm AB Living   1       1 0   SAB TAB Ectopic Multiple Live Births       1           Home Medications    Prior to Admission medications   Medication Sig Start Date End Date Taking? Authorizing Provider  ALPRAZolam Duanne Moron) 0.5 MG tablet Take 0.25 mg by mouth 2 (two) times daily as needed for anxiety.    Historical Provider, MD  doxycycline (VIBRAMYCIN) 100 MG capsule Take 1 capsule (100 mg total) by mouth 2 (two) times daily. 07/14/16   Jonnie Kind, MD  hydrochlorothiazide (MICROZIDE) 12.5 MG capsule Take 12.5 mg by mouth daily.  08/05/13   Historical Provider, MD  omeprazole (PRILOSEC OTC) 20 MG tablet Take 20 mg by mouth daily.    Historical  Provider, MD    Family History Family History  Problem Relation Age of Onset  . Cancer Paternal Uncle     liver   . Cancer Maternal Grandmother     throat  . Arthritis Mother   . Diabetes Father   . Hypertension Father   . Arthritis Sister   . Diabetes Maternal Grandfather   . Hypertension Maternal Grandfather   . Diabetes Paternal Grandmother   . Hypertension Paternal Grandmother   . Diabetes Paternal Grandfather   . Hypertension Paternal Grandfather   . Arthritis Sister     Social History Social History  Substance Use Topics  . Smoking status: Never Smoker  . Smokeless tobacco: Never Used  . Alcohol use No     Allergies   Hydromorphone and Ciprofloxacin   Review of Systems Review of  Systems  All other systems reviewed and are negative. A complete 10 system review of systems was obtained and all systems are negative except as noted in the HPI and PMH.    Physical Exam Updated Vital Signs BP 141/98   Pulse 97   Temp 98 F (36.7 C)   Resp 18   Ht 5' 5"  (1.651 m)   Wt 225 lb (102.1 kg)   LMP 05/01/2011   SpO2 98%   BMI 37.44 kg/m   Physical Exam  Constitutional: She is oriented to person, place, and time. She appears well-developed and well-nourished. No distress.  HENT:  Head: Normocephalic and atraumatic.  Oropharynx is clear No stridor  Eyes: Conjunctivae and EOM are normal. Pupils are equal, round, and reactive to light.  Neck: Normal range of motion. Neck supple.  Cardiovascular: Normal rate, regular rhythm, normal heart sounds and intact distal pulses.  Exam reveals no gallop and no friction rub.   No murmur heard. Pulmonary/Chest: Effort normal and breath sounds normal. No respiratory distress. She has no wheezes. She has no rales. She exhibits no tenderness.  CTAB  Abdominal: Soft. Bowel sounds are normal. She exhibits no distension and no mass. There is no tenderness. There is no rebound and no guarding.  Musculoskeletal: Normal range of motion. She exhibits no edema or tenderness.  Neurological: She is alert and oriented to person, place, and time.  Sensation intact  Skin: Skin is warm and dry.  No rash  Psychiatric: She has a normal mood and affect. Her behavior is normal. Judgment and thought content normal.  Nursing note and vitals reviewed.    ED Treatments / Results  DIAGNOSTIC STUDIES: Oxygen Saturation is 98% on RA, normal by my interpretation.    COORDINATION OF CARE: 10:32 PM Discussed treatment plan with pt at bedside and pt agreed to plan. I offered the patient some benadryl which she declined.  Labs (all labs ordered are listed, but only abnormal results are displayed) Labs Reviewed - No data to display  EKG  EKG  Interpretation None       Radiology No results found.  Procedures Procedures (including critical care time)  Medications Ordered in ED Medications  loratadine (CLARITIN) 10 MG tablet (not administered)  loratadine (CLARITIN) tablet 10 mg (not administered)     Initial Impression / Assessment and Plan / ED Course  I have reviewed the triage vital signs and the nursing notes.  Pertinent labs & imaging results that were available during my care of the patient were reviewed by me and considered in my medical decision making (see chart for details).     Patient with mild swelling of the  left upper arm.  Swelling seems consistent with hematoma vs local injection reaction.  No erythema or evidence of abscess or cellulitis.  She has some paresthesias in her left hand, which are likely 2/2 to the swelling, but she states that she has also had these before in the past.  I discussed that she may have a pinched nerve and may need to see a spine specialist.  She exhibits no evidence of anaphylactic reaction or an evidence of acute/local infection.  I recommend warm compresses for the hematoma/arm swelling.  Recommend switching back to omeprazole, as she had some heartburn today with the zantac.  She understands and agrees with the treatment plan.  She is stable and ready for discharge.  Final Clinical Impressions(s) / ED Diagnoses   Final diagnoses:  Swelling of arm    New Prescriptions New Prescriptions   No medications on file   I personally performed the services described in this documentation, which was scribed in my presence. The recorded information has been reviewed and is accurate.      Montine Circle, PA-C 02/01/17 [place], MD 02/01/17 [phone]

## 2017-03-19 ENCOUNTER — Ambulatory Visit (INDEPENDENT_AMBULATORY_CARE_PROVIDER_SITE_OTHER): Payer: BLUE CROSS/BLUE SHIELD | Admitting: Adult Health

## 2017-03-19 ENCOUNTER — Encounter: Payer: Self-pay | Admitting: Adult Health

## 2017-03-19 VITALS — BP 138/90 | HR 107 | Ht 65.0 in | Wt 234.0 lb

## 2017-03-19 DIAGNOSIS — K648 Other hemorrhoids: Secondary | ICD-10-CM

## 2017-03-19 DIAGNOSIS — R232 Flushing: Secondary | ICD-10-CM

## 2017-03-19 DIAGNOSIS — R635 Abnormal weight gain: Secondary | ICD-10-CM

## 2017-03-19 DIAGNOSIS — K6289 Other specified diseases of anus and rectum: Secondary | ICD-10-CM | POA: Insufficient documentation

## 2017-03-19 DIAGNOSIS — F489 Nonpsychotic mental disorder, unspecified: Secondary | ICD-10-CM

## 2017-03-19 DIAGNOSIS — R61 Generalized hyperhidrosis: Secondary | ICD-10-CM | POA: Diagnosis not present

## 2017-03-19 DIAGNOSIS — Z1211 Encounter for screening for malignant neoplasm of colon: Secondary | ICD-10-CM | POA: Diagnosis not present

## 2017-03-19 DIAGNOSIS — R52 Pain, unspecified: Secondary | ICD-10-CM | POA: Diagnosis not present

## 2017-03-19 DIAGNOSIS — N816 Rectocele: Secondary | ICD-10-CM | POA: Diagnosis not present

## 2017-03-19 DIAGNOSIS — Z1212 Encounter for screening for malignant neoplasm of rectum: Secondary | ICD-10-CM | POA: Diagnosis not present

## 2017-03-19 DIAGNOSIS — R4589 Other symptoms and signs involving emotional state: Secondary | ICD-10-CM

## 2017-03-19 LAB — HEMOCCULT GUIAC POC 1CARD (OFFICE): Fecal Occult Blood, POC: NEGATIVE

## 2017-03-19 MED ORDER — HYDROCORTISONE ACE-PRAMOXINE 2.5-1 % RE CREA: 1.0000 "application " | TOPICAL_CREAM | Freq: Three times a day (TID) | RECTAL | 2 refills | Status: DC

## 2017-03-19 NOTE — Progress Notes (Signed)
Subjective:     Patient ID: Juluis Mire, female   DOB: 1966/07/25, 51 y.o.   MRN: [id number]  HPI Haliey is a 51 years old white female in complaining of hemorrhoids and itching and irritation and preparation H not helping and also hot flashes, night sweats, moody, weight gain and body aches.She is sp hysterectomy.    Review of Systems +hemorrhoids, Preparation H not helping Rectal irritation Hot flashes Night sweats Weight gain Moody Body aches Reviewed past medical,surgical, social and family history. Reviewed medications and allergies.     Objective:   Physical Exam BP 138/90 (BP Location: Right Arm, Patient Position: Sitting, Cuff Size: Normal)   Pulse (!) 107   Ht 5' 5"  (1.651 m)   Wt 234 lb (106.1 kg)   LMP 05/01/2011   BMI 38.94 kg/m    Skin warm and dry.Pelvic: external genitalia is normal in appearance no lesions, vagina: has good color, moisture and rugae,urethra has no lesions or masses noted, cervix and uterus are absent, adnexa: no masses or tenderness noted. Bladder is non tender and no masses felt. On rectal exam has external hemorrhoid and fissure at 11 o'clock and there is area left buttock near anal opening at 3-5 o'clock that is red and raised, has good tone, and +internal hemorrhoids, hemoccult was negative, and has + rectocele. Had labs with Dr Gerarda Fraction, that were normal she said, but will check [facility] and ANA today. Discussed may be menopause. Will talk more when labs back.  Assessment:     1. Internal hemorrhoid   2. Rectal irritation   3. Rectocele   4. Screening for colorectal cancer   5. Hot flashes   6. Night sweats   7. Weight gain   8. Moody   9. Body aches       Plan:     Check FSH and ANA Meds ordered this encounter  Medications  . hydrocortisone-pramoxine (ANALPRAM-HC) 2.5-1 % rectal cream    Sig: Place 1 application rectally 3 (three) times daily.    Dispense:  30 g    Refill:  2    Order Specific Question:   Supervising  Provider    Answer:   Elonda Husky, LUTHER H [[id number]]  Recheck in 2 weeks

## 2017-03-20 ENCOUNTER — Telehealth: Payer: Self-pay | Admitting: Adult Health

## 2017-03-20 LAB — ANA: ANA: NEGATIVE

## 2017-03-20 LAB — FOLLICLE STIMULATING HORMONE: FSH: 14.9 m[IU]/mL

## 2017-03-20 NOTE — Telephone Encounter (Signed)
Left message that ANA is negative and FSH 14.9, so you are not menopausal yet

## 2017-04-04 ENCOUNTER — Ambulatory Visit (INDEPENDENT_AMBULATORY_CARE_PROVIDER_SITE_OTHER): Payer: BLUE CROSS/BLUE SHIELD | Admitting: Adult Health

## 2017-04-04 ENCOUNTER — Encounter: Payer: Self-pay | Admitting: Adult Health

## 2017-04-04 VITALS — BP 130/90 | HR 109 | Ht 65.0 in | Wt 232.0 lb

## 2017-04-04 DIAGNOSIS — K6289 Other specified diseases of anus and rectum: Secondary | ICD-10-CM | POA: Diagnosis not present

## 2017-04-04 DIAGNOSIS — K648 Other hemorrhoids: Secondary | ICD-10-CM | POA: Diagnosis not present

## 2017-04-04 NOTE — Progress Notes (Signed)
Subjective:     Patient ID: Amy [name], female   DOB: 1966-04-27, 51 y.o.   MRN: [id number]  HPI Alfred is a 51 year old white female in for follow up of hemorrhoids and rectal irritation.   Review of Systems +hemorrhoids BMs softer  Reviewed past medical,surgical, social and family history. Reviewed medications and allergies.     Objective:   Physical Exam BP 130/90 (BP Location: Left Arm, Cuff Size: Large)   Pulse (!) 109   Ht 5' 5"  (1.651 m)   Wt 232 lb (105.2 kg)   LMP 05/01/2011   BMI 38.61 kg/m Skin warm and dry.Rectal irritation better, area at 3-5 o'clock resolving and fissure at 11 o'clock resolved, still has interanl hemorrhoids on rectal exam but less tender and feel smaller.    Assessment:     1. Internal hemorrhoid   2. Rectal irritation       Plan:     Continue analpram HC twice daily Follow up in 5 weeks

## 2017-05-01 ENCOUNTER — Ambulatory Visit: Payer: BLUE CROSS/BLUE SHIELD | Admitting: Podiatry

## 2017-05-09 ENCOUNTER — Ambulatory Visit: Payer: BLUE CROSS/BLUE SHIELD | Admitting: Adult Health

## 2017-05-15 ENCOUNTER — Ambulatory Visit (HOSPITAL_COMMUNITY)
Admission: RE | Admit: 2017-05-15 | Discharge: 2017-05-15 | Disposition: A | Discharge status: Home / Self Care | Payer: BLUE CROSS/BLUE SHIELD | Source: Ambulatory Visit | Attending: Gastroenterology | Admitting: Gastroenterology

## 2017-05-15 ENCOUNTER — Other Ambulatory Visit: Payer: Self-pay | Admitting: Gastroenterology

## 2017-05-15 DIAGNOSIS — R110 Nausea: Secondary | ICD-10-CM | POA: Insufficient documentation

## 2017-05-15 DIAGNOSIS — K7689 Other specified diseases of liver: Secondary | ICD-10-CM | POA: Diagnosis not present

## 2017-05-15 DIAGNOSIS — R635 Abnormal weight gain: Secondary | ICD-10-CM | POA: Diagnosis not present

## 2017-05-15 DIAGNOSIS — R1011 Right upper quadrant pain: Secondary | ICD-10-CM | POA: Diagnosis not present

## 2017-05-15 DIAGNOSIS — K760 Fatty (change of) liver, not elsewhere classified: Secondary | ICD-10-CM | POA: Insufficient documentation

## 2017-05-15 DIAGNOSIS — K219 Gastro-esophageal reflux disease without esophagitis: Secondary | ICD-10-CM | POA: Diagnosis not present

## 2017-05-15 DIAGNOSIS — K602 Anal fissure, unspecified: Secondary | ICD-10-CM | POA: Diagnosis not present

## 2017-05-25 ENCOUNTER — Encounter (HOSPITAL_COMMUNITY)
Admission: RE | Admit: 2017-05-25 | Discharge: 2017-05-25 | Disposition: A | Discharge status: Home / Self Care | Payer: BLUE CROSS/BLUE SHIELD | Source: Ambulatory Visit | Attending: Gastroenterology | Admitting: Gastroenterology

## 2017-05-25 ENCOUNTER — Encounter (HOSPITAL_COMMUNITY): Payer: BLUE CROSS/BLUE SHIELD

## 2017-05-25 DIAGNOSIS — R110 Nausea: Secondary | ICD-10-CM | POA: Insufficient documentation

## 2017-05-25 MED ORDER — TECHNETIUM TC 99M MEBROFENIN IV KIT
5.2000 | PACK | Freq: Once | INTRAVENOUS | Status: AC | PRN
  Administered 2017-05-25: 5.2 via INTRAVENOUS

## 2017-07-02 DIAGNOSIS — Z1389 Encounter for screening for other disorder: Secondary | ICD-10-CM | POA: Diagnosis not present

## 2017-07-02 DIAGNOSIS — Z6838 Body mass index (BMI) 38.0-38.9, adult: Secondary | ICD-10-CM | POA: Diagnosis not present

## 2017-07-02 DIAGNOSIS — F419 Anxiety disorder, unspecified: Secondary | ICD-10-CM | POA: Diagnosis not present

## 2017-07-02 DIAGNOSIS — E538 Deficiency of other specified B group vitamins: Secondary | ICD-10-CM | POA: Diagnosis not present

## 2017-07-02 DIAGNOSIS — I10 Essential (primary) hypertension: Secondary | ICD-10-CM | POA: Diagnosis not present

## 2017-07-02 DIAGNOSIS — R079 Chest pain, unspecified: Secondary | ICD-10-CM | POA: Diagnosis not present

## 2017-07-04 DIAGNOSIS — R002 Palpitations: Secondary | ICD-10-CM | POA: Diagnosis not present

## 2017-07-04 DIAGNOSIS — Z1389 Encounter for screening for other disorder: Secondary | ICD-10-CM | POA: Diagnosis not present

## 2017-07-09 ENCOUNTER — Telehealth: Payer: Self-pay

## 2017-07-09 NOTE — Telephone Encounter (Signed)
SENT NOTES TO SCHEDULING 

## 2017-07-13 ENCOUNTER — Telehealth: Payer: Self-pay | Admitting: Cardiovascular Disease

## 2017-07-13 NOTE — Telephone Encounter (Signed)
Received incoming records from [facility] for upcoming appointment on 08/24/17 @ 10:00am with Dr. Sallyanne Kuster. Records given to [facility] in Medical Records. 07/13/17 ab

## 2017-07-23 DIAGNOSIS — F4001 Agoraphobia with panic disorder: Secondary | ICD-10-CM | POA: Diagnosis not present

## 2017-07-23 DIAGNOSIS — M654 Radial styloid tenosynovitis [de Quervain]: Secondary | ICD-10-CM | POA: Diagnosis not present

## 2017-07-31 DIAGNOSIS — F4001 Agoraphobia with panic disorder: Secondary | ICD-10-CM | POA: Diagnosis not present

## 2017-08-02 DIAGNOSIS — F4001 Agoraphobia with panic disorder: Secondary | ICD-10-CM | POA: Diagnosis not present

## 2017-08-14 DIAGNOSIS — F4001 Agoraphobia with panic disorder: Secondary | ICD-10-CM | POA: Diagnosis not present

## 2017-08-24 ENCOUNTER — Ambulatory Visit: Payer: BLUE CROSS/BLUE SHIELD | Admitting: Cardiovascular Disease

## 2017-08-29 DIAGNOSIS — F4001 Agoraphobia with panic disorder: Secondary | ICD-10-CM | POA: Diagnosis not present

## 2017-09-13 DIAGNOSIS — F4001 Agoraphobia with panic disorder: Secondary | ICD-10-CM | POA: Diagnosis not present

## 2017-10-15 DIAGNOSIS — F4001 Agoraphobia with panic disorder: Secondary | ICD-10-CM | POA: Diagnosis not present

## 2017-11-20 DIAGNOSIS — S91131A Puncture wound without foreign body of right great toe without damage to nail, initial encounter: Secondary | ICD-10-CM | POA: Diagnosis not present

## 2017-11-20 DIAGNOSIS — Z23 Encounter for immunization: Secondary | ICD-10-CM | POA: Diagnosis not present

## 2017-11-20 DIAGNOSIS — F4001 Agoraphobia with panic disorder: Secondary | ICD-10-CM | POA: Diagnosis not present

## 2017-11-20 DIAGNOSIS — Z1389 Encounter for screening for other disorder: Secondary | ICD-10-CM | POA: Diagnosis not present

## 2017-11-20 DIAGNOSIS — Z6841 Body Mass Index (BMI) 40.0 and over, adult: Secondary | ICD-10-CM | POA: Diagnosis not present

## 2017-12-26 DIAGNOSIS — F4001 Agoraphobia with panic disorder: Secondary | ICD-10-CM | POA: Diagnosis not present

## 2018-01-31 DIAGNOSIS — F4001 Agoraphobia with panic disorder: Secondary | ICD-10-CM | POA: Diagnosis not present

## 2018-03-06 DIAGNOSIS — F4001 Agoraphobia with panic disorder: Secondary | ICD-10-CM | POA: Diagnosis not present

## 2018-03-18 DIAGNOSIS — F329 Major depressive disorder, single episode, unspecified: Secondary | ICD-10-CM | POA: Diagnosis not present

## 2018-03-18 DIAGNOSIS — Z1389 Encounter for screening for other disorder: Secondary | ICD-10-CM | POA: Diagnosis not present

## 2018-03-18 DIAGNOSIS — R945 Abnormal results of liver function studies: Secondary | ICD-10-CM | POA: Diagnosis not present

## 2018-03-18 DIAGNOSIS — Z6841 Body Mass Index (BMI) 40.0 and over, adult: Secondary | ICD-10-CM | POA: Diagnosis not present

## 2018-03-18 DIAGNOSIS — Z79899 Other long term (current) drug therapy: Secondary | ICD-10-CM | POA: Diagnosis not present

## 2018-03-18 DIAGNOSIS — E162 Hypoglycemia, unspecified: Secondary | ICD-10-CM | POA: Diagnosis not present

## 2018-03-18 DIAGNOSIS — E748 Other specified disorders of carbohydrate metabolism: Secondary | ICD-10-CM | POA: Diagnosis not present

## 2018-03-18 DIAGNOSIS — I10 Essential (primary) hypertension: Secondary | ICD-10-CM | POA: Diagnosis not present

## 2018-04-02 DIAGNOSIS — F4001 Agoraphobia with panic disorder: Secondary | ICD-10-CM | POA: Diagnosis not present

## 2018-04-08 DIAGNOSIS — F4001 Agoraphobia with panic disorder: Secondary | ICD-10-CM | POA: Diagnosis not present

## 2018-05-10 DIAGNOSIS — F4001 Agoraphobia with panic disorder: Secondary | ICD-10-CM | POA: Diagnosis not present

## 2018-05-15 DIAGNOSIS — N6312 Unspecified lump in the right breast, upper inner quadrant: Secondary | ICD-10-CM | POA: Diagnosis not present

## 2018-05-16 ENCOUNTER — Ambulatory Visit (HOSPITAL_COMMUNITY)
Admission: RE | Admit: 2018-05-16 | Discharge: 2018-05-16 | Disposition: A | Discharge status: Home / Self Care | Payer: BLUE CROSS/BLUE SHIELD | Source: Ambulatory Visit | Attending: Internal Medicine | Admitting: Internal Medicine

## 2018-05-16 ENCOUNTER — Other Ambulatory Visit (HOSPITAL_COMMUNITY): Payer: Self-pay | Admitting: Internal Medicine

## 2018-05-16 DIAGNOSIS — R918 Other nonspecific abnormal finding of lung field: Secondary | ICD-10-CM | POA: Diagnosis not present

## 2018-05-16 DIAGNOSIS — R059 Cough, unspecified: Secondary | ICD-10-CM

## 2018-05-16 DIAGNOSIS — Z6839 Body mass index (BMI) 39.0-39.9, adult: Secondary | ICD-10-CM | POA: Diagnosis not present

## 2018-05-16 DIAGNOSIS — F1120 Opioid dependence, uncomplicated: Secondary | ICD-10-CM | POA: Diagnosis not present

## 2018-05-16 DIAGNOSIS — I10 Essential (primary) hypertension: Secondary | ICD-10-CM | POA: Diagnosis not present

## 2018-05-16 DIAGNOSIS — R05 Cough: Secondary | ICD-10-CM | POA: Diagnosis not present

## 2018-05-16 DIAGNOSIS — K219 Gastro-esophageal reflux disease without esophagitis: Secondary | ICD-10-CM | POA: Diagnosis not present

## 2018-05-16 DIAGNOSIS — J9811 Atelectasis: Secondary | ICD-10-CM | POA: Insufficient documentation

## 2018-05-16 DIAGNOSIS — Z1389 Encounter for screening for other disorder: Secondary | ICD-10-CM | POA: Diagnosis not present

## 2018-05-16 DIAGNOSIS — E538 Deficiency of other specified B group vitamins: Secondary | ICD-10-CM | POA: Diagnosis not present

## 2018-06-24 DIAGNOSIS — E1165 Type 2 diabetes mellitus with hyperglycemia: Secondary | ICD-10-CM | POA: Diagnosis not present

## 2018-06-24 DIAGNOSIS — Z6839 Body mass index (BMI) 39.0-39.9, adult: Secondary | ICD-10-CM | POA: Diagnosis not present

## 2018-06-24 DIAGNOSIS — F4001 Agoraphobia with panic disorder: Secondary | ICD-10-CM | POA: Diagnosis not present

## 2018-06-24 DIAGNOSIS — E669 Obesity, unspecified: Secondary | ICD-10-CM | POA: Diagnosis not present

## 2018-06-24 DIAGNOSIS — Z1389 Encounter for screening for other disorder: Secondary | ICD-10-CM | POA: Diagnosis not present

## 2018-06-24 DIAGNOSIS — I10 Essential (primary) hypertension: Secondary | ICD-10-CM | POA: Diagnosis not present

## 2018-06-24 DIAGNOSIS — F419 Anxiety disorder, unspecified: Secondary | ICD-10-CM | POA: Diagnosis not present

## 2018-06-24 DIAGNOSIS — Z0001 Encounter for general adult medical examination with abnormal findings: Secondary | ICD-10-CM | POA: Diagnosis not present

## 2018-06-24 DIAGNOSIS — G894 Chronic pain syndrome: Secondary | ICD-10-CM | POA: Diagnosis not present

## 2018-06-30 ENCOUNTER — Encounter: Payer: Self-pay | Admitting: Emergency Medicine

## 2018-06-30 ENCOUNTER — Emergency Department
Admission: EM | Admit: 2018-06-30 | Discharge: 2018-06-30 | Disposition: A | Discharge status: Home / Self Care | Payer: BLUE CROSS/BLUE SHIELD | Attending: Emergency Medicine | Admitting: Emergency Medicine

## 2018-06-30 DIAGNOSIS — I10 Essential (primary) hypertension: Secondary | ICD-10-CM | POA: Insufficient documentation

## 2018-06-30 DIAGNOSIS — E119 Type 2 diabetes mellitus without complications: Secondary | ICD-10-CM | POA: Diagnosis not present

## 2018-06-30 DIAGNOSIS — E11649 Type 2 diabetes mellitus with hypoglycemia without coma: Secondary | ICD-10-CM | POA: Diagnosis not present

## 2018-06-30 DIAGNOSIS — F419 Anxiety disorder, unspecified: Secondary | ICD-10-CM | POA: Diagnosis not present

## 2018-06-30 DIAGNOSIS — Z79899 Other long term (current) drug therapy: Secondary | ICD-10-CM | POA: Insufficient documentation

## 2018-06-30 HISTORY — DX: Type 2 diabetes mellitus without complications: E11.9

## 2018-06-30 LAB — CBC
HCT: 45.4 % (ref 35.0–47.0)
Hemoglobin: 15.7 g/dL (ref 12.0–16.0)
MCH: 29.5 pg (ref 26.0–34.0)
MCHC: 34.7 g/dL (ref 32.0–36.0)
MCV: 85.2 fL (ref 80.0–100.0)
Platelets: 269 10*3/uL (ref 150–440)
RBC: 5.32 MIL/uL — ABNORMAL HIGH (ref 3.80–5.20)
RDW: 14.5 % (ref 11.5–14.5)
WBC: 9.5 10*3/uL (ref 3.6–11.0)

## 2018-06-30 LAB — URINALYSIS, COMPLETE (UACMP) WITH MICROSCOPIC
Bilirubin Urine: NEGATIVE
Glucose, UA: NEGATIVE mg/dL
Ketones, ur: NEGATIVE mg/dL
Leukocytes, UA: NEGATIVE
NITRITE: NEGATIVE
Protein, ur: NEGATIVE mg/dL
Specific Gravity, Urine: 1.005 (ref 1.005–1.030)
pH: 7 (ref 5.0–8.0)

## 2018-06-30 LAB — BASIC METABOLIC PANEL
Anion gap: 11 (ref 5–15)
BUN: 10 mg/dL (ref 6–20)
CO2: 28 mmol/L (ref 22–32)
Calcium: 9.1 mg/dL (ref 8.9–10.3)
Chloride: 99 mmol/L (ref 98–111)
Creatinine, Ser: 0.78 mg/dL (ref 0.44–1.00)
GFR calc Af Amer: >60 mL/min (ref >60)
Glucose, Bld: 171 mg/dL — ABNORMAL HIGH (ref 70–99)
Potassium: 2.8 mmol/L — ABNORMAL LOW (ref 3.5–5.1)
Sodium: 138 mmol/L (ref 135–145)

## 2018-06-30 LAB — GLUCOSE, CAPILLARY
GLUCOSE-CAPILLARY: 118 mg/dL — AB (ref 70–99)
Glucose-Capillary: 176 mg/dL — ABNORMAL HIGH (ref 70–99)

## 2018-06-30 MED ORDER — POTASSIUM CHLORIDE CRYS ER 20 MEQ PO TBCR
40.0000 meq | EXTENDED_RELEASE_TABLET | Freq: Once | ORAL | Status: AC
  Administered 2018-06-30: 40 meq via ORAL
  Filled 2018-06-30: qty 2

## 2018-06-30 NOTE — ED Triage Notes (Addendum)
Hypoglycemia.  Patient's husband states patient recently diagnosed with type II diabetes.  Started new oral agent this am (? Glimpiride 1 mg po QD).  States CBG: 70 and patient feeling shakey x 30 minutes.

## 2018-06-30 NOTE — Discharge Instructions (Addendum)
Your blood sugar was stable in the ED.  Continue eating regularly.  Monitor your blood sugar multiple times a day and follow-up with your doctor.

## 2018-06-30 NOTE — ED Notes (Signed)
Reviewed discharge instructions, follow-up care with patient. Patient verbalized understanding of all information reviewed. Patient stable, with no distress noted at this time.

## 2018-06-30 NOTE — ED Provider Notes (Signed)
[facility] Emergency Department Provider Note  ____________________________________________  Time seen: Approximately 8:49 PM  I have reviewed the triage vital signs and the nursing notes.   HISTORY  Chief Complaint Hypoglycemia    HPI Amy [name] is a 52 y.o. female with a history of anxiety GERD and new onset of diabetes recently comes to the ED complaining of hypoglycemia and feeling shaky and weak at home that started at about 4:00 PM today.  She was recently diagnosed with diabetes and after a 91-monthtrial of weight loss which was unsuccessful she was started on metformin.  She is unable to tolerate this with GI distress, so she was switched to glimepiride that she started today.  She ate birthday cake for breakfast, and then took her glimepiride for the first time at about 12:30 PM today.  She then ate pimento cheese with crackers and chicken salad and was feeling fine.  Later in the afternoon when she started feeling weak and shaky she checked her blood sugar was 75.  She ate fruit and drank a soda and on arrival to the ED she feels much better.  She is worried about her blood sugar management.      Past Medical History:  Diagnosis Date  . Anemia   . Anxiety    xanax prn  . Blood transfusion without reported diagnosis   . Degenerative disc disease   . Diabetes mellitus without complication ([place]   . Fatty liver   . Fibromyalgia   . GERD (gastroesophageal reflux disease)    prilosec otc-instructed to take dos  . Hot flashes 02/19/2014  . Hypertension    controlled on hctz-bp at pat 145/94  . Neuromuscular disorder ([place]    firbrmylagia  . Obesity   . PONV (postoperative nausea and vomiting)    pt states has not had ponv but has been treated in past with scopolamine, does admit to history of motion sickness, history of vertigo  . Vertigo    recent diagnosis of vertigo-uses otc meclizine  . Weight gain 02/19/2014     Patient Active  Problem List   Diagnosis Date Noted  . Night sweats 03/19/2017  . Moody 03/19/2017  . Screening for colorectal cancer 03/19/2017  . Rectocele 03/19/2017  . Rectal irritation 03/19/2017  . Hot flashes 02/19/2014  . Weight gain 02/19/2014  . Internal hemorrhoid 02/19/2014     Past Surgical History:  Procedure Laterality Date  . ABDOMINAL HYSTERECTOMY    . BREAST REDUCTION SURGERY     2010  . ECTOPIC PREGNANCY SURGERY     2002  . LAPAROSCOPIC ASSISTED VAGINAL HYSTERECTOMY  06/14/2011   Procedure: LAPAROSCOPIC ASSISTED VAGINAL HYSTERECTOMY;  Surgeon: DLuz Lex MD;  [facility];  Service: Gynecology;  Laterality: N/A;  Abdomen and vagina  . LAPAROSCOPY     2006-for adhesions     Prior to Admission medications   Medication Sig Start Date End Date Taking? Authorizing Provider  ALPRAZolam (Duanne Moron 0.5 MG tablet Take 0.25 mg by mouth 2 (two) times daily as needed for anxiety.    [provider]  Cholecalciferol (VITAMIN D) 2000 units CAPS Take 2,000 Units by mouth daily.    [provider]  hydrochlorothiazide (MICROZIDE) 12.5 MG capsule Take 12.5 mg by mouth daily.  08/05/13   [provider]  hydrocortisone-pramoxine ([facility] 2.5-1 % rectal cream Place 1 application rectally 3 (three) times daily. 03/19/17   GEstill Dooms NP  omeprazole (PRILOSEC OTC) 20 MG tablet  Take 20 mg by mouth daily.    [provider]     Allergies Hydromorphone and Ciprofloxacin   Family History  Problem Relation Age of Onset  . Cancer Paternal Uncle        liver   . Cancer Maternal Grandmother        throat  . Arthritis Mother   . Diabetes Father   . Hypertension Father   . Arthritis Sister   . Diabetes Maternal Grandfather   . Hypertension Maternal Grandfather   . Diabetes Paternal Grandmother   . Hypertension Paternal Grandmother   . Diabetes Paternal Grandfather   . Hypertension Paternal Grandfather   . Arthritis Sister     Social  History Social History   Tobacco Use  . Smoking status: Never Smoker  . Smokeless tobacco: Never Used  Substance Use Topics  . Alcohol use: No  . Drug use: No    Review of Systems  Constitutional:   No fever or chills.  Cardiovascular:   No chest pain or syncope. Respiratory:   No dyspnea or cough. Gastrointestinal:   Negative for abdominal pain, vomiting and diarrhea.  Musculoskeletal:   Negative for focal pain or swelling All other systems reviewed and are negative except as documented above in ROS and HPI.  ____________________________________________   PHYSICAL EXAM:  VITAL SIGNS: ED Triage Vitals [06/30/18 1649]  Enc Vitals Group     BP (!) 150/96     Pulse Rate (!) 106     Resp 18     Temp 98.4 F (36.9 C)     Temp Source Oral     SpO2 97 %     Weight 237 lb (107.5 kg)     Height 5' 4"  (1.626 m)     Head Circumference      Peak Flow      Pain Score 0     Pain Loc      Pain Edu?      Excl. in [place]?     Vital signs reviewed, nursing assessments reviewed.   Constitutional:   Alert and oriented. Non-toxic appearance. Eyes:   Conjunctivae are normal. EOMI. PERRL. ENT      Head:   Normocephalic and atraumatic.      Nose:   No congestion/rhinnorhea.       Mouth/Throat:   MMM, no pharyngeal erythema. No peritonsillar mass.       Neck:   No meningismus. Full ROM. Hematological/Lymphatic/Immunilogical:   No cervical lymphadenopathy. Cardiovascular:   RRR. Symmetric bilateral radial and DP pulses.  No murmurs. Cap refill less than 2 seconds. Respiratory:   Normal respiratory effort without tachypnea/retractions. Breath sounds are clear and equal bilaterally. No wheezes/rales/rhonchi. Gastrointestinal:   Soft and nontender. Non distended. There is no CVA tenderness.  No rebound, rigidity, or guarding. Musculoskeletal:   Normal range of motion in all extremities. No joint effusions.  No lower extremity tenderness.  No edema. Neurologic:   Normal speech and  language.  Motor grossly intact. No acute focal neurologic deficits are appreciated.  Skin:    Skin is warm, dry and intact. No rash noted.  No petechiae, purpura, or bullae.  ____________________________________________    LABS (pertinent positives/negatives) (all labs ordered are listed, but only abnormal results are displayed) Labs Reviewed  GLUCOSE, CAPILLARY - Abnormal; Notable for the following components:      Result Value   Glucose-Capillary 176 (*)    All other components within normal limits  BASIC METABOLIC PANEL -  Abnormal; Notable for the following components:   Potassium 2.8 (*)    Glucose, Bld 171 (*)    All other components within normal limits  CBC - Abnormal; Notable for the following components:   RBC 5.32 (*)    All other components within normal limits  URINALYSIS, COMPLETE (UACMP) WITH MICROSCOPIC - Abnormal; Notable for the following components:   Color, Urine STRAW (*)    APPearance CLEAR (*)    Hgb urine dipstick SMALL (*)    Bacteria, UA RARE (*)    All other components within normal limits  GLUCOSE, CAPILLARY - Abnormal; Notable for the following components:   Glucose-Capillary 118 (*)    All other components within normal limits  CBG MONITORING, ED  POC URINE PREG, ED  CBG MONITORING, ED   ____________________________________________   EKG  Interpreted by me Normal sinus rhythm rate of 97, right axis, normal intervals.  Normal QRS ST segments and T waves.  ____________________________________________    RADIOLOGY  No results found.  ____________________________________________   PROCEDURES Procedures  ____________________________________________    CLINICAL IMPRESSION / ASSESSMENT AND PLAN / ED COURSE  Pertinent labs & imaging results that were available during my care of the patient were reviewed by me and considered in my medical decision making (see chart for details).    Patient presents to the ED currently asymptomatic  after an episode of normal blood sugar earlier today.  Her A1c has been uptrending, close to 8 on her last PCP visit according to the patient, so likely her body is used to being hyperglycemic, and glimepiride dropping her blood sugar down to 75 was noticeable and uncomfortable for her.  On arrival to the ED ED blood sugar is 170.  After period of observation and recheck the blood sugar was still about 120.  Vital signs are unremarkable except for a slight tachycardia which I think is due to her underlying anxiety.  Patient took her Xanax in the treatment room to manage this as she does at home.  Labs are unremarkable and she is suitable for outpatient follow-up.  Counseled her on taking her medicine daily at the same time, making sure she eats regularly, monitoring her blood sugar multiple times a day and following up with her doctor.      ____________________________________________   FINAL CLINICAL IMPRESSION(S) / ED DIAGNOSES    Final diagnoses:  Anxiety  Diabetes mellitus type 2, noninsulin dependent [facility])     ED Discharge Orders    None      Portions of this note were generated with dragon dictation software. Dictation errors may occur despite best attempts at proofreading.    Carrie Mew, MD 06/30/18 2053

## 2018-06-30 NOTE — ED Triage Notes (Signed)
Patient states she started shaking approx. 1 hour ago.  Patient checked her blood sugar and it was 75.  Patient states she has a new diagnosis of diabetes.  Patient states she took 1 dose of metformin on Monday but had a lot of diarrhea so she stopped taking that.  Patient had her medications changed to glipizide which she started taking today.  Patient appears tearful and anxious in triage.  Patient states after she checked her sugar she drank a sip of [place] and ate a plum.

## 2018-07-04 DIAGNOSIS — F4001 Agoraphobia with panic disorder: Secondary | ICD-10-CM | POA: Diagnosis not present

## 2018-07-05 DIAGNOSIS — E119 Type 2 diabetes mellitus without complications: Secondary | ICD-10-CM | POA: Diagnosis not present

## 2018-07-05 DIAGNOSIS — Z7984 Long term (current) use of oral hypoglycemic drugs: Secondary | ICD-10-CM | POA: Diagnosis not present

## 2018-08-06 DIAGNOSIS — F4001 Agoraphobia with panic disorder: Secondary | ICD-10-CM | POA: Diagnosis not present

## 2018-08-24 ENCOUNTER — Encounter: Payer: Self-pay | Admitting: *Deleted

## 2018-08-24 DIAGNOSIS — F4010 Social phobia, unspecified: Secondary | ICD-10-CM

## 2018-08-24 DIAGNOSIS — F410 Panic disorder [episodic paroxysmal anxiety] without agoraphobia: Secondary | ICD-10-CM | POA: Insufficient documentation

## 2018-08-24 DIAGNOSIS — F3181 Bipolar II disorder: Secondary | ICD-10-CM

## 2018-09-11 ENCOUNTER — Encounter: Payer: Self-pay | Admitting: Psychiatry

## 2018-09-11 ENCOUNTER — Ambulatory Visit: Payer: BLUE CROSS/BLUE SHIELD | Admitting: Mental Health

## 2018-09-11 ENCOUNTER — Ambulatory Visit: Payer: BLUE CROSS/BLUE SHIELD | Admitting: Psychiatry

## 2018-09-11 VITALS — BP 118/76 | HR 76 | Ht 65.0 in

## 2018-09-11 DIAGNOSIS — F331 Major depressive disorder, recurrent, moderate: Secondary | ICD-10-CM | POA: Diagnosis not present

## 2018-09-11 DIAGNOSIS — F4001 Agoraphobia with panic disorder: Secondary | ICD-10-CM

## 2018-09-11 DIAGNOSIS — G4700 Insomnia, unspecified: Secondary | ICD-10-CM

## 2018-09-11 DIAGNOSIS — F411 Generalized anxiety disorder: Secondary | ICD-10-CM

## 2018-09-11 DIAGNOSIS — F4010 Social phobia, unspecified: Secondary | ICD-10-CM

## 2018-09-11 DIAGNOSIS — F3181 Bipolar II disorder: Secondary | ICD-10-CM | POA: Diagnosis not present

## 2018-09-11 MED ORDER — CARIPRAZINE HCL 1.5 MG PO CAPS
1.5000 mg | ORAL_CAPSULE | Freq: Every day | ORAL | 0 refills | Status: DC
Start: 2018-09-11 — End: 2018-11-08

## 2018-09-11 MED ORDER — ASENAPINE MALEATE 5 MG SL SUBL: 5.0000 mg | SUBLINGUAL_TABLET | Freq: Every day | SUBLINGUAL | 1 refills | Status: DC

## 2018-09-11 NOTE — Patient Instructions (Signed)
Take Latuda 40 mg 1/2 tablet in the evening for one week, then stop Latuda.  Start Saphris (Asenapine) 5 mg tablet at bedtime. Tablet dissolves in your mouth and can be placed under your tongue or inside of cheek. Do not eat or drink for 10 minutes after taking it. This should help with insomnia, mood, anxiety, and thoughts. Please call office if you have any severe side effects, questions, or feel that it is helping some but not enough.  Will provide samples of Vraylar in case Saphris is not covered by insurance or is not tolerated. Contact office for instructions if needing to start [place].   Continue Prozac and Xanax as needed.

## 2018-09-11 NOTE — Progress Notes (Signed)
Amy [name] [id number] January 07, 1966 52 y.o.  Subjective:   Patient ID:  Amy [name] is a 52 y.o. (DOB August 06, 1966) female.  Chief Complaint:  Chief Complaint  Patient presents with  . Anxiety  . Depression  . Paranoid    HPI Amy [name] presents to the office today for follow-up of anxiety, mood instability, and sleep disturbance. She reports that she has a severe HA today and attributes this to sleep disturbance. She reports that she has not taken Lamictal since about 1-1.5 weeks after last visit. She reports that she did not fill Buspar or Trileptal due to concerns about possibly having dizziness as a side effect and says she had an intrusive thought or AH of "don't take that medicine. It's going to make you dizzy." She reports that she is not sure if she heard an audible voice, however recalls responding "ok, I won't take that medicine."  Reports that she is "about the same." Reports that she has had stress and anxiety. Reports that her sleep has been poor and having difficulty sleeping at night. Reports difficulty falling asleep and awakens periodically during the night. Reports recent nightmares and vivid dreams. Reports appetite is "about the same." Energy and motivation have been very low. She reports poor concentration. Reports having vague SI at times without intent. Contracts for safety.  Reports that she recently became verbally agitated with a hospital staff member when she and her mother were waiting to see her uncle. She reports that others commented that she was angry and mother told her she was "hateful." Reports frequent anger and irritability, and that sometimes this occurs in response to events she identifies as minor. Reports that mood is also persistently sad. Denies any recent manic s/s to include excessive spending, elevated energy, increased goal-directed activity, or sleeplessness. Reports having a panic attack about 1.5 weeks ago and reports that  they occur 1-2 times a month on average. She reports frequent worry.   Reports thinking that she will hear someone knocking on the door and no one is there and her dog did not bark. Reports that she may have experienced AH telling her not to take her medication. She reports feeling that others are against her. She reports that if she is outside and a vehicle goes by she will get anxious and feel that they are staring at her. Reports difficulty being in public or around crowds, and tries to get in and out as quickly as possible and becomes irritable if there are delays that prolong interaction. Feels that others are judging and criticizing her. Reports feeling that people are trying to harm her. Reports that she has kept her blinds closed for 10 years due to thinking people are trying to look in. She reports that she covers the oval window with privacy glass on her front door by placing a pillow in a chair. Denies any other delusions.   Medications: I have reviewed the patient's current medications.  Allergies:  Allergies  Allergen Reactions  . Hydromorphone Nausea And Vomiting  . Ciprofloxacin Rash    Past Medical History:  Diagnosis Date  . Anemia   . Anxiety    xanax prn  . Blood transfusion without reported diagnosis   . Degenerative disc disease   . Diabetes mellitus without complication ([place])   . Fatty liver   . Fibromyalgia   . GERD (gastroesophageal reflux disease)    prilosec otc-instructed to take dos  . Hot flashes 02/19/2014  . Hypertension  controlled on hctz-bp at pat 145/94  . Neuromuscular disorder ([place])    firbrmylagia  . Obesity   . PONV (postoperative nausea and vomiting)    pt states has not had ponv but has been treated in past with scopolamine, does admit to history of motion sickness, history of vertigo  . Vertigo    recent diagnosis of vertigo-uses otc meclizine  . Weight gain 02/19/2014    Past Surgical History:  Procedure Laterality Date  . ABDOMINAL  HYSTERECTOMY    . BREAST REDUCTION SURGERY     2010  . ECTOPIC PREGNANCY SURGERY     2002  . LAPAROSCOPIC ASSISTED VAGINAL HYSTERECTOMY  06/14/2011   Procedure: LAPAROSCOPIC ASSISTED VAGINAL HYSTERECTOMY;  Surgeon: Luz Lex, MD;  Location: [facility];  Service: Gynecology;  Laterality: N/A;  Abdomen and vagina  . LAPAROSCOPY     2006-for adhesions    Family History  Problem Relation Age of Onset  . Cancer Paternal Uncle        liver   . Cancer Maternal Grandmother        throat  . Arthritis Mother   . Anxiety disorder Mother   . Depression Mother   . Diabetes Father   . Hypertension Father   . Arthritis Sister   . Diabetes Maternal Grandfather   . Hypertension Maternal Grandfather   . Diabetes Paternal Grandmother   . Hypertension Paternal Grandmother   . Anxiety disorder Paternal Grandmother   . Diabetes Paternal Grandfather   . Hypertension Paternal Grandfather   . Mood Disorder Brother   . Arthritis Sister   . Panic disorder Sister   . Psychosis Maternal Uncle   . Mood Disorder Maternal Uncle   . Anxiety disorder Paternal Uncle     Social History   Socioeconomic History  . Marital status: Married    Spouse name: Not on file  . Number of children: Not on file  . Years of education: Not on file  . Highest education level: Not on file  Occupational History  . Not on file  Social Needs  . Financial resource strain: Not on file  . Food insecurity:    Worry: Not on file    Inability: Not on file  . Transportation needs:    Medical: Not on file    Non-medical: Not on file  Tobacco Use  . Smoking status: Never Smoker  . Smokeless tobacco: Never Used  Substance and Sexual Activity  . Alcohol use: No  . Drug use: No  . Sexual activity: Yes    Birth control/protection: Surgical    Comment: hyst  Lifestyle  . Physical activity:    Days per week: Not on file    Minutes per session: Not on file  . Stress: Not on file  Relationships  . Social connections:     Talks on phone: Not on file    Gets together: Not on file    Attends religious service: Not on file    Active member of club or organization: Not on file    Attends meetings of clubs or organizations: Not on file    Relationship status: Not on file  . Intimate partner violence:    Fear of current or ex partner: Not on file    Emotionally abused: Not on file    Physically abused: Not on file    Forced sexual activity: Not on file  Other Topics Concern  . Not on file  Social History Narrative  . Not  on file    Past Medical History, Surgical history, Social history, and Family history were reviewed and updated as appropriate.   Please see review of systems for further details on the patient's review from today.   Review of Systems:  Review of Systems  Constitutional: Positive for diaphoresis and fatigue.  Respiratory: Negative.   Gastrointestinal: Negative for constipation and diarrhea.  Neurological: Positive for headaches.  Psychiatric/Behavioral: Positive for decreased concentration, dysphoric mood, hallucinations and sleep disturbance. The patient is nervous/anxious.     Objective:   Physical Exam:  BP 118/76   Pulse 76   Ht 5' 5"  (1.651 m)   LMP 05/01/2011   BMI 39.44 kg/m   Physical Exam  Constitutional: She is oriented to person, place, and time. She appears well-developed and well-nourished.  Musculoskeletal: She exhibits no deformity.  Neurological: She is alert and oriented to person, place, and time.  Psychiatric: Her mood appears anxious. Her affect is angry. She is slowed and withdrawn. Thought content is paranoid. Thought content is not delusional. Cognition and memory are normal. She does not express impulsivity or inappropriate judgment. She exhibits a depressed mood. She expresses no homicidal and no suicidal ideation. She expresses no suicidal plans and no homicidal plans.  Speech is soft and slowed.     Lab Review:     Component Value Date/Time   NA  138 06/30/2018 1655   K 2.8 (L) 06/30/2018 1655   CL 99 06/30/2018 1655   CO2 28 06/30/2018 1655   GLUCOSE 171 (H) 06/30/2018 1655   BUN 10 06/30/2018 1655   CREATININE 0.78 06/30/2018 1655   CREATININE 0.69 02/19/2014 1542   CALCIUM 9.1 06/30/2018 1655   PROT 7.5 07/12/2016 2349   ALBUMIN 4.0 07/12/2016 2349   AST 31 07/12/2016 2349   ALT 45 07/12/2016 2349   ALKPHOS 88 07/12/2016 2349   BILITOT 0.8 07/12/2016 2349   GFRNONAA >60 06/30/2018 1655   GFRAA >60 06/30/2018 1655       Component Value Date/Time   WBC 9.5 06/30/2018 1655   RBC 5.32 (H) 06/30/2018 1655   HGB 15.7 06/30/2018 1655   HCT 45.4 06/30/2018 1655   PLT 269 06/30/2018 1655   MCV 85.2 06/30/2018 1655   MCH 29.5 06/30/2018 1655   MCHC 34.7 06/30/2018 1655   RDW 14.5 06/30/2018 1655   LYMPHSABS 2.1 07/12/2016 2349   MONOABS 0.6 07/12/2016 2349   EOSABS 0.1 07/12/2016 2349   BASOSABS 0.0 07/12/2016 2349     Assessment: Plan:   Patient seen for 45 minutes and greater than 30% of visit spent counseling patient regarding social anxiety and paranoia.  Also discussed various treatment options at length.  Discussed not restarting Lamictal since patient denies any change in mood since stopping Lamictal almost 1 month ago.  Discussed that Latuda no longer seems to be effective for her mood or psychotic signs and symptoms, and therefore recommend changing Latuda to another antipsychotic.  Discussed potential benefits, risks, and side effects of various antipsychotics since patient has diabetes and would like to minimize risk of metabolic side effects.  Patient also prefers to avoid medications with high likelihood of causing weight gain.  Discussed starting Saphris since it typically has a low risk for metabolic side effects compared to olanzapine and quetiapine, and may be effective for her insomnia and anxiety in addition to mood and psychotic signs and symptoms.  Provided patient education regarding orally disintegrating  tablet.  Patient reports some concerns about Saphris possibly not  being covered by insurance.  Discussed alternatives in case Saphris is not covered by insurance or is not tolerated and provided patient with samples of Vraylar since patient lives approximately an hour away from office.  Patient advised to contact office prior to starting Vraylar.  Patient to decrease Latuda to half of a 40 mg tablet at bedtime for 1 week, and then discontinue. Patient to start Saphris 5 mg SL nightly for psychosis, mood, and insomnia. Patient to continue Prozac 20 mg daily and Xanax as needed.  Xanax is managed by her PCP.  Recommend continuing psychotherapy with Amy [name], LPC.  Bipolar II disorder ([place]) - Plan: asenapine (SAPHRIS) 5 MG SUBL 24 hr tablet, cariprazine (VRAYLAR) capsule  Social anxiety disorder  Please see After Visit Summary for patient specific instructions.  Future Appointments  Date Time Provider [place]  10/18/2018 10:30 AM Thayer Headings, PMHNP CP-CP None    No orders of the defined types were placed in this encounter.     -------------------------------

## 2018-09-11 NOTE — Progress Notes (Signed)
      [facility] Counselor/Therapist Progress Note   Patient ID: Orit Sanville, MRN: [id number]  Date: 09/11/2018  Timespent: 45 minutes  Treatment Type: Individual  Subjective:  Continued anxiety, anic and depression. Not comforatble leaving the house. Occasionally goes out with Mother. Extended family is dealing with mentally ill, psychotic uncle who is homeless. Still enjoys pt dog Max.  Interventions:CBT and Supportive  Mental Status Exam:   Appearance:   Casual     Behavior:  Appropriate  Motor:  Normal  Speech/Language:   Clear and Coherent  Affect:  Depressed  Mood:  anxious  Thought process:  Coherent  Thought content:    WDL  Perceptual disturbances:    Normal  Orientation:  Full (Time, Place, and Person)  Attention:  Good  Concentration:  difficulty with focus and attention  Memory:  Immediate  Fund of knowledge:   Good  Insight:    Good  Judgment:   Good  Impulse Control:  good    Reported Symptoms: depression, anxiety, difficulty being out with people or crowds. Wakes up panicky. Sleep often poor.  Risk Assessment: Danger to Self:  No Self-injurious Behavior: No Danger to Others: No Duty to Warn:no Physical Aggression / Violence:No  Access to Firearms a concern: No  Gang Involvement:No   Diagnosis:   ICD-10-CM   1. Social anxiety disorder F40.10   2. Major depressive disorder, recurrent episode, moderate (HCC) F33.1   3. Generalized anxiety disorder F41.1      Plan: Improve restful sleep. Begin limited walking program. Return to [facility] in 2 months due to financial constraints.  Rosary Lively, [place]

## 2018-09-12 ENCOUNTER — Telehealth: Payer: Self-pay | Admitting: Psychiatry

## 2018-09-12 ENCOUNTER — Telehealth: Payer: Self-pay

## 2018-09-12 DIAGNOSIS — F3181 Bipolar II disorder: Secondary | ICD-10-CM

## 2018-09-12 NOTE — Telephone Encounter (Signed)
Error

## 2018-09-12 NOTE — Telephone Encounter (Signed)
TC UTR LM to call back.

## 2018-09-12 NOTE — Telephone Encounter (Signed)
Pt called to report that Saphris is cost prohibitive and she would like to start samples of Vraylar. Please advise her to start Vraylar 1.5 mg po q am and to take 1/2 tab of Latuda 40 mg x 1 week and then d/c.

## 2018-09-18 NOTE — Telephone Encounter (Signed)
Left another voicemail to call back to verify she got nurses message.

## 2018-09-24 NOTE — Telephone Encounter (Signed)
Still unable to reach pt. Left voicemail but no return call back.

## 2018-10-07 DIAGNOSIS — E119 Type 2 diabetes mellitus without complications: Secondary | ICD-10-CM | POA: Diagnosis not present

## 2018-10-07 DIAGNOSIS — Z794 Long term (current) use of insulin: Secondary | ICD-10-CM | POA: Diagnosis not present

## 2018-10-12 ENCOUNTER — Emergency Department: Payer: BLUE CROSS/BLUE SHIELD

## 2018-10-12 ENCOUNTER — Emergency Department
Admission: EM | Admit: 2018-10-12 | Discharge: 2018-10-12 | Disposition: A | Discharge status: Home / Self Care | Payer: BLUE CROSS/BLUE SHIELD | Attending: Emergency Medicine | Admitting: Emergency Medicine

## 2018-10-12 ENCOUNTER — Other Ambulatory Visit: Payer: Self-pay

## 2018-10-12 DIAGNOSIS — R112 Nausea with vomiting, unspecified: Secondary | ICD-10-CM | POA: Diagnosis not present

## 2018-10-12 DIAGNOSIS — Z79899 Other long term (current) drug therapy: Secondary | ICD-10-CM | POA: Insufficient documentation

## 2018-10-12 DIAGNOSIS — R0789 Other chest pain: Secondary | ICD-10-CM | POA: Diagnosis not present

## 2018-10-12 DIAGNOSIS — R079 Chest pain, unspecified: Secondary | ICD-10-CM | POA: Diagnosis not present

## 2018-10-12 DIAGNOSIS — E119 Type 2 diabetes mellitus without complications: Secondary | ICD-10-CM | POA: Diagnosis not present

## 2018-10-12 DIAGNOSIS — M546 Pain in thoracic spine: Secondary | ICD-10-CM | POA: Diagnosis not present

## 2018-10-12 DIAGNOSIS — Z7984 Long term (current) use of oral hypoglycemic drugs: Secondary | ICD-10-CM | POA: Diagnosis not present

## 2018-10-12 DIAGNOSIS — I10 Essential (primary) hypertension: Secondary | ICD-10-CM | POA: Diagnosis not present

## 2018-10-12 LAB — BASIC METABOLIC PANEL
Anion gap: 12 (ref 5–15)
BUN: 9 mg/dL (ref 6–20)
CHLORIDE: 98 mmol/L (ref 98–111)
CO2: 28 mmol/L (ref 22–32)
CREATININE: 0.65 mg/dL (ref 0.44–1.00)
Calcium: 9.1 mg/dL (ref 8.9–10.3)
GFR calc non Af Amer: >60 mL/min (ref >60)
Glucose, Bld: 100 mg/dL — ABNORMAL HIGH (ref 70–99)
Potassium: 2.8 mmol/L — ABNORMAL LOW (ref 3.5–5.1)
SODIUM: 138 mmol/L (ref 135–145)

## 2018-10-12 LAB — CBC
HEMATOCRIT: 46.2 % — AB (ref 36.0–46.0)
HEMOGLOBIN: 15.8 g/dL — AB (ref 12.0–15.0)
MCH: 28.6 pg (ref 26.0–34.0)
MCHC: 34.2 g/dL (ref 30.0–36.0)
MCV: 83.7 fL (ref 80.0–100.0)
NRBC: 0 % (ref 0.0–0.2)
Platelets: 269 10*3/uL (ref 150–400)
RBC: 5.52 MIL/uL — ABNORMAL HIGH (ref 3.87–5.11)
RDW: 13.4 % (ref 11.5–15.5)
WBC: 9.4 10*3/uL (ref 4.0–10.5)

## 2018-10-12 LAB — TROPONIN I: Troponin I: <0.03 ng/mL (ref <0.03)

## 2018-10-12 LAB — FIBRIN DERIVATIVES D-DIMER (ARMC ONLY): FIBRIN DERIVATIVES D-DIMER (ARMC): 215.93 ng{FEU}/mL (ref 0.00–499.00)

## 2018-10-12 NOTE — ED Provider Notes (Signed)
[facility] Emergency Department Provider Note   ____________________________________________    I have reviewed the triage vital signs and the nursing notes.   HISTORY  Chief Complaint Chest Pain and Emesis     HPI Amy [name] is a 52 y.o. female with history of anxiety, diabetes, GERD, fibromyalgia who presents today with complaints of nausea and vomiting.  Patient reports that she has had left upper back discomfort for about 4 to 5 days.  She is also had a sense that she needs to take a deep breath but denies chest pain or pressure.  No calf pain or swelling.  No history of PE or DVT.  No diaphoresis.  No history of heart disease.  Today she felt a burning sensation in her chest and had an episode of vomiting.  Currently she feels better.  She does have a history of GERD and this felt similar.   Past Medical History:  Diagnosis Date  . Anemia   . Anxiety    xanax prn  . Blood transfusion without reported diagnosis   . Degenerative disc disease   . Diabetes mellitus without complication ([place])   . Fatty liver   . Fibromyalgia   . GERD (gastroesophageal reflux disease)    prilosec otc-instructed to take dos  . Hot flashes 02/19/2014  . Hypertension    controlled on hctz-bp at pat 145/94  . Neuromuscular disorder ([place])    firbrmylagia  . Obesity   . PONV (postoperative nausea and vomiting)    pt states has not had ponv but has been treated in past with scopolamine, does admit to history of motion sickness, history of vertigo  . Vertigo    recent diagnosis of vertigo-uses otc meclizine  . Weight gain 02/19/2014    Patient Active Problem List   Diagnosis Date Noted  . Social phobia 08/24/2018  . Bipolar II disorder ([place]) 08/24/2018  . Panic disorder 08/24/2018  . Night sweats 03/19/2017  . Moody 03/19/2017  . Screening for colorectal cancer 03/19/2017  . Rectocele 03/19/2017  . Rectal irritation 03/19/2017  . Hot flashes  02/19/2014  . Weight gain 02/19/2014  . Internal hemorrhoid 02/19/2014    Past Surgical History:  Procedure Laterality Date  . ABDOMINAL HYSTERECTOMY    . BREAST REDUCTION SURGERY     2010  . ECTOPIC PREGNANCY SURGERY     2002  . LAPAROSCOPIC ASSISTED VAGINAL HYSTERECTOMY  06/14/2011   Procedure: LAPAROSCOPIC ASSISTED VAGINAL HYSTERECTOMY;  Surgeon: Luz Lex, MD;  Location: [facility];  Service: Gynecology;  Laterality: N/A;  Abdomen and vagina  . LAPAROSCOPY     2006-for adhesions    Prior to Admission medications   Medication Sig Start Date End Date Taking? Authorizing Provider  ALPRAZolam Duanne Moron) 0.5 MG tablet Take 0.5 mg by mouth 4 (four) times daily as needed for anxiety.     [provider]  cariprazine (VRAYLAR) capsule Take 1 capsule (1.5 mg total) by mouth daily. (Samples provided in the event that Saphris is not covered or tolerated. Patient to contact office prior to starting) 09/11/18   Thayer Headings, PMHNP  Cholecalciferol (VITAMIN D) 2000 units CAPS Take 2,000 Units by mouth daily.    [provider]  FLUoxetine (PROZAC) 20 MG tablet Take 20 mg by mouth daily.    [provider]  hydrochlorothiazide (MICROZIDE) 12.5 MG capsule Take 12.5 mg by mouth daily.  08/05/13   [provider]  lamoTRIgine (LAMICTAL) 200 MG tablet Take  300 mg by mouth daily.    [provider]  lurasidone (LATUDA) 40 MG TABS tablet Take 40 mg by mouth daily with breakfast.    [provider]  metFORMIN (GLUCOPHAGE-XR) 500 MG 24 hr tablet Take by mouth. 07/05/18   [provider]  omeprazole (PRILOSEC OTC) 20 MG tablet Take 20 mg by mouth daily as needed.     [provider]     Allergies Hydromorphone and Ciprofloxacin  Family History  Problem Relation Age of Onset  . Cancer Paternal Uncle        liver   . Cancer Maternal Grandmother        throat  . Arthritis Mother   . Anxiety disorder Mother   . Depression Mother   .  Diabetes Father   . Hypertension Father   . Arthritis Sister   . Diabetes Maternal Grandfather   . Hypertension Maternal Grandfather   . Diabetes Paternal Grandmother   . Hypertension Paternal Grandmother   . Anxiety disorder Paternal Grandmother   . Diabetes Paternal Grandfather   . Hypertension Paternal Grandfather   . Mood Disorder Brother   . Arthritis Sister   . Panic disorder Sister   . Psychosis Maternal Uncle   . Mood Disorder Maternal Uncle   . Anxiety disorder Paternal Uncle     Social History Social History   Tobacco Use  . Smoking status: Never Smoker  . Smokeless tobacco: Never Used  Substance Use Topics  . Alcohol use: No  . Drug use: No    Review of Systems  Constitutional: No fever/chills Eyes: No visual changes.  ENT: No sore throat. Cardiovascular: As above Respiratory: Denies shortness of breath. Gastrointestinal: No abdominal pain.  No nausea, no vomiting.   Genitourinary: Negative for dysuria. Musculoskeletal: As above Skin: Negative for rash. Neurological: Negative for headaches   ____________________________________________   PHYSICAL EXAM:  VITAL SIGNS: ED Triage Vitals  Enc Vitals Group     BP 10/12/18 1848 (!) 156/83     Pulse Rate 10/12/18 1848 80     Resp 10/12/18 1848 18     Temp 10/12/18 1848 98.2 F (36.8 C)     Temp Source 10/12/18 1848 Oral     SpO2 10/12/18 1848 94 %     Weight 10/12/18 1846 102.1 kg (225 lb)     Height 10/12/18 1846 1.651 m (5' 5" )     Head Circumference --      Peak Flow --      Pain Score --      Pain Loc --      Pain Edu? --      Excl. in [place]? --     Constitutional: Alert and oriented. Eyes: Conjunctivae are normal.   Nose: No congestion/rhinnorhea. Mouth/Throat: Mucous membranes are moist.    Cardiovascular: Normal rate, regular rhythm. Grossly normal heart sounds.  Good peripheral circulation. Respiratory: Normal respiratory effort.  No retractions. Lungs CTAB. Gastrointestinal: Soft  and nontender. No distention.   Musculoskeletal: No vertebral tenderness palpation, mild tenderness over the left scapula where the patient describes her discomfort.  Warm and well perfused Neurologic:  Normal speech and language. No gross focal neurologic deficits are appreciated.  Skin:  Skin is warm, dry and intact. No rash noted. Psychiatric: Mood and affect are normal. Speech and behavior are normal.  ____________________________________________   LABS (all labs ordered are listed, but only abnormal results are displayed)  Labs Reviewed  BASIC METABOLIC PANEL - Abnormal; Notable for  the following components:      Result Value   Potassium 2.8 (*)    Glucose, Bld 100 (*)    All other components within normal limits  CBC - Abnormal; Notable for the following components:   RBC 5.52 (*)    Hemoglobin 15.8 (*)    HCT 46.2 (*)    All other components within normal limits  TROPONIN I  FIBRIN DERIVATIVES D-DIMER ([facility] ONLY)   ____________________________________________  EKG  ED ECG REPORT I, Lavonia Drafts, the attending physician, personally viewed and interpreted this ECG.  Date: 10/12/2018  Rhythm: normal sinus rhythm QRS Axis: normal Intervals: normal ST/T Wave abnormalities: normal Narrative Interpretation: no evidence of acute ischemia  ____________________________________________  RADIOLOGY  Chest x-ray unremarkable ____________________________________________   PROCEDURES  Procedure(s) performed: No  Procedures   Critical Care performed: No ____________________________________________   INITIAL IMPRESSION / ASSESSMENT AND PLAN / ED COURSE  Pertinent labs & imaging results that were available during my care of the patient were reviewed by me and considered in my medical decision making (see chart for details).  Patient presents with nausea and vomiting as described above.  She is well-appearing, vitals are overall unremarkable.  Upper back pain  appears to be musculoskeletal.  She is not tachycardic, has no pleurisy and no risk factors for PE.  We will send a d-dimer as she is low risk.  Lab work is reassuring, d-dimer is normal.  Troponin is normal.  EKG is unremarkable.  Chest x-ray is benign.  Patient feels well, she has no nausea.  She has no chest discomfort.  She is appropriate for discharge at this time, return precautions discussed.    ____________________________________________   FINAL CLINICAL IMPRESSION(S) / ED DIAGNOSES  Final diagnoses:  Non-intractable vomiting with nausea, unspecified vomiting type  Acute left-sided thoracic back pain        Note:  This document was prepared using Dragon voice recognition software and may include unintentional dictation errors.    Lavonia Drafts, MD 10/12/18 2052

## 2018-10-12 NOTE — ED Triage Notes (Signed)
Pt A&O, in wheelchair. States Tuesday began with L CP, pain between shoulder blades. Vomiting today. No diarrhea or fevers. States feels like she needs to take a deep breath. No resp distress noted. Appears anxious. Talking in complete sentences.

## 2018-10-14 DIAGNOSIS — Z6839 Body mass index (BMI) 39.0-39.9, adult: Secondary | ICD-10-CM | POA: Diagnosis not present

## 2018-10-14 DIAGNOSIS — E119 Type 2 diabetes mellitus without complications: Secondary | ICD-10-CM | POA: Diagnosis not present

## 2018-10-14 DIAGNOSIS — F419 Anxiety disorder, unspecified: Secondary | ICD-10-CM | POA: Diagnosis not present

## 2018-10-14 DIAGNOSIS — Z1389 Encounter for screening for other disorder: Secondary | ICD-10-CM | POA: Diagnosis not present

## 2018-10-14 DIAGNOSIS — K219 Gastro-esophageal reflux disease without esophagitis: Secondary | ICD-10-CM | POA: Diagnosis not present

## 2018-10-18 ENCOUNTER — Ambulatory Visit: Payer: BLUE CROSS/BLUE SHIELD | Admitting: Psychiatry

## 2018-10-18 ENCOUNTER — Encounter: Payer: Self-pay | Admitting: Psychiatry

## 2018-10-18 VITALS — BP 117/77 | HR 75

## 2018-10-18 DIAGNOSIS — F4001 Agoraphobia with panic disorder: Secondary | ICD-10-CM | POA: Diagnosis not present

## 2018-10-18 DIAGNOSIS — F3181 Bipolar II disorder: Secondary | ICD-10-CM

## 2018-10-18 DIAGNOSIS — F4010 Social phobia, unspecified: Secondary | ICD-10-CM

## 2018-10-18 DIAGNOSIS — G4700 Insomnia, unspecified: Secondary | ICD-10-CM | POA: Diagnosis not present

## 2018-10-18 NOTE — Patient Instructions (Signed)
Take Vraylar 1.5 mg one capsule in the morning daily for 2 weeks, then increase to Vraylar 3 mg one capsule in the morning.

## 2018-10-18 NOTE — Progress Notes (Signed)
Amy [name] [id number] 01/16/1966 52 y.o.  Subjective:   Patient ID:  Amy [name] is a 52 y.o. (DOB [birth year]) female.  Chief Complaint:  Chief Complaint  Patient presents with  . Anxiety  . Depression  . Sleeping Problem  . Paranoid    HPI Amy [name] presents to the office today for follow-up of mood, anxiety, or paranoia. She is accompanied by her husband. She reports that she is unsure about response to samples of Vraylar.h She reports that she as been out of Vraylar samples for approximately 2 weeks and has noticed some overall worsening in mood and anxiety recently.  They report that she awakened in the middle of the night and applied for multiple credit cards. Pt reports that she is unsure why she did this and denies any previous or subsequent similar behaviors. They deny any other impulsive or risky behaviors.   She reports that she recently accompanied her mother on a shopping trip. Pt reports that she shouted at another customer when feeling anxious in the crowds and long lines, particularly after the customer rolled her eyes because pt's mother needed a price check.   She reports that she had shoulder pain last week, then became severely anxious and started vomiting. She reports that she went to the hospital and medical w/u was negative. She reports that she has severe anxiety whenever she is in the hospital. Both pt and her husband report that whenever pt has any physical s/s she worries that it could be potentially life threatening and will go to ER to get confirmation that she is not ill.   They report that she has high anxiety and worry. She reports severe anxiety in crowds and public situations. Reports panic attacks are occurring once or twice a week.   She reports that her mood is labile. Husband reports that at times she is "snappy." Mood is typically depressed and irritable. She reports episodes of severe anger and they indicate that this  is long standing. She reports that her energy and motivation remain low and may have improved slightly with Vraylar. She reports poor sleep with middle of the night awakening. She reports that she awakened one morning "screaming and crying." She reports impaired concentration. Denies any SI in the last month.   Husband reports that pt's mood s/s significantly worsened after loss of baby and hysterectomy. He reports that she has had long-standing paranoia. He denies any periods of excessive energy or elevated mood. They both report that she has had long-standing episodes of increased anger.   Denies any other recent impulsive behaviors or spending aside from applying for credit cards.   She reports that she occasionally will think she hears her husband coming home when he is not there.   Past Medication Trials: Abilify- Irritable, restless, nausea Vraylar Latuda- Effective, then stopped working Lamotrigine  Prozac Trileptal    Review of Systems:  Review of Systems  Musculoskeletal: Negative for gait problem.  Neurological: Negative for tremors.  Psychiatric/Behavioral:       Please refer to HPI    Medications: I have reviewed the patient's current medications.  Current Outpatient Medications  Medication Sig Dispense Refill  . ALPRAZolam (XANAX) 0.5 MG tablet Take 0.5 mg by mouth 4 (four) times daily as needed for anxiety.     [name] FLUoxetine (PROZAC) 20 MG tablet Take 20 mg by mouth daily.    . hydrochlorothiazide (MICROZIDE) 12.5 MG capsule Take 12.5 mg by mouth daily.     [name]  metFORMIN (GLUCOPHAGE-XR) 500 MG 24 hr tablet Take by mouth.    [name] omeprazole (PRILOSEC OTC) 20 MG tablet Take 20 mg by mouth daily as needed.     . cariprazine (VRAYLAR) capsule Take 1 capsule (1.5 mg total) by mouth daily. (Samples provided in the event that Saphris is not covered or tolerated. Patient to contact office prior to starting) (Patient not taking: Reported on 10/18/2018) 30 capsule 0  .  Cholecalciferol (VITAMIN D) 2000 units CAPS Take 2,000 Units by mouth daily.     No current facility-administered medications for this visit.     Medication Side Effects: None  Allergies:  Allergies  Allergen Reactions  . Hydromorphone Nausea And Vomiting  . Ciprofloxacin Rash    Past Medical History:  Diagnosis Date  . Anemia   . Anxiety    xanax prn  . Blood transfusion without reported diagnosis   . Degenerative disc disease   . Diabetes mellitus without complication ([place])   . Fatty liver   . Fibromyalgia   . GERD (gastroesophageal reflux disease)    prilosec otc-instructed to take dos  . Hot flashes 02/19/2014  . Hypertension    controlled on hctz-bp at pat 145/94  . Neuromuscular disorder ([place])    firbrmylagia  . Obesity   . PONV (postoperative nausea and vomiting)    pt states has not had ponv but has been treated in past with scopolamine, does admit to history of motion sickness, history of vertigo  . Vertigo    recent diagnosis of vertigo-uses otc meclizine  . Weight gain 02/19/2014    Family History  Problem Relation Age of Onset  . Cancer Paternal Uncle        liver   . Cancer Maternal Grandmother        throat  . Arthritis Mother   . Anxiety disorder Mother   . Depression Mother   . Diabetes Father   . Hypertension Father   . Arthritis Sister   . Diabetes Maternal Grandfather   . Hypertension Maternal Grandfather   . Diabetes Paternal Grandmother   . Hypertension Paternal Grandmother   . Anxiety disorder Paternal Grandmother   . Diabetes Paternal Grandfather   . Hypertension Paternal Grandfather   . Mood Disorder Brother   . Arthritis Sister   . Panic disorder Sister   . Psychosis Maternal Uncle   . Mood Disorder Maternal Uncle   . Anxiety disorder Paternal Uncle     Social History   Socioeconomic History  . Marital status: Married    Spouse name: Not on file  . Number of children: Not on file  . Years of education: Not on file  .  Highest education level: Not on file  Occupational History  . Not on file  Social Needs  . Financial resource strain: Not on file  . Food insecurity:    Worry: Not on file    Inability: Not on file  . Transportation needs:    Medical: Not on file    Non-medical: Not on file  Tobacco Use  . Smoking status: Never Smoker  . Smokeless tobacco: Never Used  Substance and Sexual Activity  . Alcohol use: No  . Drug use: No  . Sexual activity: Yes    Birth control/protection: Surgical    Comment: hyst  Lifestyle  . Physical activity:    Days per week: Not on file    Minutes per session: Not on file  . Stress: Not on file  Relationships  .  Social connections:    Talks on phone: Not on file    Gets together: Not on file    Attends religious service: Not on file    Active member of club or organization: Not on file    Attends meetings of clubs or organizations: Not on file    Relationship status: Not on file  . Intimate partner violence:    Fear of current or ex partner: Not on file    Emotionally abused: Not on file    Physically abused: Not on file    Forced sexual activity: Not on file  Other Topics Concern  . Not on file  Social History Narrative  . Not on file    Past Medical History, Surgical history, Social history, and Family history were reviewed and updated as appropriate.   Please see review of systems for further details on the patient's review from today.   Objective:   Physical Exam:  BP 117/77   Pulse 75   LMP 05/01/2011   Physical Exam  Constitutional: She is oriented to person, place, and time. She appears well-developed. No distress.  Musculoskeletal: She exhibits no deformity.  Neurological: She is alert and oriented to person, place, and time. Coordination normal.  Psychiatric: Her speech is normal. Judgment normal. Her mood appears anxious. Her affect is angry. Her affect is not blunt, not labile and not inappropriate. She is agitated. Thought  content is paranoid. Cognition and memory are normal. She exhibits a depressed mood. She expresses no homicidal and no suicidal ideation. She expresses no suicidal plans and no homicidal plans.  Insight intact. No auditory or visual hallucinations. No delusions.     Lab Review:     Component Value Date/Time   NA 138 10/12/2018 1850   K 2.8 (L) 10/12/2018 1850   CL 98 10/12/2018 1850   CO2 28 10/12/2018 1850   GLUCOSE 100 (H) 10/12/2018 1850   BUN 9 10/12/2018 1850   CREATININE 0.65 10/12/2018 1850   CREATININE 0.69 02/19/2014 1542   CALCIUM 9.1 10/12/2018 1850   PROT 7.5 07/12/2016 2349   ALBUMIN 4.0 07/12/2016 2349   AST 31 07/12/2016 2349   ALT 45 07/12/2016 2349   ALKPHOS 88 07/12/2016 2349   BILITOT 0.8 07/12/2016 2349   GFRNONAA >60 10/12/2018 1850   GFRAA >60 10/12/2018 1850       Component Value Date/Time   WBC 9.4 10/12/2018 1850   RBC 5.52 (H) 10/12/2018 1850   HGB 15.8 (H) 10/12/2018 1850   HCT 46.2 (H) 10/12/2018 1850   PLT 269 10/12/2018 1850   MCV 83.7 10/12/2018 1850   MCH 28.6 10/12/2018 1850   MCHC 34.2 10/12/2018 1850   RDW 13.4 10/12/2018 1850   LYMPHSABS 2.1 07/12/2016 2349   MONOABS 0.6 07/12/2016 2349   EOSABS 0.1 07/12/2016 2349   BASOSABS 0.0 07/12/2016 2349    No results found for: POCLITH, LITHIUM   No results found for: PHENYTOIN, PHENOBARB, VALPROATE, CBMZ   .res Assessment: Plan:   Pt seen for 40 minutes and greater than 50% of session spent counseling pt and her husband re: mood s/s and reasons for obtaining collateral information from husband for possible diagnostic clarification. Pt and her husband report that mood , anxiety and paranoia are also significant complaints and that it is difficult to identify a chief complaint. Discussed tx options to include resuming Vraylar and then increasing to 3 mg po qd to help stabilize mood and improve psychotic s/s. Discussed that another tx  option would be to consider a mood stabilizer, such as  Carbamazepine to help decrease anger, mood lability, and possibly anxiety. Pt reports that she would prefer to resume Vraylar. Pt advised to re-start Vraylar 1.5 mg po qd x 2 weeks for mood and psychotic s/s, and then increase to 3 mg po qd. Pt provided with samples of Vraylar and copay savings card. Will continue Fluoxetine for mood and anxiety. Continue Xanax 0.5 mg po four times daily prn anxiety. Bipolar II disorder ([place])  Panic disorder with agoraphobia  Social anxiety disorder  Insomnia, unspecified type  Please see After Visit Summary for patient specific instructions.  Future Appointments  Date Time Provider [place]  11/08/2018  2:15 PM Thayer Headings, PMHNP [facility] None  11/08/2018  3:30 PM Rosary Lively, LPC [facility] None    No orders of the defined types were placed in this encounter.     -------------------------------

## 2018-11-08 ENCOUNTER — Ambulatory Visit: Payer: BLUE CROSS/BLUE SHIELD | Admitting: Mental Health

## 2018-11-08 ENCOUNTER — Encounter: Payer: Self-pay | Admitting: Psychiatry

## 2018-11-08 ENCOUNTER — Ambulatory Visit: Payer: BLUE CROSS/BLUE SHIELD | Admitting: Psychiatry

## 2018-11-08 DIAGNOSIS — F411 Generalized anxiety disorder: Secondary | ICD-10-CM | POA: Diagnosis not present

## 2018-11-08 DIAGNOSIS — F331 Major depressive disorder, recurrent, moderate: Secondary | ICD-10-CM

## 2018-11-08 DIAGNOSIS — F3181 Bipolar II disorder: Secondary | ICD-10-CM

## 2018-11-08 DIAGNOSIS — F4010 Social phobia, unspecified: Secondary | ICD-10-CM

## 2018-11-08 DIAGNOSIS — F4001 Agoraphobia with panic disorder: Secondary | ICD-10-CM | POA: Diagnosis not present

## 2018-11-08 DIAGNOSIS — G4700 Insomnia, unspecified: Secondary | ICD-10-CM

## 2018-11-08 DIAGNOSIS — F4310 Post-traumatic stress disorder, unspecified: Secondary | ICD-10-CM

## 2018-11-08 MED ORDER — CARIPRAZINE HCL 3 MG PO CAPS: 3.0000 mg | ORAL_CAPSULE | Freq: Every day | ORAL | Status: DC

## 2018-11-08 NOTE — Progress Notes (Signed)
Amy [name] [id number] [birth year] 51 y.o.  Subjective:   Patient ID:  Amy [name] is a 52 y.o. (DOB [birth year]) female.  Chief Complaint:  Chief Complaint  Patient presents with  . Other    Irritability  . Anxiety    HPI Amy [name] presents to the office today for follow-up of mood ,anxiety, and paranoia. "I guess things seem a little better." Reports that she continues to have some irritability- "maybe not as bad." reports another incident where she shouted at someone in a store that irritated her. Reports that sad mood may have improved slightly. Reports crying has decreased. Reports anxiety when she is around people and her heart will race and she will want to leave. Reports that during times of stress she will start shaking and feel hot and that Xanax seems to help. Reports that worry and anxious thoughts when she is at home has improved. Denies any recent medical visits due to intrusive thoughts that she has serious medical issue.  Reports that she has been waking up some during the night. Occasional difficulty falling asleep. Estimates sleeping 4-6 hours a night. Reports sleep is "probably about the same." Reports that she sleeps better on the nights that she takes Xanax before bedtime. Denies any change in energy or motivation and that this remains low. Appetite has been "ok" and denies any change in app. She reports poor concentration. Denies SI.  Reports paranoia is "about the same." continues to keep her house "closed up" due to thinking people may try to look in her house. Continues to sometimes think she hears husband coming home at times when he is not. No recent episodes of thinking someone is at her door. Denies VH.  Denies any recent impulsive or risky behaviors to include excessive spending. Reports that she has some thoughts of applying for credit cards but has not acted on this.   Review of Systems:  Review of Systems  Gastrointestinal:  Negative.   Musculoskeletal: Negative for gait problem.  Neurological: Positive for numbness. Negative for tremors.       Reports that she has some pain in her hip that radiates down.     Medications: I have reviewed the patient's current medications.  Current Outpatient Medications  Medication Sig Dispense Refill  . ALPRAZolam (XANAX) 0.5 MG tablet Take 0.5 mg by mouth 4 (four) times daily as needed for anxiety.     . Cholecalciferol (VITAMIN D) 2000 units CAPS Take 2,000 Units by mouth daily.    [name] FLUoxetine (PROZAC) 20 MG tablet Take 20 mg by mouth daily.    . hydrochlorothiazide (MICROZIDE) 12.5 MG capsule Take 12.5 mg by mouth daily.     . metFORMIN (GLUCOPHAGE-XR) 500 MG 24 hr tablet Take 500 mg by mouth 2 (two) times daily.     [name] omeprazole (PRILOSEC OTC) 20 MG tablet Take 20 mg by mouth daily as needed.     . cariprazine (VRAYLAR) capsule Take 1 capsule (3 mg total) by mouth daily. Pt provided with samples 42 capsule    No current facility-administered medications for this visit.     Medication Side Effects: None  Allergies:  Allergies  Allergen Reactions  . Hydromorphone Nausea And Vomiting  . Ciprofloxacin Rash    Past Medical History:  Diagnosis Date  . Anemia   . Anxiety    xanax prn  . Blood transfusion without reported diagnosis   . Degenerative disc disease   . Diabetes mellitus without complication ([place])   .  Fatty liver   . Fibromyalgia   . GERD (gastroesophageal reflux disease)    prilosec otc-instructed to take dos  . Hot flashes 02/19/2014  . Hypertension    controlled on hctz-bp at pat 145/94  . Neuromuscular disorder ([place])    firbrmylagia  . Obesity   . PONV (postoperative nausea and vomiting)    pt states has not had ponv but has been treated in past with scopolamine, does admit to history of motion sickness, history of vertigo  . Vertigo    recent diagnosis of vertigo-uses otc meclizine  . Weight gain 02/19/2014    Family History  Problem  Relation Age of Onset  . Cancer Paternal Uncle        liver   . Cancer Maternal Grandmother        throat  . Arthritis Mother   . Anxiety disorder Mother   . Depression Mother   . Diabetes Father   . Hypertension Father   . Arthritis Sister   . Diabetes Maternal Grandfather   . Hypertension Maternal Grandfather   . Diabetes Paternal Grandmother   . Hypertension Paternal Grandmother   . Anxiety disorder Paternal Grandmother   . Diabetes Paternal Grandfather   . Hypertension Paternal Grandfather   . Mood Disorder Brother   . Arthritis Sister   . Panic disorder Sister   . Psychosis Maternal Uncle   . Mood Disorder Maternal Uncle   . Anxiety disorder Paternal Uncle     Social History   Socioeconomic History  . Marital status: Married    Spouse name: Not on file  . Number of children: Not on file  . Years of education: Not on file  . Highest education level: Not on file  Occupational History  . Not on file  Social Needs  . Financial resource strain: Not on file  . Food insecurity:    Worry: Not on file    Inability: Not on file  . Transportation needs:    Medical: Not on file    Non-medical: Not on file  Tobacco Use  . Smoking status: Never Smoker  . Smokeless tobacco: Never Used  Substance and Sexual Activity  . Alcohol use: No  . Drug use: No  . Sexual activity: Yes    Birth control/protection: Surgical    Comment: hyst  Lifestyle  . Physical activity:    Days per week: Not on file    Minutes per session: Not on file  . Stress: Not on file  Relationships  . Social connections:    Talks on phone: Not on file    Gets together: Not on file    Attends religious service: Not on file    Active member of club or organization: Not on file    Attends meetings of clubs or organizations: Not on file    Relationship status: Not on file  . Intimate partner violence:    Fear of current or ex partner: Not on file    Emotionally abused: Not on file    Physically  abused: Not on file    Forced sexual activity: Not on file  Other Topics Concern  . Not on file  Social History Narrative  . Not on file    Past Medical History, Surgical history, Social history, and Family history were reviewed and updated as appropriate.   Please see review of systems for further details on the patient's review from today.   Objective:   Physical Exam:  LMP 05/01/2011  Physical Exam  Constitutional: She is oriented to person, place, and time. She appears well-developed. No distress.  Musculoskeletal: She exhibits no deformity.  Neurological: She is alert and oriented to person, place, and time. Coordination normal.  Psychiatric: Her speech is normal and behavior is normal. Judgment and thought content normal. Her mood appears anxious. Her affect is not angry, not blunt, not labile and not inappropriate. Cognition and memory are normal. She exhibits a depressed mood. She expresses no homicidal and no suicidal ideation. She expresses no suicidal plans and no homicidal plans.  Patient presents as somewhat less depressed compared to last exam.  Patient exhibits improved eye contact and more spontaneous interaction. Insight intact. No auditory or visual hallucinations. No delusions.     Lab Review:     Component Value Date/Time   NA 138 10/12/2018 1850   K 2.8 (L) 10/12/2018 1850   CL 98 10/12/2018 1850   CO2 28 10/12/2018 1850   GLUCOSE 100 (H) 10/12/2018 1850   BUN 9 10/12/2018 1850   CREATININE 0.65 10/12/2018 1850   CREATININE 0.69 02/19/2014 1542   CALCIUM 9.1 10/12/2018 1850   PROT 7.5 07/12/2016 2349   ALBUMIN 4.0 07/12/2016 2349   AST 31 07/12/2016 2349   ALT 45 07/12/2016 2349   ALKPHOS 88 07/12/2016 2349   BILITOT 0.8 07/12/2016 2349   GFRNONAA >60 10/12/2018 1850   GFRAA >60 10/12/2018 1850       Component Value Date/Time   WBC 9.4 10/12/2018 1850   RBC 5.52 (H) 10/12/2018 1850   HGB 15.8 (H) 10/12/2018 1850   HCT 46.2 (H) 10/12/2018  1850   PLT 269 10/12/2018 1850   MCV 83.7 10/12/2018 1850   MCH 28.6 10/12/2018 1850   MCHC 34.2 10/12/2018 1850   RDW 13.4 10/12/2018 1850   LYMPHSABS 2.1 07/12/2016 2349   MONOABS 0.6 07/12/2016 2349   EOSABS 0.1 07/12/2016 2349   BASOSABS 0.0 07/12/2016 2349    No results found for: POCLITH, LITHIUM   No results found for: PHENYTOIN, PHENOBARB, VALPROATE, CBMZ   .res Assessment: Plan:    Bipolar II disorder ([place]) - Some slight improvement with Vraylar- conitnue 3 mg qd since more time is needed to determine full response to this dose and patient is tolerating  - Plan: cariprazine (VRAYLAR) capsule  Panic disorder with agoraphobia - Chronic  Social anxiety disorder - Chronic  Insomnia, unspecified type  Generalized anxiety disorder - Improved  Please see After Visit Summary for patient specific instructions.  Future Appointments  Date Time Provider [place]  12/06/2018  3:15 PM Thayer Headings, PMHNP CP-[facility] None    No orders of the defined types were placed in this encounter.     -------------------------------

## 2018-11-08 NOTE — Progress Notes (Signed)
      [facility] Counselor/Therapist Progress Note  Patient ID: Amy [name], MRN: [id number],    Date: 11/08/2018  Time Spent: 45 minutes  Treatment Type: Individual Therapy  Reported Symptoms: Chronic pain from FMG, disturbed sleep, anxiety and worry about finances, depression, moodiness.  Mental Status Exam:  Appearance:   Well Groomed     Behavior:  Appropriate and Sharing  Motor:  Normal and lethargic  Speech/Language:   Normal Rate  Affect:  Depressed and Labile  Mood:  anxious, depressed and sad  Thought process:  normal  Thought content:    Paranoid Ideation and Rumination  Sensory/Perceptual disturbances:    WNL  Orientation:  oriented to person, place and time/date  Attention:  Good  Concentration:  Good  Memory:  WNL  Fund of knowledge:   Good  Insight:    Good  Judgment:   Good  Impulse Control:  Good   Risk Assessment: Danger to Self:  No Self-injurious Behavior: No Danger to Others: No Duty to Warn:no Physical Aggression / Violence:No  Access to Firearms a concern: No  Gang Involvement:No   Subjective: Spends most of the day on the sofa or in bed. Able to enjoy pet dog Mac's companionshp during the day. ADL's low. Low motivation, Depressed and anxious most days. Chronically in pain. Denies suicidal ideation.  Interventions: Cognitive Behavioral Therapy, Solution-Oriented/Positive Psychology, Interpersonal and supportive  Diagnosis:   ICD-10-CM   1. Major depressive disorder, recurrent episode, moderate (HCC) F33.1   2. PTSD (post-traumatic stress disorder) F43.10   3. Bipolar II disorder ([place]) F31.81   4. Generalized anxiety disorder F41.1     Plan: Take medication as directed            Self-care program including restful sleep and proper diet            Find  Positive and uplifting things to improve mood            Validation and support             Return to office in 4 weeks               Rosary Lively,  [facility]

## 2018-11-15 ENCOUNTER — Telehealth: Payer: Self-pay | Admitting: Adult Health

## 2018-11-15 NOTE — Telephone Encounter (Signed)
Has appt 11/20/18 has noticed a knot, has another that hurts on other side, go to ER if desired

## 2018-11-15 NOTE — Telephone Encounter (Signed)
Pt called stating that she would like a call back from [place].[facility]. Please contact pt

## 2018-11-16 ENCOUNTER — Other Ambulatory Visit: Payer: Self-pay

## 2018-11-16 ENCOUNTER — Inpatient Hospital Stay (HOSPITAL_COMMUNITY)
Admission: AD | Admit: 2018-11-16 | Discharge: 2018-11-16 | Disposition: A | Discharge status: Home / Self Care | Payer: BLUE CROSS/BLUE SHIELD | Source: Ambulatory Visit | Attending: Family Medicine | Admitting: Family Medicine

## 2018-11-16 ENCOUNTER — Encounter (HOSPITAL_COMMUNITY): Payer: Self-pay

## 2018-11-16 DIAGNOSIS — B9689 Other specified bacterial agents as the cause of diseases classified elsewhere: Secondary | ICD-10-CM | POA: Insufficient documentation

## 2018-11-16 DIAGNOSIS — Z90710 Acquired absence of both cervix and uterus: Secondary | ICD-10-CM | POA: Insufficient documentation

## 2018-11-16 DIAGNOSIS — Z79899 Other long term (current) drug therapy: Secondary | ICD-10-CM | POA: Insufficient documentation

## 2018-11-16 DIAGNOSIS — N898 Other specified noninflammatory disorders of vagina: Secondary | ICD-10-CM | POA: Diagnosis not present

## 2018-11-16 DIAGNOSIS — Z8249 Family history of ischemic heart disease and other diseases of the circulatory system: Secondary | ICD-10-CM | POA: Insufficient documentation

## 2018-11-16 DIAGNOSIS — Z818 Family history of other mental and behavioral disorders: Secondary | ICD-10-CM | POA: Diagnosis not present

## 2018-11-16 DIAGNOSIS — Z885 Allergy status to narcotic agent status: Secondary | ICD-10-CM | POA: Insufficient documentation

## 2018-11-16 DIAGNOSIS — N760 Acute vaginitis: Secondary | ICD-10-CM | POA: Diagnosis not present

## 2018-11-16 DIAGNOSIS — I10 Essential (primary) hypertension: Secondary | ICD-10-CM | POA: Diagnosis not present

## 2018-11-16 DIAGNOSIS — K219 Gastro-esophageal reflux disease without esophagitis: Secondary | ICD-10-CM | POA: Insufficient documentation

## 2018-11-16 DIAGNOSIS — E119 Type 2 diabetes mellitus without complications: Secondary | ICD-10-CM | POA: Diagnosis not present

## 2018-11-16 DIAGNOSIS — Z881 Allergy status to other antibiotic agents status: Secondary | ICD-10-CM | POA: Diagnosis not present

## 2018-11-16 DIAGNOSIS — Z7984 Long term (current) use of oral hypoglycemic drugs: Secondary | ICD-10-CM | POA: Diagnosis not present

## 2018-11-16 DIAGNOSIS — Z833 Family history of diabetes mellitus: Secondary | ICD-10-CM | POA: Diagnosis not present

## 2018-11-16 DIAGNOSIS — F419 Anxiety disorder, unspecified: Secondary | ICD-10-CM | POA: Diagnosis not present

## 2018-11-16 MED ORDER — CEPHALEXIN 500 MG PO CAPS: 500.0000 mg | ORAL_CAPSULE | Freq: Four times a day (QID) | ORAL | 0 refills | Status: DC

## 2018-11-16 NOTE — MAU Provider Note (Signed)
History     CSN: [id number]  Arrival date and time: 11/16/18 1204   First Provider Initiated Contact with Patient 11/16/18 1310      Chief Complaint  Patient presents with  . vaginal cyst   HPI  Ms.  Amy [name] is a 52 y.o. year old [id number] non-pregnant female who presents to MAU reporting she has a vaginal "knot" that has been growing in size over the past 1-2 wks. She denies pain or abnormal VB. She has a h/o hysterectomy. She has an appt to see Anderson Malta, NP on Wednesday 11/20/18 to evaluate, but she was "nervous and couldn't wait."  Past Medical History:  Diagnosis Date  . Anemia   . Anxiety    xanax prn  . Blood transfusion without reported diagnosis   . Degenerative disc disease   . Diabetes mellitus without complication ([place])   . Fatty liver   . Fibromyalgia   . GERD (gastroesophageal reflux disease)    prilosec otc-instructed to take dos  . Hot flashes 02/19/2014  . Hypertension    controlled on hctz-bp at pat 145/94  . Neuromuscular disorder ([place])    firbrmylagia  . Obesity   . PONV (postoperative nausea and vomiting)    pt states has not had ponv but has been treated in past with scopolamine, does admit to history of motion sickness, history of vertigo  . Vertigo    recent diagnosis of vertigo-uses otc meclizine  . Weight gain 02/19/2014    Past Surgical History:  Procedure Laterality Date  . ABDOMINAL HYSTERECTOMY    . BREAST REDUCTION SURGERY     2010  . ECTOPIC PREGNANCY SURGERY     2002  . LAPAROSCOPIC ASSISTED VAGINAL HYSTERECTOMY  06/14/2011   Procedure: LAPAROSCOPIC ASSISTED VAGINAL HYSTERECTOMY;  Surgeon: Luz Lex, MD;  Location: [facility];  Service: Gynecology;  Laterality: N/A;  Abdomen and vagina  . LAPAROSCOPY     2006-for adhesions    Family History  Problem Relation Age of Onset  . Cancer Paternal Uncle        liver   . Cancer Maternal Grandmother        throat  . Arthritis Mother   . Anxiety disorder Mother   .  Depression Mother   . Diabetes Father   . Hypertension Father   . Arthritis Sister   . Diabetes Maternal Grandfather   . Hypertension Maternal Grandfather   . Diabetes Paternal Grandmother   . Hypertension Paternal Grandmother   . Anxiety disorder Paternal Grandmother   . Diabetes Paternal Grandfather   . Hypertension Paternal Grandfather   . Mood Disorder Brother   . Arthritis Sister   . Panic disorder Sister   . Psychosis Maternal Uncle   . Mood Disorder Maternal Uncle   . Anxiety disorder Paternal Uncle     Social History   Tobacco Use  . Smoking status: Never Smoker  . Smokeless tobacco: Never Used  Substance Use Topics  . Alcohol use: No  . Drug use: No    Allergies:  Allergies  Allergen Reactions  . Hydromorphone Nausea And Vomiting  . Ciprofloxacin Rash    Medications Prior to Admission  Medication Sig Dispense Refill Last Dose  . ALPRAZolam (XANAX) 0.5 MG tablet Take 0.5 mg by mouth 4 (four) times daily as needed for anxiety.    11/16/2018 at Unknown time  . FLUoxetine (PROZAC) 20 MG tablet Take 20 mg by mouth daily.   11/16/2018 at Unknown time  .  hydrochlorothiazide (MICROZIDE) 12.5 MG capsule Take 12.5 mg by mouth daily.    11/16/2018 at Unknown time  . metFORMIN (GLUCOPHAGE-XR) 500 MG 24 hr tablet Take 500 mg by mouth 2 (two) times daily.    11/16/2018 at Unknown time  . omeprazole (PRILOSEC OTC) 20 MG tablet Take 20 mg by mouth daily as needed.    11/16/2018 at Unknown time  . cariprazine (VRAYLAR) capsule Take 1 capsule (3 mg total) by mouth daily. Pt provided with samples 42 capsule    . Cholecalciferol (VITAMIN D) 2000 units CAPS Take 2,000 Units by mouth daily.   Unknown at Unknown time    Review of Systems  Constitutional: Negative.   HENT: Negative.   Eyes: Negative.   Respiratory: Negative.   Cardiovascular: Negative.   Gastrointestinal: Negative.   Endocrine: Negative.   Genitourinary:       Vaginal knot  Musculoskeletal: Negative.     Skin: Negative.   Allergic/Immunologic: Negative.   Neurological: Negative.   Hematological: Negative.   Psychiatric/Behavioral: Negative.    Physical Exam   Blood pressure 125/89, pulse 96, temperature 98.1 F (36.7 C), temperature source Oral, resp. rate 18, weight 103.9 kg, last menstrual period 05/01/2011, SpO2 99 %.  Physical Exam  Nursing note and vitals reviewed. Constitutional: She is oriented to person, place, and time. She appears well-developed and well-nourished.  HENT:  Head: Normocephalic and atraumatic.  Eyes: Pupils are equal, round, and reactive to light.  Neck: Normal range of motion.  Cardiovascular: Normal rate.  Respiratory: Effort normal.  GI: Soft.  Genitourinary:       Genitourinary Comments: 0.5 cm soft, mobile, round superficial mass palpable; no tenderness, not fluctuant, no erythema or drainage   Musculoskeletal: Normal range of motion.  Neurological: She is alert and oriented to person, place, and time.  Skin: Skin is warm and dry.  Psychiatric: She has a normal mood and affect. Her behavior is normal. Judgment and thought content normal.    MAU Course  Procedures  MDM Discussed with patient that the swelling on her labia might be an infectious abscess and will tx with abx, recommend soaking in warm tub of water with Epsom salt or baking soda.   Assessment and Plan  Abscess of vagina - Plan: Discharge patient - F/U with [facility] as scheduled - Information provided on abscess - Patient verbalized an understanding of the plan of care and agrees.    Laury Deep CNM 11/16/2018, 1:10 PM

## 2018-11-16 NOTE — Discharge Instructions (Signed)
I am treating you as though you have the start of this type of cyst/abscess, but it may not be this. Please follow-up as scheduled with Anderson Malta, NP.  In late February 2020, the [facility] will be moving to the [facility] At that time, the MAU (Maternity Admissions Unit), where you are being seen today, will no longer take care of non-pregnant patients. We strongly encourage you to find a doctor's office before that time, so that you can be seen with any GYN concerns, like vaginal discharge, urinary tract infection, etc.. in a timely manner.  In order to make an office visit more convenient, the [facility] [place] [facility] will be offering evening hours with same-day appointments, walk-in appointments and scheduled appointments available during this time.  [facility] @ [facility] Hours: Monday - 8am - 7:30 pm with walk-in between 4pm- 7:30 pm Tuesday - 8 am - 5 pm open late and accepting walk-ins from 4pm - 7:30pm Wednesday - 8 am - 5 pm open late and accepting walk-ins from 4pm - 7:30pm Thursday 8 am - 5 pm open late and accepting walk-ins from 4pm - 7:30pm Friday 8 am - 12 pm  For urgent needs, [facility] is also available for management of urgent GYN complaints such as vaginal discharge or urinary tract infections.

## 2018-11-16 NOTE — MAU Note (Signed)
Amy [name] is a 52 y.o. here in MAU reporting: ?vaginal cyst/knot; endorses that it is growing in size LMP: hysterectomy Onset of complaint: ongoing for the past 1-2 weeks Pain score: denies. 0/10. Reports its more uncomfortable then painful Vitals:   11/16/18 1218  BP: 125/89  Pulse: 96  Resp: 18  Temp: 98.1 F (36.7 C)  SpO2: 99%      Lab orders placed from triage: none

## 2018-11-20 ENCOUNTER — Encounter: Payer: Self-pay | Admitting: Adult Health

## 2018-11-20 ENCOUNTER — Encounter: Payer: Self-pay | Admitting: Emergency Medicine

## 2018-11-20 ENCOUNTER — Ambulatory Visit: Payer: BLUE CROSS/BLUE SHIELD | Admitting: Adult Health

## 2018-11-20 VITALS — BP 138/90 | HR 79 | Ht 65.0 in | Wt 232.5 lb

## 2018-11-20 DIAGNOSIS — L309 Dermatitis, unspecified: Secondary | ICD-10-CM | POA: Diagnosis not present

## 2018-11-20 DIAGNOSIS — N907 Vulvar cyst: Secondary | ICD-10-CM | POA: Diagnosis not present

## 2018-11-20 DIAGNOSIS — G4700 Insomnia, unspecified: Secondary | ICD-10-CM | POA: Insufficient documentation

## 2018-11-20 MED ORDER — DOXYCYCLINE HYCLATE 100 MG PO TABS: 100.0000 mg | ORAL_TABLET | Freq: Two times a day (BID) | ORAL | 0 refills | Status: DC

## 2018-11-20 MED ORDER — MOMETASONE FUROATE 0.1 % EX OINT: TOPICAL_OINTMENT | Freq: Every day | CUTANEOUS | 1 refills | Status: DC

## 2018-11-20 MED ORDER — FLUCONAZOLE 150 MG PO TABS: ORAL_TABLET | ORAL | 1 refills | Status: DC

## 2018-11-20 NOTE — Progress Notes (Signed)
Patient ID: Amy [name], female   DOB: 1966/10/09, 52 y.o.   MRN: [id number]

## 2018-11-20 NOTE — Progress Notes (Signed)
Patient ID: BREYANNA VALERA, female   DOB: 02-27-1966, 52 y.o.   MRN: [id number] History of Present Illness:  Hampton is a 52 year old white female, married, sp hysterectomy in complaining of cyst on right labia. Was seen in MAU 12/14 and given keflex, but not taking.Had tingling in wrists.  Current Medications, Allergies, Past Medical History, Past Surgical History, Family History and Social History were reviewed in [employer] record.     Review of Systems: Has cyst right labia, tender    Physical Exam:BP 138/90 (BP Location: Left Arm, Patient Position: Sitting, Cuff Size: Normal)   Pulse 79   Ht 5' 5"  (1.651 m)   Wt 232 lb 8 oz (105.5 kg)   LMP 05/01/2011   BMI 38.69 kg/m  General:  Well developed, well nourished, no acute distress Skin:  Warm and dry Pelvic:  External genitalia has scaly red skin right labia and several areas on mons pubis.  The right inner labia has .7 cm red raised tender cyst like area and there is a smaller kissing lesion on left, both are tender, and Dr Glo Herring it to co exam.. Urethra has no lesions or masses.  Psych:  No mood changes, alert and cooperative,seems happy PHQ 2 score 0. Fall risk is low. Will rx doxycycline 100 mg 1 bid x 10 days and diflucan and elocon ointment. Do not shave, comb and clip, and do not use shower gel.   Impression: 1. Sebaceous cyst of labia   2. Eczema, unspecified type       Plan: Meds ordered this encounter  Medications  . doxycycline (VIBRA-TABS) 100 MG tablet    Sig: Take 1 tablet (100 mg total) by mouth 2 (two) times daily.    Dispense:  20 tablet    Refill:  0    Order Specific Question:   Supervising Provider    Answer:   Elonda Husky, LUTHER H [[id number]]  . fluconazole (DIFLUCAN) 150 MG tablet    Sig: Take 1 now and repeat 1 in 3 days    Dispense:  2 tablet    Refill:  1    Order Specific Question:   Supervising Provider    Answer:   EURE, LUTHER H [[id number]]  . mometasone (ELOCON) 0.1 %  ointment    Sig: Apply topically daily.    Dispense:  15 g    Refill:  1    Order Specific Question:   Supervising Provider    Answer:   Tania Ade H [[id number]]  F/U in 2 weeks with Dr Glo Herring, just in case, needs I&D.

## 2018-12-05 ENCOUNTER — Ambulatory Visit: Payer: BLUE CROSS/BLUE SHIELD | Admitting: Obstetrics and Gynecology

## 2018-12-05 ENCOUNTER — Encounter: Payer: Self-pay | Admitting: Obstetrics and Gynecology

## 2018-12-05 VITALS — BP 146/89 | HR 98 | Wt 231.8 lb

## 2018-12-05 DIAGNOSIS — Q386 Other congenital malformations of mouth: Secondary | ICD-10-CM | POA: Diagnosis not present

## 2018-12-05 NOTE — Progress Notes (Signed)
Patient ID: Amy [name], female   DOB: 1966/04/21, 53 y.o.   MRN: [id number]    [facility] Visit  @DATE @            Patient name: Amy [name] MRN [id number]  Date of birth: 11-Feb-1966  CC & HPI:  Amy [name] is a 53 y.o. female presenting today for a F/U for a sebaceous cyst on her labia. She now feels a rough patch a little bit lower when she showers. The area is not itchy. At her last visit on 11/20/2018, she was prescribed Doxycycline and has taken all of it. She was also given Mometasone but it has not helped. The patient denies fever, chills or any other symptoms or complaints at this time.   ROS:  ROS + rough patch on labia - fever - chills All systems are negative except as noted in the HPI and PMH.   Pertinent History Reviewed:   Reviewed:  Medical         Past Medical History:  Diagnosis Date  . Anemia   . Anxiety    xanax prn  . Blood transfusion without reported diagnosis   . Degenerative disc disease   . Diabetes mellitus without complication ([place])   . Fatty liver   . Fibromyalgia   . GERD (gastroesophageal reflux disease)    prilosec otc-instructed to take dos  . Hot flashes 02/19/2014  . Hypertension    controlled on hctz-bp at pat 145/94  . Neuromuscular disorder ([place])    firbrmylagia  . Obesity   . PONV (postoperative nausea and vomiting)    pt states has not had ponv but has been treated in past with scopolamine, does admit to history of motion sickness, history of vertigo  . Vertigo    recent diagnosis of vertigo-uses otc meclizine  . Weight gain 02/19/2014                              Surgical Hx:    Past Surgical History:  Procedure Laterality Date  . ABDOMINAL HYSTERECTOMY    . BREAST REDUCTION SURGERY     2010  . ECTOPIC PREGNANCY SURGERY     2002  . LAPAROSCOPIC ASSISTED VAGINAL HYSTERECTOMY  06/14/2011   Procedure: LAPAROSCOPIC ASSISTED VAGINAL HYSTERECTOMY;  Surgeon: Luz Lex, MD;  Location: [facility];  Service:  Gynecology;  Laterality: N/A;  Abdomen and vagina  . LAPAROSCOPY     2006-for adhesions   Medications: Reviewed & Updated - see associated section                       Current Outpatient Medications:  .  ALPRAZolam (XANAX) 0.5 MG tablet, Take 0.5 mg by mouth 4 (four) times daily as needed for anxiety. , Disp: , Rfl:  .  FLUoxetine (PROZAC) 20 MG tablet, Take 20 mg by mouth daily., Disp: , Rfl:  .  hydrochlorothiazide (MICROZIDE) 12.5 MG capsule, Take 12.5 mg by mouth daily. , Disp: , Rfl:  .  metFORMIN (GLUCOPHAGE-XR) 500 MG 24 hr tablet, Take 500 mg by mouth 2 (two) times daily. , Disp: , Rfl:  .  mometasone (ELOCON) 0.1 % ointment, Apply topically daily., Disp: 15 g, Rfl: 1 .  omeprazole (PRILOSEC OTC) 20 MG tablet, Take 20 mg by mouth daily as needed. , Disp: , Rfl:  .  fluconazole (DIFLUCAN) 150 MG tablet, Take 1 now and repeat 1  in 3 days (Patient not taking: Reported on 12/05/2018), Disp: 2 tablet, Rfl: 1  Social History: Reviewed -  reports that she has never smoked. She has never used smokeless tobacco.  Objective Findings:  Vitals: Blood pressure (!) 146/89, pulse 98, weight 231 lb 12.8 oz (105.1 kg), last menstrual period 05/01/2011.  PHYSICAL EXAMINATION General appearance - alert, well appearing, and in no distress, oriented to person, place, and time and overweight Mental status - alert, oriented to person, place, and time, normal mood, behavior, speech, dress, motor activity, and thought processes, affect appropriate to mood  PELVIC External genitalia - multiple small milia on left labia minora Cervix - surgically absent Uterus - surgically absent  Assessment & Plan:   A:  1. Obesity Body mass index is 38.57 kg/m. 2. Fordyce spots, benign sebaceous cyst of the labia minora  P:  1.  F/U prn  By signing my name below, I, De Burrs, attest that this documentation has been prepared under the direction and in the presence of Jonnie Kind, MD. Electronically  Signed: De Burrs, Medical Scribe. 12/05/18. 3:13 PM.  I personally performed the services described in this documentation, which was SCRIBED in my presence. The recorded information has been reviewed and considered accurate. It has been edited as necessary during review. Jonnie Kind, MD

## 2018-12-06 ENCOUNTER — Encounter: Payer: Self-pay | Admitting: Psychiatry

## 2018-12-06 ENCOUNTER — Ambulatory Visit: Payer: BLUE CROSS/BLUE SHIELD | Admitting: Psychiatry

## 2018-12-06 DIAGNOSIS — F411 Generalized anxiety disorder: Secondary | ICD-10-CM | POA: Diagnosis not present

## 2018-12-06 DIAGNOSIS — F4310 Post-traumatic stress disorder, unspecified: Secondary | ICD-10-CM

## 2018-12-06 DIAGNOSIS — F4010 Social phobia, unspecified: Secondary | ICD-10-CM

## 2018-12-06 DIAGNOSIS — F4001 Agoraphobia with panic disorder: Secondary | ICD-10-CM

## 2018-12-06 DIAGNOSIS — F3181 Bipolar II disorder: Secondary | ICD-10-CM | POA: Diagnosis not present

## 2018-12-06 MED ORDER — CARIPRAZINE HCL 3 MG PO CAPS: 3.0000 mg | ORAL_CAPSULE | Freq: Every day | ORAL | 0 refills | Status: DC

## 2018-12-06 MED ORDER — SERTRALINE HCL 50 MG PO TABS: ORAL_TABLET | ORAL | 1 refills | Status: DC

## 2018-12-06 NOTE — Progress Notes (Signed)
Amy [name] [id number] Dec 23, 1965 53 y.o.  Subjective:   Patient ID:  Amy [name] is a 53 y.o. (DOB 04-Sep-1966) female.  Chief Complaint:  Chief Complaint  Patient presents with  . Panic Attack  . Depression  . Paranoid  . Insomnia  . Anxiety    HPI Amy [name] presents to the office today for follow-up of mood, anxiety, paranoia, and insomnia. She reports that she was starting to panic on the drive here since it was raining and she was having difficulty seeing while she was driving. She reports that she has been very "picky about things." Describes recent incident at a cafeteria where she had conflict with the cashier and then screamed about everyone looking at her. She reports that she then felt everyone was staring at her at that they were not receiving the same service as others. She reports that another customer made a comment and she retorted and "made a scene." Reports that she now regrets her behavior. Reports family members have told her she was "in the wrong" and is "rude" to others.    She reports increased irritability. "I feel mean." Reports that mood remains depressed. Continues to have frequent anxiety. Reports anxiety in stores and public places. Reports some intrusive memories and re-experiencing of miscarriage and near death experience with visit to obgyn yesterday. Continues to have intrusive thoughts about family members dying. Reports that she has significant difficulty falling asleep. Estimates sleeping about 3 hours last night. Appetite varies and at times wants to eat excessively when stressed and other times does not want to eat. Reports that her energy and motivation are always low. Denies racing thoughts. Reports continued AH and has not noticed any improvement in AH. Denies any improvement in paranoia. Denies impulsive or risky behavior. Denies SI.    Past Medication Trials: Abilify- Irritable, restless, nausea Vraylar Latuda- Effective, then stopped  working Lamotrigine  Prozac Trileptal   Review of Systems:  Review of Systems  Musculoskeletal: Negative for gait problem.  Neurological: Negative for tremors.  Psychiatric/Behavioral:       Please refer to HPI    Medications: I have reviewed the patient's current medications.  Current Outpatient Medications  Medication Sig Dispense Refill  . ALPRAZolam (XANAX) 0.5 MG tablet Take 0.5 mg by mouth 4 (four) times daily as needed for anxiety.     . cariprazine (VRAYLAR) capsule Take 3 mg by mouth.    . hydrochlorothiazide (MICROZIDE) 12.5 MG capsule Take 12.5 mg by mouth daily.     . metFORMIN (GLUCOPHAGE-XR) 500 MG 24 hr tablet Take 500 mg by mouth 2 (two) times daily.     [name] omeprazole (PRILOSEC OTC) 20 MG tablet Take 20 mg by mouth daily as needed.     . cariprazine (VRAYLAR) capsule Take 1 capsule (3 mg total) by mouth daily. 30 capsule 0  . fluconazole (DIFLUCAN) 150 MG tablet Take 1 now and repeat 1 in 3 days (Patient not taking: Reported on 12/05/2018) 2 tablet 1  . mometasone (ELOCON) 0.1 % ointment Apply topically daily. 15 g 1  . sertraline (ZOLOFT) 50 MG tablet Take 1/2 tab po qd x 2-4 days, then 1 tab po qd x 1 week, then 2 tabs po qd 60 tablet 1   No current facility-administered medications for this visit.     Medication Side Effects: None  Allergies:  Allergies  Allergen Reactions  . Hydromorphone Nausea And Vomiting  . Ciprofloxacin Rash    Past Medical History:  Diagnosis Date  . Anemia   . Anxiety    xanax prn  . Blood transfusion without reported diagnosis   . Degenerative disc disease   . Diabetes mellitus without complication ([place])   . Fatty liver   . Fibromyalgia   . GERD (gastroesophageal reflux disease)    prilosec otc-instructed to take dos  . Hot flashes 02/19/2014  . Hypertension    controlled on hctz-bp at pat 145/94  . Neuromuscular disorder ([place])    firbrmylagia  . Obesity   . PONV (postoperative nausea and vomiting)    pt states has not  had ponv but has been treated in past with scopolamine, does admit to history of motion sickness, history of vertigo  . Vertigo    recent diagnosis of vertigo-uses otc meclizine  . Weight gain 02/19/2014    Family History  Problem Relation Age of Onset  . Cancer Paternal Uncle        liver   . Cancer Maternal Grandmother        throat  . Arthritis Mother   . Anxiety disorder Mother   . Depression Mother   . Diabetes Father   . Hypertension Father   . Arthritis Sister   . Diabetes Maternal Grandfather   . Hypertension Maternal Grandfather   . Diabetes Paternal Grandmother   . Hypertension Paternal Grandmother   . Anxiety disorder Paternal Grandmother   . Diabetes Paternal Grandfather   . Hypertension Paternal Grandfather   . Mood Disorder Brother   . Arthritis Sister   . Panic disorder Sister   . Psychosis Maternal Uncle   . Mood Disorder Maternal Uncle   . Anxiety disorder Paternal Uncle     Social History   Socioeconomic History  . Marital status: Married    Spouse name: Not on file  . Number of children: Not on file  . Years of education: Not on file  . Highest education level: Not on file  Occupational History  . Not on file  Social Needs  . Financial resource strain: Not on file  . Food insecurity:    Worry: Not on file    Inability: Not on file  . Transportation needs:    Medical: Not on file    Non-medical: Not on file  Tobacco Use  . Smoking status: Never Smoker  . Smokeless tobacco: Never Used  Substance and Sexual Activity  . Alcohol use: No  . Drug use: No  . Sexual activity: Yes    Birth control/protection: Surgical    Comment: hyst  Lifestyle  . Physical activity:    Days per week: Not on file    Minutes per session: Not on file  . Stress: Not on file  Relationships  . Social connections:    Talks on phone: Not on file    Gets together: Not on file    Attends religious service: Not on file    Active member of club or organization: Not  on file    Attends meetings of clubs or organizations: Not on file    Relationship status: Not on file  . Intimate partner violence:    Fear of current or ex partner: Not on file    Emotionally abused: Not on file    Physically abused: Not on file    Forced sexual activity: Not on file  Other Topics Concern  . Not on file  Social History Narrative  . Not on file    Past Medical History, Surgical history,  Social history, and Family history were reviewed and updated as appropriate.   Please see review of systems for further details on the patient's review from today.   Objective:   Physical Exam:  LMP 05/01/2011   Physical Exam Constitutional:      General: She is not in acute distress.    Appearance: She is well-developed.  Musculoskeletal:        General: No deformity.  Neurological:     Mental Status: She is alert and oriented to person, place, and time.     Coordination: Coordination normal.  Psychiatric:        Mood and Affect: Mood is anxious and depressed. Affect is labile, angry and tearful.        Speech: Speech normal.        Behavior: Behavior normal.        Thought Content: Thought content is paranoid. Thought content does not include homicidal or suicidal ideation. Thought content does not include homicidal or suicidal plan.        Judgment: Judgment normal.     Comments: Insight intact. No auditory or visual hallucinations. No delusions.      Lab Review:     Component Value Date/Time   NA 138 10/12/2018 1850   K 2.8 (L) 10/12/2018 1850   CL 98 10/12/2018 1850   CO2 28 10/12/2018 1850   GLUCOSE 100 (H) 10/12/2018 1850   BUN 9 10/12/2018 1850   CREATININE 0.65 10/12/2018 1850   CREATININE 0.69 02/19/2014 1542   CALCIUM 9.1 10/12/2018 1850   PROT 7.5 07/12/2016 2349   ALBUMIN 4.0 07/12/2016 2349   AST 31 07/12/2016 2349   ALT 45 07/12/2016 2349   ALKPHOS 88 07/12/2016 2349   BILITOT 0.8 07/12/2016 2349   GFRNONAA >60 10/12/2018 1850   GFRAA >60  10/12/2018 1850       Component Value Date/Time   WBC 9.4 10/12/2018 1850   RBC 5.52 (H) 10/12/2018 1850   HGB 15.8 (H) 10/12/2018 1850   HCT 46.2 (H) 10/12/2018 1850   PLT 269 10/12/2018 1850   MCV 83.7 10/12/2018 1850   MCH 28.6 10/12/2018 1850   MCHC 34.2 10/12/2018 1850   RDW 13.4 10/12/2018 1850   LYMPHSABS 2.1 07/12/2016 2349   MONOABS 0.6 07/12/2016 2349   EOSABS 0.1 07/12/2016 2349   BASOSABS 0.0 07/12/2016 2349    No results found for: POCLITH, LITHIUM   No results found for: PHENYTOIN, PHENOBARB, VALPROATE, CBMZ   .res Assessment: Plan:   Patient seen for 30 minutes and greater than 50% of visit spent counseling patient regarding treatment options and change in approach to treatment.  Discussed that mood and paranoia has improved only slightly with trials of several atypical antipsychotics.  Discussed that patient describes severe anxiety consistent with PTSD, panic disorder, and social anxiety, and therefore recommend specifically targeting the signs and symptoms since this could be also exacerbating irritability and depression.  Discussed potential benefits, risk, and side effects of sertraline to include potentially precipitating manic signs and symptoms.  Advised patient to contact office if she notices any worsening in possible manic signs and symptoms to include insomnia, increased irritability, impulsivity, or excessive spending.  Will start with low-dose sertraline and gradually taper to minimize risk of side effects.  Will start sertraline 25 mg daily for 2 to 4 days, then increase to 50 mg daily for 1 week, then increase to 100 mg daily for anxiety. Patient advised to stop taking Prozac.  Will continue  Vraylar 3 mg daily at this time for mood stabilization and to minimize paranoia.  Patient to follow-up in 5 weeks or sooner if clinically indicated.   PTSD (post-traumatic stress disorder) - Plan: sertraline (ZOLOFT) 50 MG tablet  Bipolar II disorder (HCC) - Plan:  cariprazine (VRAYLAR) capsule  Panic disorder with agoraphobia - Plan: sertraline (ZOLOFT) 50 MG tablet  Generalized anxiety disorder - Plan: sertraline (ZOLOFT) 50 MG tablet  Social anxiety disorder - Plan: sertraline (ZOLOFT) 50 MG tablet  Please see After Visit Summary for patient specific instructions.  Future Appointments  Date Time Provider [place]  01/10/2019  1:45 PM Thayer Headings, PMHNP [facility]-CP None    No orders of the defined types were placed in this encounter.     -------------------------------

## 2018-12-20 ENCOUNTER — Ambulatory Visit: Payer: BLUE CROSS/BLUE SHIELD | Admitting: Mental Health

## 2018-12-20 ENCOUNTER — Encounter: Payer: Self-pay | Admitting: Mental Health

## 2018-12-20 DIAGNOSIS — F411 Generalized anxiety disorder: Secondary | ICD-10-CM

## 2018-12-20 DIAGNOSIS — F3181 Bipolar II disorder: Secondary | ICD-10-CM

## 2018-12-20 NOTE — Progress Notes (Signed)
      [facility] Counselor/Therapist Progress Note  Patient ID: Amy [name], MRN: [id number],    Date: 12/20/2018  Time Spent: 45  Treatment Type: Individual Therapy  Reported Symptoms: Depressed mood, Anxious Mood, Panic Attacks, Irritability, Fatigue, Concentration impairments and Sleep disturbance  Mental Status Exam:  Appearance:   Well Groomed     Behavior:  edgy, nervous  Motor:  Normal  Speech/Language:   Normal Rate  Affect:  Depressed, Tearful and agitated  Mood:  angry, anxious, depressed, irritable and sad  Thought process:  worry  Thought content:    Obsessions  Sensory/Perceptual disturbances:    WNL  Orientation:  oriented to person, place and time/date  Attention:  Fair  Concentration:  Poor  Memory:  poor  Fund of knowledge:   Good  Insight:    Good  Judgment:   Fair  Impulse Control:  Fair   Risk Assessment: Danger to Self:  No Self-injurious Behavior: No Danger to Others: No Duty to Warn:no Physical Aggression / Violence:No  Access to Firearms a concern: No  Gang Involvement:No   Subjective: Extreme anxiety and paranoia. Has several outbursts in public recently, PTSD reactions. Worries and obsesses constantly. Isolated.  Interventions: Cognitive Behavioral Therapy, Solution-Oriented/Positive Psychology, Insight-Oriented, Interpersonal and supportive  Diagnosis:   ICD-10-CM   1. Generalized anxiety disorder F41.1   2. Bipolar II disorder ([place]) F31.81     Plan:   Increase comfort zone             Stabilize emotions             Regain restful sleep             Self care program             Validation and support  Rosary Lively, [facility]

## 2018-12-20 NOTE — Progress Notes (Deleted)
      [facility] Counselor/Therapist Progress Note  Patient ID: Amy [name], MRN: [id number],    Date: 12/20/2018  Time Spent: ***   Treatment Type: {CHL AMB THERAPY TYPES:7188134030}  Reported Symptoms: {CHL AMB [facility] COUNSELOR SYMP LIST:22089}  Mental Status Exam:  Appearance:   {PSY:22683}     Behavior:  {PSY:21022743}  Motor:  {PSY:22302}  Speech/Language:   {PSY:22685}  Affect:  {PSY:22687}  Mood:  {PSY:31886}  Thought process:  {PSY:31888}  Thought content:    {PSY:873-449-8264}  Sensory/Perceptual disturbances:    {PSY:863-515-0013}  Orientation:  {PSY:30297}  Attention:  {PSY:22877}  Concentration:  {PSY:754-311-4414}  Memory:  {PSY:567-716-1707}  Fund of knowledge:   {PSY:754-311-4414}  Insight:    {PSY:754-311-4414}  Judgment:   {PSY:754-311-4414}  Impulse Control:  {PSY:754-311-4414}   Risk Assessment: Danger to Self:  {PSY:22692} Self-injurious Behavior: {PSY:22692} Danger to Others: {PSY:22692} Duty to Warn:{PSY:311194} Physical Aggression / Violence:{PSY:21197} Access to Firearms a concern: {PSY:21197} Gang Involvement:{PSY:21197}  Subjective: ***   Interventions: {PSY:8595134787}  Diagnosis:   ICD-10-CM   1. Generalized anxiety disorder F41.1     Plan: ***  Rosary Lively, [facility]

## 2019-01-04 ENCOUNTER — Emergency Department: Payer: BLUE CROSS/BLUE SHIELD

## 2019-01-04 ENCOUNTER — Encounter: Payer: Self-pay | Admitting: Emergency Medicine

## 2019-01-04 ENCOUNTER — Other Ambulatory Visit: Payer: Self-pay

## 2019-01-04 ENCOUNTER — Emergency Department
Admission: EM | Admit: 2019-01-04 | Discharge: 2019-01-04 | Disposition: A | Discharge status: Home / Self Care | Payer: BLUE CROSS/BLUE SHIELD | Attending: Emergency Medicine | Admitting: Emergency Medicine

## 2019-01-04 DIAGNOSIS — R0602 Shortness of breath: Secondary | ICD-10-CM | POA: Diagnosis not present

## 2019-01-04 DIAGNOSIS — R079 Chest pain, unspecified: Secondary | ICD-10-CM | POA: Diagnosis not present

## 2019-01-04 DIAGNOSIS — Z79899 Other long term (current) drug therapy: Secondary | ICD-10-CM | POA: Insufficient documentation

## 2019-01-04 DIAGNOSIS — R1013 Epigastric pain: Secondary | ICD-10-CM | POA: Diagnosis not present

## 2019-01-04 DIAGNOSIS — Z7984 Long term (current) use of oral hypoglycemic drugs: Secondary | ICD-10-CM | POA: Insufficient documentation

## 2019-01-04 DIAGNOSIS — R1011 Right upper quadrant pain: Secondary | ICD-10-CM | POA: Diagnosis not present

## 2019-01-04 DIAGNOSIS — E119 Type 2 diabetes mellitus without complications: Secondary | ICD-10-CM | POA: Insufficient documentation

## 2019-01-04 DIAGNOSIS — I10 Essential (primary) hypertension: Secondary | ICD-10-CM | POA: Diagnosis not present

## 2019-01-04 DIAGNOSIS — K760 Fatty (change of) liver, not elsewhere classified: Secondary | ICD-10-CM | POA: Diagnosis not present

## 2019-01-04 DIAGNOSIS — R7989 Other specified abnormal findings of blood chemistry: Secondary | ICD-10-CM | POA: Diagnosis not present

## 2019-01-04 LAB — FIBRIN DERIVATIVES D-DIMER (ARMC ONLY): Fibrin derivatives D-dimer (ARMC): 3289.19 ng/mL (FEU) — ABNORMAL HIGH (ref 0.00–499.00)

## 2019-01-04 LAB — COMPREHENSIVE METABOLIC PANEL
ALT: 54 U/L — AB (ref 0–44)
AST: 44 U/L — AB (ref 15–41)
Albumin: 4.6 g/dL (ref 3.5–5.0)
Alkaline Phosphatase: 91 U/L (ref 38–126)
Anion gap: 9 (ref 5–15)
BUN: 10 mg/dL (ref 6–20)
CHLORIDE: 99 mmol/L (ref 98–111)
CO2: 28 mmol/L (ref 22–32)
CREATININE: 0.69 mg/dL (ref 0.44–1.00)
Calcium: 9.3 mg/dL (ref 8.9–10.3)
GFR calc Af Amer: >60 mL/min (ref >60)
GLUCOSE: 115 mg/dL — AB (ref 70–99)
Potassium: 3.2 mmol/L — ABNORMAL LOW (ref 3.5–5.1)
Sodium: 136 mmol/L (ref 135–145)
Total Bilirubin: 1 mg/dL (ref 0.3–1.2)
Total Protein: 7.8 g/dL (ref 6.5–8.1)

## 2019-01-04 LAB — CBC
HCT: 47.5 % — ABNORMAL HIGH (ref 36.0–46.0)
Hemoglobin: 16.1 g/dL — ABNORMAL HIGH (ref 12.0–15.0)
MCH: 28.4 pg (ref 26.0–34.0)
MCHC: 33.9 g/dL (ref 30.0–36.0)
MCV: 83.8 fL (ref 80.0–100.0)
Platelets: 297 10*3/uL (ref 150–400)
RBC: 5.67 MIL/uL — ABNORMAL HIGH (ref 3.87–5.11)
RDW: 13.7 % (ref 11.5–15.5)
WBC: 10.4 10*3/uL (ref 4.0–10.5)
nRBC: 0 % (ref 0.0–0.2)

## 2019-01-04 LAB — TROPONIN I: Troponin I: <0.03 ng/mL (ref <0.03)

## 2019-01-04 LAB — LIPASE, BLOOD: Lipase: 26 U/L (ref 11–51)

## 2019-01-04 MED ORDER — LORAZEPAM 2 MG/ML IJ SOLN
1.0000 mg | Freq: Once | INTRAMUSCULAR | Status: AC
  Administered 2019-01-04: 1 mg via INTRAVENOUS

## 2019-01-04 MED ORDER — SUCRALFATE 1 G PO TABS: 1.0000 g | ORAL_TABLET | Freq: Four times a day (QID) | ORAL | 1 refills | Status: DC | PRN

## 2019-01-04 MED ORDER — ACETAMINOPHEN 500 MG PO TABS
1000.0000 mg | ORAL_TABLET | Freq: Once | ORAL | Status: AC
  Administered 2019-01-04: 1000 mg via ORAL
  Filled 2019-01-04: qty 2

## 2019-01-04 MED ORDER — IOHEXOL 350 MG/ML SOLN
75.0000 mL | Freq: Once | INTRAVENOUS | Status: AC | PRN
  Administered 2019-01-04: 75 mL via INTRAVENOUS

## 2019-01-04 MED ORDER — METOCLOPRAMIDE HCL 10 MG PO TABS: 10.0000 mg | ORAL_TABLET | Freq: Three times a day (TID) | ORAL | 0 refills | Status: DC | PRN

## 2019-01-04 MED ORDER — LORAZEPAM 2 MG/ML IJ SOLN
INTRAMUSCULAR | Status: AC
  Filled 2019-01-04: qty 1

## 2019-01-04 NOTE — ED Notes (Signed)
Patient transported to Ultrasound 

## 2019-01-04 NOTE — ED Provider Notes (Signed)
[facility] Emergency Department Provider Note ____________________________________________   First MD Initiated Contact with Patient 01/04/19 1636     (approximate)  I have reviewed the triage vital signs and the nursing notes.   HISTORY  Chief Complaint Chest Pain    HPI Amy [name] is a 53 y.o. female with PMH as noted below who presents with upper abdominal pain over the last several days, constant but intermittently worsened, described as burning, and somewhat radiating up into her chest and into her back.  It is associated with shortness of breath and nausea but no vomiting or fever.  No urinary symptoms.  The patient reports a few prior episodes of pain like this once about 5 years ago and again about 2 years ago.  At some point she was told she had a "slow" gallbladder but does not have a history of gallstones.   Past Medical History:  Diagnosis Date  . Anemia   . Anxiety    xanax prn  . Blood transfusion without reported diagnosis   . Degenerative disc disease   . Diabetes mellitus without complication ([place])   . Fatty liver   . Fibromyalgia   . GERD (gastroesophageal reflux disease)    prilosec otc-instructed to take dos  . Hot flashes 02/19/2014  . Hypertension    controlled on hctz-bp at pat 145/94  . Neuromuscular disorder ([place])    firbrmylagia  . Obesity   . PONV (postoperative nausea and vomiting)    pt states has not had ponv but has been treated in past with scopolamine, does admit to history of motion sickness, history of vertigo  . Vertigo    recent diagnosis of vertigo-uses otc meclizine  . Weight gain 02/19/2014    Patient Active Problem List   Diagnosis Date Noted  . Insomnia 11/20/2018  . Abscess of vagina 11/16/2018  . Social phobia 08/24/2018  . Bipolar II disorder ([place]) 08/24/2018  . Panic disorder 08/24/2018  . Night sweats 03/19/2017  . Screening for colorectal cancer 03/19/2017  . Rectocele 03/19/2017  .  Rectal irritation 03/19/2017  . Hot flashes 02/19/2014  . Weight gain 02/19/2014  . Internal hemorrhoid 02/19/2014    Past Surgical History:  Procedure Laterality Date  . ABDOMINAL HYSTERECTOMY    . BREAST REDUCTION SURGERY     2010  . ECTOPIC PREGNANCY SURGERY     2002  . LAPAROSCOPIC ASSISTED VAGINAL HYSTERECTOMY  06/14/2011   Procedure: LAPAROSCOPIC ASSISTED VAGINAL HYSTERECTOMY;  Surgeon: Luz Lex, MD;  Location: [facility];  Service: Gynecology;  Laterality: N/A;  Abdomen and vagina  . LAPAROSCOPY     2006-for adhesions    Prior to Admission medications   Medication Sig Start Date End Date Taking? Authorizing Provider  ALPRAZolam Duanne Moron) 0.5 MG tablet Take 0.5 mg by mouth 4 (four) times daily as needed for anxiety.     [provider]  cariprazine (VRAYLAR) capsule Take 3 mg by mouth.    [provider]  cariprazine (VRAYLAR) capsule Take 1 capsule (3 mg total) by mouth daily. 12/06/18   Thayer Headings, PMHNP  fluconazole (DIFLUCAN) 150 MG tablet Take 1 now and repeat 1 in 3 days Patient not taking: Reported on 12/05/2018 11/20/18   Estill Dooms, NP  hydrochlorothiazide (MICROZIDE) 12.5 MG capsule Take 12.5 mg by mouth daily.  08/05/13   [provider]  metFORMIN (GLUCOPHAGE-XR) 500 MG 24 hr tablet Take 500 mg by mouth 2 (two) times daily.  07/05/18  [provider]  metoCLOPramide (REGLAN) 10 MG tablet Take 1 tablet (10 mg total) by mouth every 8 (eight) hours as needed for up to 10 days for nausea or vomiting. 01/04/19 01/14/19  Arta Silence, MD  mometasone (ELOCON) 0.1 % ointment Apply topically daily. 11/20/18   Estill Dooms, NP  omeprazole (PRILOSEC OTC) 20 MG tablet Take 20 mg by mouth daily as needed.     [provider]  sertraline (ZOLOFT) 50 MG tablet Take 1/2 tab po qd x 2-4 days, then 1 tab po qd x 1 week, then 2 tabs po qd 12/06/18   Thayer Headings, PMHNP  sucralfate (CARAFATE) 1 g tablet Take 1 tablet (1 g  total) by mouth 4 (four) times daily as needed for up to 15 days. 01/04/19 01/19/19  Arta Silence, MD    Allergies Hydromorphone and Ciprofloxacin  Family History  Problem Relation Age of Onset  . Cancer Paternal Uncle        liver   . Cancer Maternal Grandmother        throat  . Arthritis Mother   . Anxiety disorder Mother   . Depression Mother   . Diabetes Father   . Hypertension Father   . Arthritis Sister   . Diabetes Maternal Grandfather   . Hypertension Maternal Grandfather   . Diabetes Paternal Grandmother   . Hypertension Paternal Grandmother   . Anxiety disorder Paternal Grandmother   . Diabetes Paternal Grandfather   . Hypertension Paternal Grandfather   . Mood Disorder Brother   . Arthritis Sister   . Panic disorder Sister   . Psychosis Maternal Uncle   . Mood Disorder Maternal Uncle   . Anxiety disorder Paternal Uncle     Social History Social History   Tobacco Use  . Smoking status: Never Smoker  . Smokeless tobacco: Never Used  Substance Use Topics  . Alcohol use: No  . Drug use: No    Review of Systems  Constitutional: No fever. Eyes: No redness. ENT: No sore throat. Cardiovascular: Denies chest pain. Respiratory: Positive for shortness of breath. Gastrointestinal: Positive for nausea.  Genitourinary: Negative for dysuria.  Musculoskeletal: Positive for back pain. Skin: Negative for rash. Neurological: Negative for headache.   ____________________________________________   PHYSICAL EXAM:  VITAL SIGNS: ED Triage Vitals [01/04/19 1355]  Enc Vitals Group     BP (!) 147/92     Pulse Rate 99     Resp 20     Temp 98.4 F (36.9 C)     Temp Source Oral     SpO2 97 %     Weight 228 lb (103.4 kg)     Height 5' 5"  (1.651 m)     Head Circumference      Peak Flow      Pain Score 8     Pain Loc      Pain Edu?      Excl. in [place]?     Constitutional: Alert and oriented.  Somewhat uncomfortable appearing but in no acute  distress. Eyes: Conjunctivae are normal.  No scleral icterus. Head: Atraumatic. Nose: No congestion/rhinnorhea. Mouth/Throat: Mucous membranes are moist.   Neck: Normal range of motion.  Cardiovascular: Normal rate, regular rhythm. Grossly normal heart sounds.  Good peripheral circulation. Respiratory: Normal respiratory effort.  No retractions. Lungs CTAB. Gastrointestinal: Soft with mild right upper quadrant tenderness. No distention.  Genitourinary: No flank tenderness. Musculoskeletal: No lower extremity edema.  No calf or popliteal swelling or tenderness.  Extremities  warm and well perfused.  Neurologic:  Normal speech and language. No gross focal neurologic deficits are appreciated.  Skin:  Skin is warm and dry. No rash noted. Psychiatric: Mood and affect are normal. Speech and behavior are normal.  ____________________________________________   LABS (all labs ordered are listed, but only abnormal results are displayed)  Labs Reviewed  CBC - Abnormal; Notable for the following components:      Result Value   RBC 5.67 (*)    Hemoglobin 16.1 (*)    HCT 47.5 (*)    All other components within normal limits  COMPREHENSIVE METABOLIC PANEL - Abnormal; Notable for the following components:   Potassium 3.2 (*)    Glucose, Bld 115 (*)    AST 44 (*)    ALT 54 (*)    All other components within normal limits  FIBRIN DERIVATIVES D-DIMER ([facility] ONLY) - Abnormal; Notable for the following components:   Fibrin derivatives D-dimer ([facility]) 3,289.19 (*)    All other components within normal limits  TROPONIN I  LIPASE, BLOOD  FIBRIN DERIVATIVES D-DIMER ([facility] ONLY)  POC URINE PREG, ED   ____________________________________________  EKG  ED ECG REPORT I, Arta Silence, the attending physician, personally viewed and interpreted this ECG.  Date: 01/04/2019 EKG Time: 1350 Rate: 91 Rhythm: normal sinus rhythm QRS Axis: normal Intervals: normal ST/T Wave abnormalities:  normal Narrative Interpretation: no evidence of acute ischemia  ____________________________________________  RADIOLOGY  CXR: No focal infiltrate [place] RUQ: Normal gallbladder.  No acute abnormality ____________________________________________   PROCEDURES  Procedure(s) performed: No  Procedures  Critical Care performed: No ____________________________________________   INITIAL IMPRESSION / ASSESSMENT AND PLAN / ED COURSE  Pertinent labs & imaging results that were available during my care of the patient were reviewed by me and considered in my medical decision making (see chart for details).  53 year old female with PMH as noted above presents with right upper quadrant abdominal pain radiating up to her chest and back and associated with some shortness of breath and nausea.  The patient reports that she has had pain like this a few times before in years prior and had work-up which showed a slow or low functioning gallbladder but no stones.  On exam the patient is well-appearing although she is somewhat uncomfortable.  She is afebrile but with borderline heart rate and O2 saturation in the low to mid 90s.  She does have mild tenderness in the right upper quadrant.  The remainder of the exam is unremarkable.  Overall I suspect most likely hepatobiliary disease such as biliary colic or cholecystitis versus gastritis or PUD.  However given the borderline heart rate, the radiation of the pain to the chest and back in the component of shortness of breath, PE is also on the differential.  However, she does not have symptoms of DVT.  I do not suspect ACS and the patient's EKG and troponin are negative.  In addition to the labs obtained from triage, I will obtain d-dimer and right upper quadrant ultrasound and reassess.  ----------------------------------------- 8:39 PM on 01/04/2019 -----------------------------------------  Right upper quadrant ultrasound is negative.  The d-dimer was  elevated so I obtained a CT chest which was negative for PE or other acute findings.  Given the location of the pain and the negative imaging, the most likely remaining diagnosis would be gastritis versus possible PUD.  The patient is already on a PPI so I will add Carafate as well as nausea medicine.  She states that she will  follow-up with her gastroenterologist.  She is much more comfortable appearing and is stable for discharge home at this time.  I counseled her on the results of the work-up.  Return precautions given, and she expresses understanding. ____________________________________________   FINAL CLINICAL IMPRESSION(S) / ED DIAGNOSES  Final diagnoses:  Right upper quadrant abdominal pain  Epigastric pain      NEW MEDICATIONS STARTED DURING THIS VISIT:  New Prescriptions   METOCLOPRAMIDE (REGLAN) 10 MG TABLET    Take 1 tablet (10 mg total) by mouth every 8 (eight) hours as needed for up to 10 days for nausea or vomiting.   SUCRALFATE (CARAFATE) 1 G TABLET    Take 1 tablet (1 g total) by mouth 4 (four) times daily as needed for up to 15 days.     Note:  This document was prepared using Dragon voice recognition software and may include unintentional dictation errors.    Arta Silence, MD 01/04/19 2040

## 2019-01-04 NOTE — ED Triage Notes (Signed)
Pt here for upper abdominal pain that she describes as burning.  Also c/o burning and pressure in chest that radiates to back.  Sx X 3 days.  No fever but has had chills. Pt c/o slow gallbladder in past.  VSS. Appears anxious.  Has had SHOB, nausea, and did vomit yesterday. Pt worried about liver enzymes r/t nonalcoholic fatty liver.

## 2019-01-04 NOTE — Discharge Instructions (Signed)
Continue to take your acid blocking medication as prescribed.  You can take the Reglan as needed for nausea and the Carafate for continued abdominal discomfort.  Call your gastroenterologist on Monday to arrange for follow-up.  Return to the ER for new or worsening pain, nausea or vomiting, fevers, weakness, or any other new or worsening symptoms that concern you.

## 2019-01-10 ENCOUNTER — Ambulatory Visit: Payer: BLUE CROSS/BLUE SHIELD | Admitting: Psychiatry

## 2019-01-10 ENCOUNTER — Encounter: Payer: Self-pay | Admitting: Psychiatry

## 2019-01-10 DIAGNOSIS — F3181 Bipolar II disorder: Secondary | ICD-10-CM

## 2019-01-10 MED ORDER — SERTRALINE HCL 100 MG PO TABS: 100.0000 mg | ORAL_TABLET | Freq: Every day | ORAL | 1 refills | Status: DC

## 2019-01-10 MED ORDER — CARIPRAZINE HCL 3 MG PO CAPS: 3.0000 mg | ORAL_CAPSULE | Freq: Every day | ORAL | 0 refills | Status: DC

## 2019-01-10 NOTE — Progress Notes (Signed)
Amy [name] [id number] 1966-06-30 53 y.o.  Subjective:   Patient ID:  Amy [name] is a 53 y.o. (DOB April 01, 1966) female.  Chief Complaint:  Chief Complaint  Patient presents with  . Anxiety  . Depression  . Paranoid  . Insomnia    HPI Amy [name] presents to the office today for follow-up of mood, anxiety, insomnia, and paranoia. Pt reports multiple recent stressors that have negatively affected her mood and anxiety. She reports that her cousin died suddenly at age 31 from a CVA. Father fell in the bathtub and was having excessive bleeding that she called the paramedics and had to apply pressure until the paramedics arrived.   She reports that she has had recent anxiety about her health and having a pain in her side and being told lab value that could indicate a blood clot was elevated. She reports that she had acute anxiety at that time and was given Ativan prn. Reports that she has been feeling "overwhelmed" easily. Reports having had about 4 panic attacks in the last week. She reports continued social anxiety.   Reports that she had uncontrolled crying yesterday. Reports that she was forgetting to take her medications for the last several weeks with multiple psychosocial stressors. Notices her mood has been worse with not taking medications regularly. She reports poor concentration. Reports that she has been having "weird dreams." Reports that her energy is low and feels "exhausted."  Reports that she took Sertraline consistently in the beginning and may have noticed some improvement in mood and anxiety- "maybe not getting upset as easily." Does not recall side effects with Sertraline.   Reports that her sleep has varied. Appetite has been ok and has been avoiding foods that may be worsening GI s/s. Low energy and motivation. Denies SI.  Reports that paranoia may be increased now that she is not taking Vraylar. Denies AH or VH.   Several family members have been having health  issues.   Past Medication Trials: Abilify- Irritable, restless, nausea Vraylar Latuda- Effective, then stopped working Lamotrigine  Prozac Trileptal Sertraline  Review of Systems:  Review of Systems  Gastrointestinal: Positive for abdominal distention and abdominal pain. Negative for nausea.  Musculoskeletal: Negative for gait problem.  Neurological: Negative for tremors.  Psychiatric/Behavioral:       Please refer to HPI    Reports ER visit over the weekend for pain in her side. She reports that she had labs and CT scan.   Medications: I have reviewed the patient's current medications.  Current Outpatient Medications  Medication Sig Dispense Refill  . ALPRAZolam (XANAX) 0.5 MG tablet Take 0.5 mg by mouth 4 (four) times daily as needed for anxiety.     . hydrochlorothiazide (MICROZIDE) 12.5 MG capsule Take 12.5 mg by mouth daily.     [name] omeprazole (PRILOSEC OTC) 20 MG tablet Take 20 mg by mouth daily as needed.     . cariprazine (VRAYLAR) capsule Take 3 mg by mouth.    . cariprazine (VRAYLAR) capsule Take 1 capsule (3 mg total) by mouth daily. 30 capsule 0  . fluconazole (DIFLUCAN) 150 MG tablet Take 1 now and repeat 1 in 3 days (Patient not taking: Reported on 12/05/2018) 2 tablet 1  . metFORMIN (GLUCOPHAGE-XR) 500 MG 24 hr tablet Take 500 mg by mouth 2 (two) times daily.     . metoCLOPramide (REGLAN) 10 MG tablet Take 1 tablet (10 mg total) by mouth every 8 (eight) hours as needed for up to  10 days for nausea or vomiting. (Patient not taking: Reported on 01/10/2019) 15 tablet 0  . mometasone (ELOCON) 0.1 % ointment Apply topically daily. 15 g 1  . sertraline (ZOLOFT) 100 MG tablet Take 1 tablet (100 mg total) by mouth daily for 30 days. 30 tablet 1  . sertraline (ZOLOFT) 50 MG tablet Take 1/2 tab po qd x 2-4 days, then 1 tab po qd x 1 week, then 2 tabs po qd (Patient not taking: Reported on 01/10/2019) 60 tablet 1  . sucralfate (CARAFATE) 1 g tablet Take 1 tablet (1 g total) by mouth  4 (four) times daily as needed for up to 15 days. (Patient not taking: Reported on 01/10/2019) 30 tablet 1   No current facility-administered medications for this visit.     Medication Side Effects: None  Allergies:  Allergies  Allergen Reactions  . Hydromorphone Nausea And Vomiting  . Ciprofloxacin Rash    Past Medical History:  Diagnosis Date  . Anemia   . Anxiety    xanax prn  . Blood transfusion without reported diagnosis   . Degenerative disc disease   . Diabetes mellitus without complication ([place])   . Fatty liver   . Fibromyalgia   . GERD (gastroesophageal reflux disease)    prilosec otc-instructed to take dos  . Hot flashes 02/19/2014  . Hypertension    controlled on hctz-bp at pat 145/94  . Neuromuscular disorder ([place])    firbrmylagia  . Obesity   . PONV (postoperative nausea and vomiting)    pt states has not had ponv but has been treated in past with scopolamine, does admit to history of motion sickness, history of vertigo  . Vertigo    recent diagnosis of vertigo-uses otc meclizine  . Weight gain 02/19/2014    Family History  Problem Relation Age of Onset  . Cancer Paternal Uncle        liver   . Cancer Maternal Grandmother        throat  . Arthritis Mother   . Anxiety disorder Mother   . Depression Mother   . Diabetes Father   . Hypertension Father   . Arthritis Sister   . Diabetes Maternal Grandfather   . Hypertension Maternal Grandfather   . Diabetes Paternal Grandmother   . Hypertension Paternal Grandmother   . Anxiety disorder Paternal Grandmother   . Diabetes Paternal Grandfather   . Hypertension Paternal Grandfather   . Mood Disorder Brother   . Arthritis Sister   . Panic disorder Sister   . Psychosis Maternal Uncle   . Mood Disorder Maternal Uncle   . Anxiety disorder Paternal Uncle     Social History   Socioeconomic History  . Marital status: Married    Spouse name: Not on file  . Number of children: Not on file  . Years of  education: Not on file  . Highest education level: Not on file  Occupational History  . Not on file  Social Needs  . Financial resource strain: Not on file  . Food insecurity:    Worry: Not on file    Inability: Not on file  . Transportation needs:    Medical: Not on file    Non-medical: Not on file  Tobacco Use  . Smoking status: Never Smoker  . Smokeless tobacco: Never Used  Substance and Sexual Activity  . Alcohol use: No  . Drug use: No  . Sexual activity: Yes    Partners: Male    Birth control/protection:  Surgical    Comment: married  Lifestyle  . Physical activity:    Days per week: Not on file    Minutes per session: Not on file  . Stress: Not on file  Relationships  . Social connections:    Talks on phone: Not on file    Gets together: Not on file    Attends religious service: Not on file    Active member of club or organization: Not on file    Attends meetings of clubs or organizations: Not on file    Relationship status: Not on file  . Intimate partner violence:    Fear of current or ex partner: Not on file    Emotionally abused: Not on file    Physically abused: Not on file    Forced sexual activity: Not on file  Other Topics Concern  . Not on file  Social History Narrative  . Not on file    Past Medical History, Surgical history, Social history, and Family history were reviewed and updated as appropriate.   Please see review of systems for further details on the patient's review from today.   Objective:   Physical Exam:  BP 128/84   Pulse 78   LMP 05/01/2011   Physical Exam Constitutional:      General: She is not in acute distress.    Appearance: She is well-developed.  Musculoskeletal:        General: No deformity.  Neurological:     Mental Status: She is alert and oriented to person, place, and time.     Coordination: Coordination normal.  Psychiatric:        Attention and Perception: Attention and perception normal. She does not  perceive auditory or visual hallucinations.        Mood and Affect: Mood is anxious and depressed. Affect is not labile, blunt, angry or inappropriate.        Speech: Speech normal.        Behavior: Behavior normal.        Thought Content: Thought content is paranoid. Thought content does not include homicidal or suicidal ideation. Thought content does not include homicidal or suicidal plan.        Cognition and Memory: Cognition and memory normal.        Judgment: Judgment normal.     Comments: Insight intact. No delusions.      Lab Review:     Component Value Date/Time   NA 136 01/04/2019 1403   K 3.2 (L) 01/04/2019 1403   CL 99 01/04/2019 1403   CO2 28 01/04/2019 1403   GLUCOSE 115 (H) 01/04/2019 1403   BUN 10 01/04/2019 1403   CREATININE 0.69 01/04/2019 1403   CREATININE 0.69 02/19/2014 1542   CALCIUM 9.3 01/04/2019 1403   PROT 7.8 01/04/2019 1403   ALBUMIN 4.6 01/04/2019 1403   AST 44 (H) 01/04/2019 1403   ALT 54 (H) 01/04/2019 1403   ALKPHOS 91 01/04/2019 1403   BILITOT 1.0 01/04/2019 1403   GFRNONAA >60 01/04/2019 1403   GFRAA >60 01/04/2019 1403       Component Value Date/Time   WBC 10.4 01/04/2019 1403   RBC 5.67 (H) 01/04/2019 1403   HGB 16.1 (H) 01/04/2019 1403   HCT 47.5 (H) 01/04/2019 1403   PLT 297 01/04/2019 1403   MCV 83.8 01/04/2019 1403   MCH 28.4 01/04/2019 1403   MCHC 33.9 01/04/2019 1403   RDW 13.7 01/04/2019 1403   LYMPHSABS 2.1 07/12/2016 2349  MONOABS 0.6 07/12/2016 2349   EOSABS 0.1 07/12/2016 2349   BASOSABS 0.0 07/12/2016 2349    No results found for: POCLITH, LITHIUM   No results found for: PHENYTOIN, PHENOBARB, VALPROATE, CBMZ   .res Assessment: Plan:   Pt reports that she would like to re-start medication since she reports that her mood and anxiety have not been as stable since she has not been taking medications consistently. Will re-start Sertraline 25 mg po qd x 2-4 days, then 50 mg po qd x 1 week, then 100 mg po qd for  depression and anxiety.  Will start Vraylar 1.5 mg po qd x 1 week, then increase to 3 mg po qd to improve mood and paranoia.   Bipolar II disorder ([place]) - Plan: cariprazine (VRAYLAR) capsule  Please see After Visit Summary for patient specific instructions.  Future Appointments  Date Time Provider [place]  01/22/2019 11:00 AM Rosary Lively, [place] CP-CP None  02/21/2019 11:30 AM Thayer Headings, PMHNP CP-CP None    No orders of the defined types were placed in this encounter.     -------------------------------

## 2019-01-10 NOTE — Patient Instructions (Signed)
Take 1/2 tab of Sertraline 50 mg for 2-4 days, then take one 50 mg tablet for one week, then increase to 100 mg daily (can take two of the 50 mg tabs and a script for 100 mg tab was sent to pharmacy).  Take Vraylar 1.5 mg daily for one week, then increase to 3 mg daily.

## 2019-01-20 ENCOUNTER — Ambulatory Visit: Payer: BLUE CROSS/BLUE SHIELD | Admitting: Mental Health

## 2019-01-22 ENCOUNTER — Ambulatory Visit: Payer: BLUE CROSS/BLUE SHIELD | Admitting: Mental Health

## 2019-02-06 ENCOUNTER — Ambulatory Visit: Payer: BLUE CROSS/BLUE SHIELD | Admitting: Mental Health

## 2019-02-06 ENCOUNTER — Encounter: Payer: Self-pay | Admitting: Mental Health

## 2019-02-06 DIAGNOSIS — F3181 Bipolar II disorder: Secondary | ICD-10-CM | POA: Diagnosis not present

## 2019-02-06 DIAGNOSIS — F410 Panic disorder [episodic paroxysmal anxiety] without agoraphobia: Secondary | ICD-10-CM | POA: Diagnosis not present

## 2019-02-06 DIAGNOSIS — F4310 Post-traumatic stress disorder, unspecified: Secondary | ICD-10-CM | POA: Diagnosis not present

## 2019-02-06 NOTE — Progress Notes (Signed)
      [facility] Counselor/Therapist Progress Note  Patient ID: Amy [name], MRN: [id number],    Date: 02/06/2019  Time Spent: 45 minutes  Treatment Type: Individual Therapy  Reported Symptoms: Depressed, tearful, stressed, overwhelmed  Mental Status Exam:  Appearance:   Well Groomed     Behavior:  Agitated and tearful  Motor:  crying in session  Speech/Language:   Pressured  Affect:  Depressed and Tearful  Mood:  anxious, depressed and sad  Thought process:  goal directed and disorganized  Thought content:    Obsessions and Rumination  Sensory/Perceptual disturbances:    WNL  Orientation:  oriented to person, place and time/date  Attention:  Good  Concentration:  Fair  Memory:  [place] of knowledge:   Good  Insight:    Good  Judgment:   Good  Impulse Control:  Good   Risk Assessment: Danger to Self:  No Self-injurious Behavior: No Danger to Others: No Duty to Warn:no Physical Aggression / Violence:No  Access to Firearms a concern: No  Gang Involvement:No   Subjective:   Sad and grieving. FMG excerbated by stress. Diabetic. Whenever she leaves the house, is anxious until she returns. Hypervigilent. At restaurant has to sit with back so she can see anyone and near exit door. Nervous.  Interventions: Cognitive Behavioral Therapy, Solution-Oriented/Positive Psychology, Insight-Oriented and Interpersonal  Diagnosis:   ICD-10-CM   1. Bipolar II disorder ([name]) F31.81   2. Panic disorder without agoraphobia F41.0   3. PTSD (post-traumatic stress disorder) F43.10     Plan:    Stabilize emotions              Obtain restful sleep              Self care program              Grief recovery              Validation and support  Amy [name], [name]

## 2019-02-07 DIAGNOSIS — E119 Type 2 diabetes mellitus without complications: Secondary | ICD-10-CM | POA: Diagnosis not present

## 2019-02-07 DIAGNOSIS — Z6839 Body mass index (BMI) 39.0-39.9, adult: Secondary | ICD-10-CM | POA: Diagnosis not present

## 2019-02-07 DIAGNOSIS — E6609 Other obesity due to excess calories: Secondary | ICD-10-CM | POA: Diagnosis not present

## 2019-02-10 ENCOUNTER — Encounter: Payer: Self-pay | Admitting: Mental Health

## 2019-02-21 ENCOUNTER — Ambulatory Visit: Payer: BLUE CROSS/BLUE SHIELD | Admitting: Psychiatry

## 2019-02-26 ENCOUNTER — Ambulatory Visit (INDEPENDENT_AMBULATORY_CARE_PROVIDER_SITE_OTHER): Payer: BLUE CROSS/BLUE SHIELD | Admitting: Psychiatry

## 2019-02-26 ENCOUNTER — Other Ambulatory Visit: Payer: Self-pay

## 2019-02-26 DIAGNOSIS — F4310 Post-traumatic stress disorder, unspecified: Secondary | ICD-10-CM

## 2019-02-26 DIAGNOSIS — F4001 Agoraphobia with panic disorder: Secondary | ICD-10-CM | POA: Diagnosis not present

## 2019-02-26 DIAGNOSIS — F411 Generalized anxiety disorder: Secondary | ICD-10-CM | POA: Diagnosis not present

## 2019-02-26 DIAGNOSIS — F3181 Bipolar II disorder: Secondary | ICD-10-CM

## 2019-02-26 DIAGNOSIS — F4010 Social phobia, unspecified: Secondary | ICD-10-CM

## 2019-02-26 MED ORDER — SERTRALINE HCL 50 MG PO TABS: 50.0000 mg | ORAL_TABLET | Freq: Every day | ORAL | 0 refills | Status: DC

## 2019-02-26 MED ORDER — BUSPIRONE HCL 15 MG PO TABS: ORAL_TABLET | ORAL | 1 refills | Status: DC

## 2019-02-26 NOTE — Progress Notes (Signed)
Amy [name] [id number] Sep 02, 1966 53 y.o.  Subjective:   Patient ID:  Amy [name] is a 53 y.o. (DOB 17-Jul-1966) female.  Virtual Visit via Telephone Note  I connected with pt by telephone and verified that I am speaking with the correct person using two identifiers.   I discussed the limitations, risks, security and privacy concerns of performing an evaluation and management service by telephone and the availability of in person appointments. I also discussed with the patient that there may be a patient responsible charge related to this service. The patient expressed understanding and agreed to proceed.  I discussed the assessment and treatment plan with the patient. The patient was provided an opportunity to ask questions and all were answered. The patient agreed with the plan and demonstrated an understanding of the instructions.   The patient was advised to call back or seek an in-person evaluation if the symptoms worsen or if the condition fails to improve as anticipated.  I provided 30 minutes of non-face-to-face time during this encounter. The call started at 11:15 and ended at 11:45 am. The patient was located at home and the provider was located [facility].  Chief Complaint:  Chief Complaint  Patient presents with  . Anxiety  . Depression  . Paranoid  . Insomnia    HPI Amy [name] presents to the office today for follow-up of depression, anxiety, insomnia, and paranoia.   Reports that she has been "messed up on the Vraylar again" and has not been taking it regularly. She reports that she was not taking it regularly and still has some samples at home. She reports that when she increased Sertaline to 100 mg po qd she felt more irritable and had tingling around her mouth. She reports that she stopped Sertraline and then re-started 50 mg po qd about 2 weeks ago.   She reports that she has not been sleeping well and is having multiple middle of the night  awakenings. Reports that she is having nightmares about losing her baby. She reports that she does not have the nightmares every night. Reports that she has moments where she fixates on the loss of her baby and has intrusive memories and flashbacks.   She reports that she continues to have social anxiety and felt "angry" when her husband insisted they go out to eat for her birthday. Reports that she has also had anxiety about contracting COVID 19 and that this has kept her from many of her activities and delayed medical care. Reports that she will compulsively scratch at her scalp and that she has sores on her scalp as a result and reports that she also did this in her 20's. Reports some increased worry about her parents, especially since she is unable to see them regularly.   She reports that her mood is "about the same... continue to have mood swings." Reports frequent sad mood. She reports that her energy and motivation have been low. Reports that she is behind on household chores. She reports poor concentration and focus. Reports that she frequently has to write things down. She reports that she has only been wanting to eat certain foods, like applesauce and bacon.  Denies SI.   She reports increased paranoia.  Re-started Vraylar about 10 days ago.  Past Medication Trials: Abilify- Irritable, restless, nausea Vraylar Latuda- Effective, then stopped working Lamotrigine  Prozac Trileptal Sertraline  Review of Systems:  Review of Systems  Musculoskeletal: Positive for back pain.  Reports that she fell and was sore on her right ankle and back and this has improved.  Skin:       Reports skin lesions on chest  Neurological: Negative for tremors.    Medications: I have reviewed the patient's current medications.  Current Outpatient Medications  Medication Sig Dispense Refill  . ALPRAZolam (XANAX) 0.5 MG tablet Take 0.5 mg by mouth 4 (four) times daily as needed for anxiety.     .  cariprazine (VRAYLAR) capsule Take 3 mg by mouth.    . hydrochlorothiazide (MICROZIDE) 12.5 MG capsule Take 12.5 mg by mouth daily.     . metFORMIN (GLUCOPHAGE-XR) 500 MG 24 hr tablet Take 500 mg by mouth 2 (two) times daily.     . busPIRone (BUSPAR) 15 MG tablet Take 1/3 tablet p.o. twice daily for 1 week, then take 2/3 tablet p.o. twice daily for 1 week, then take 1 tablet p.o. twice daily 60 tablet 1  . cariprazine (VRAYLAR) capsule Take 1 capsule (3 mg total) by mouth daily. 30 capsule 0  . fluconazole (DIFLUCAN) 150 MG tablet Take 1 now and repeat 1 in 3 days (Patient not taking: Reported on 12/05/2018) 2 tablet 1  . metoCLOPramide (REGLAN) 10 MG tablet Take 1 tablet (10 mg total) by mouth every 8 (eight) hours as needed for up to 10 days for nausea or vomiting. (Patient not taking: Reported on 01/10/2019) 15 tablet 0  . mometasone (ELOCON) 0.1 % ointment Apply topically daily. 15 g 1  . omeprazole (PRILOSEC OTC) 20 MG tablet Take 20 mg by mouth daily as needed.     . sertraline (ZOLOFT) 50 MG tablet Take 1 tablet (50 mg total) by mouth daily. 90 tablet 0  . sucralfate (CARAFATE) 1 g tablet Take 1 tablet (1 g total) by mouth 4 (four) times daily as needed for up to 15 days. (Patient not taking: Reported on 01/10/2019) 30 tablet 1   No current facility-administered medications for this visit.     Medication Side Effects: Other: Increased irritability with Sertraline 100 mg qd  Allergies:  Allergies  Allergen Reactions  . Hydromorphone Nausea And Vomiting  . Ciprofloxacin Rash    Past Medical History:  Diagnosis Date  . Anemia   . Anxiety    xanax prn  . Blood transfusion without reported diagnosis   . Degenerative disc disease   . Diabetes mellitus without complication ([place])   . Fatty liver   . Fibromyalgia   . GERD (gastroesophageal reflux disease)    prilosec otc-instructed to take dos  . Hot flashes 02/19/2014  . Hypertension    controlled on hctz-bp at pat 145/94  .  Neuromuscular disorder ([place])    firbrmylagia  . Obesity   . PONV (postoperative nausea and vomiting)    pt states has not had ponv but has been treated in past with scopolamine, does admit to history of motion sickness, history of vertigo  . Vertigo    recent diagnosis of vertigo-uses otc meclizine  . Weight gain 02/19/2014    Family History  Problem Relation Age of Onset  . Cancer Paternal Uncle        liver   . Cancer Maternal Grandmother        throat  . Arthritis Mother   . Anxiety disorder Mother   . Depression Mother   . Diabetes Father   . Hypertension Father   . Arthritis Sister   . Diabetes Maternal Grandfather   . Hypertension Maternal Grandfather   .  Diabetes Paternal Grandmother   . Hypertension Paternal Grandmother   . Anxiety disorder Paternal Grandmother   . Diabetes Paternal Grandfather   . Hypertension Paternal Grandfather   . Mood Disorder Brother   . Arthritis Sister   . Panic disorder Sister   . Psychosis Maternal Uncle   . Mood Disorder Maternal Uncle   . Anxiety disorder Paternal Uncle     Social History   Socioeconomic History  . Marital status: Married    Spouse name: Not on file  . Number of children: Not on file  . Years of education: Not on file  . Highest education level: Not on file  Occupational History  . Not on file  Social Needs  . Financial resource strain: Not on file  . Food insecurity:    Worry: Not on file    Inability: Not on file  . Transportation needs:    Medical: Not on file    Non-medical: Not on file  Tobacco Use  . Smoking status: Never Smoker  . Smokeless tobacco: Never Used  Substance and Sexual Activity  . Alcohol use: No  . Drug use: No  . Sexual activity: Yes    Partners: Male    Birth control/protection: Surgical    Comment: married  Lifestyle  . Physical activity:    Days per week: Not on file    Minutes per session: Not on file  . Stress: Not on file  Relationships  . Social connections:     Talks on phone: Not on file    Gets together: Not on file    Attends religious service: Not on file    Active member of club or organization: Not on file    Attends meetings of clubs or organizations: Not on file    Relationship status: Not on file  . Intimate partner violence:    Fear of current or ex partner: Not on file    Emotionally abused: Not on file    Physically abused: Not on file    Forced sexual activity: Not on file  Other Topics Concern  . Not on file  Social History Narrative  . Not on file    Past Medical History, Surgical history, Social history, and Family history were reviewed and updated as appropriate.   Please see review of systems for further details on the patient's review from today.   Objective:   Physical Exam:  LMP 05/01/2011   Physical Exam Neurological:     Mental Status: She is alert and oriented to person, place, and time.  Psychiatric:        Attention and Perception: Attention normal.        Mood and Affect: Mood is anxious and depressed.        Speech: Speech normal.        Behavior: Behavior is cooperative.        Thought Content: Thought content is paranoid. Thought content does not include homicidal or suicidal ideation.        Cognition and Memory: Cognition and memory normal.        Judgment: Judgment normal.     Lab Review:     Component Value Date/Time   NA 136 01/04/2019 1403   K 3.2 (L) 01/04/2019 1403   CL 99 01/04/2019 1403   CO2 28 01/04/2019 1403   GLUCOSE 115 (H) 01/04/2019 1403   BUN 10 01/04/2019 1403   CREATININE 0.69 01/04/2019 1403   CREATININE 0.69 02/19/2014 1542  CALCIUM 9.3 01/04/2019 1403   PROT 7.8 01/04/2019 1403   ALBUMIN 4.6 01/04/2019 1403   AST 44 (H) 01/04/2019 1403   ALT 54 (H) 01/04/2019 1403   ALKPHOS 91 01/04/2019 1403   BILITOT 1.0 01/04/2019 1403   GFRNONAA >60 01/04/2019 1403   GFRAA >60 01/04/2019 1403       Component Value Date/Time   WBC 10.4 01/04/2019 1403   RBC 5.67 (H)  01/04/2019 1403   HGB 16.1 (H) 01/04/2019 1403   HCT 47.5 (H) 01/04/2019 1403   PLT 297 01/04/2019 1403   MCV 83.8 01/04/2019 1403   MCH 28.4 01/04/2019 1403   MCHC 33.9 01/04/2019 1403   RDW 13.7 01/04/2019 1403   LYMPHSABS 2.1 07/12/2016 2349   MONOABS 0.6 07/12/2016 2349   EOSABS 0.1 07/12/2016 2349   BASOSABS 0.0 07/12/2016 2349    No results found for: POCLITH, LITHIUM   No results found for: PHENYTOIN, PHENOBARB, VALPROATE, CBMZ   .res Assessment: Plan:   Discussed potential benefits, risks, and side effects of Buspar. Pt agrees to trial of Buspar. Start BuSpar 15 mg 1/3 tablet twice daily for 1 week, then increase to 2/3 tablet twice daily for 1 week, then increase to 1 tablet twice daily for anxiety.  Pt agrees to take Sertraline and Vraylar more consistently. Continue Sertraline 50 mg po qd for mood and anxiety.  Continue Vraylar 3 mg po qd for mood s/s.   Recommend continuing therapy with Rosary Lively, LPC. Patient advised to contact office with any questions, adverse effects, or acute worsening in signs and symptoms.  PTSD (post-traumatic stress disorder) - Plan: busPIRone (BUSPAR) 15 MG tablet, sertraline (ZOLOFT) 50 MG tablet  Generalized anxiety disorder - Plan: busPIRone (BUSPAR) 15 MG tablet, sertraline (ZOLOFT) 50 MG tablet  Bipolar II disorder ([facility])  Panic disorder with agoraphobia - Plan: sertraline (ZOLOFT) 50 MG tablet  Social anxiety disorder - Plan: sertraline (ZOLOFT) 50 MG tablet  Please see After Visit Summary for patient specific instructions.  Future Appointments  Date Time Provider [place]  04/09/2019 11:30 AM Thayer Headings, PMHNP CP-CP None    No orders of the defined types were placed in this encounter.     -------------------------------

## 2019-02-28 ENCOUNTER — Encounter: Payer: Self-pay | Admitting: Psychiatry

## 2019-03-29 ENCOUNTER — Telehealth: Payer: Self-pay | Admitting: Psychiatry

## 2019-03-29 DIAGNOSIS — F4010 Social phobia, unspecified: Secondary | ICD-10-CM

## 2019-03-29 MED ORDER — PROPRANOLOL HCL 10 MG PO TABS: ORAL_TABLET | ORAL | 1 refills | Status: DC

## 2019-03-29 NOTE — Telephone Encounter (Signed)
Received after hours call from pt who reports that she stopped taking all of her medications several days ago due to twitching. She reports that this started recently and notices that she has had  difficulty getting still and feels "I can't get situated."   She reports, "I've been really having had a hard time today." She reports that she has been having frequent flashbacks the last few days.   She denies SI. Reports mood has been depressed.   Discussed that she may be experiencing akathisia with Vraylar.  Recommended re-starting Sertraline and holding Vraylar.  Discussed potential benefits, risks, and side effects of propanolol as needed for akathisia and anxiety.  Discussed that propanolol works for approximately 4 to 6 hours and may offer some immediate relief from akathisia.  Discussed that Arman Filter has a long half-life and therefore blood levels of Vraylar will gradually decrease and it may take time before she notices improvement in akathisia.  Recommended that patient call back if she does not experience and improvement in akathisia or she has worsening in signs and symptoms.  Patient agrees to do so.  Prescription for propanolol 10 mg 1-2 tabs p.o. twice daily PRN anxiety or akathisia sent to [facility] in [place].

## 2019-03-31 DIAGNOSIS — E669 Obesity, unspecified: Secondary | ICD-10-CM | POA: Diagnosis not present

## 2019-03-31 DIAGNOSIS — F419 Anxiety disorder, unspecified: Secondary | ICD-10-CM | POA: Diagnosis not present

## 2019-03-31 DIAGNOSIS — Z6839 Body mass index (BMI) 39.0-39.9, adult: Secondary | ICD-10-CM | POA: Diagnosis not present

## 2019-03-31 DIAGNOSIS — E1165 Type 2 diabetes mellitus with hyperglycemia: Secondary | ICD-10-CM | POA: Diagnosis not present

## 2019-03-31 DIAGNOSIS — Z1389 Encounter for screening for other disorder: Secondary | ICD-10-CM | POA: Diagnosis not present

## 2019-04-03 ENCOUNTER — Other Ambulatory Visit: Payer: Self-pay

## 2019-04-03 ENCOUNTER — Encounter: Payer: Self-pay | Admitting: Mental Health

## 2019-04-03 ENCOUNTER — Ambulatory Visit (INDEPENDENT_AMBULATORY_CARE_PROVIDER_SITE_OTHER): Payer: BLUE CROSS/BLUE SHIELD | Admitting: Mental Health

## 2019-04-03 DIAGNOSIS — F4010 Social phobia, unspecified: Secondary | ICD-10-CM

## 2019-04-03 DIAGNOSIS — F4310 Post-traumatic stress disorder, unspecified: Secondary | ICD-10-CM

## 2019-04-03 DIAGNOSIS — F319 Bipolar disorder, unspecified: Secondary | ICD-10-CM

## 2019-04-03 NOTE — Progress Notes (Signed)
[facility] Counselor/Therapist Progress Note  Patient ID: Amy [name], MRN: [id number],    Date: 04/03/2019   I connected with patient by a video enabled telemedicine application or telephone, with their informed consent, and verified patient privacy and that I am speaking with the correct person using two identifiers.  I was located at home and patient at home. Corona Virus Pandemic. 11:00 PM   Time Spent: 50 minutes  Treatment Type: Individual Therapy  Reported Symptoms: Panic attacks daily, interrupted sleep, grieving over loss of child and Mother's Day coming up. Constantly crying. Distraught. Has had twitching and jerking with Vraylar. D/C Vraylar.  Mental Status Exam:  Appearance:   unseen - telephone     Behavior:  Agitated  Motor:  Restlestness  Speech/Language:   constant tearfulness  Affect:  Labile and Tearful  Mood:  angry, anxious, irritable, labile and sad  Thought process:  circumstantial  Thought content:    Obsessions and Rumination  Sensory/Perceptual disturbances:    WNL  Orientation:  oriented to person, place and time/date  Attention:  Fair  Concentration:  Fair  Memory:  [place] of knowledge:   Good  Insight:    Good  Judgment:   Good  Impulse Control:  Good   Risk Assessment: Danger to Self:  No Self-injurious Behavior: No Danger to Others: No Duty to Warn:no Physical Aggression / Violence:No  Access to Firearms a concern: No  Gang Involvement:No   Subjective: Hysterically crying on the phone. Very upset and severely depressed and anxious. As Mother's Day approach, all of her emotions of loss are triggered. Remembers crying a lot as a child for being so nervous. IAt times uninttelligible. Has been very stressed about finances as well.  Interventions: Solution-Oriented/Positive Psychology, Insight-Oriented, Family Systems and Interpersonal  Diagnosis:   ICD-10-CM   1. Bipolar I disorder ([place]) F31.9   2. PTSD (post-traumatic stress  disorder) F43.10   3. Social anxiety disorder F40.10       Treatment Plan   Patient Name: Amy [name]   Date: April 03, 2019   Didactic topic to be discussed:           Anxiety:                   Locus of control                              Work/Life balance           Depression                             Problem-solving                              Relationships                                   Boundaries                                     Coping srategies  Communication                    Recovery from trauma                    Self-care                                     Validation  Other: Bereavement     Goals:  Patient  1. Maintains mood stabiity:  decreased symptoms of     depression     anxiety  2.   Practices pro-active self-care:   restful sleep, nutrition, exercise, socialization  3.   Effective utilizes boundaries and sets limits  4.   Utliizes coping strategies and problem solving techniques for stress management  5.   Feels accurately heard, understood and validated  Other: Bereavement      Logan Bores [place], [facility]

## 2019-04-08 ENCOUNTER — Ambulatory Visit (INDEPENDENT_AMBULATORY_CARE_PROVIDER_SITE_OTHER): Payer: BLUE CROSS/BLUE SHIELD | Admitting: Mental Health

## 2019-04-08 ENCOUNTER — Encounter: Payer: Self-pay | Admitting: Mental Health

## 2019-04-08 ENCOUNTER — Other Ambulatory Visit: Payer: Self-pay

## 2019-04-08 DIAGNOSIS — F4310 Post-traumatic stress disorder, unspecified: Secondary | ICD-10-CM | POA: Diagnosis not present

## 2019-04-08 DIAGNOSIS — F3181 Bipolar II disorder: Secondary | ICD-10-CM | POA: Diagnosis not present

## 2019-04-08 DIAGNOSIS — F4010 Social phobia, unspecified: Secondary | ICD-10-CM | POA: Diagnosis not present

## 2019-04-08 NOTE — Progress Notes (Addendum)
[facility] Counselor/Therapist Progress Note  Patient ID: RHEALYN CULLEN, MRN: [id number],    Date: 04/08/2019   I connected with patient by a video enabled telemedicine application or telephone, with their informed consent, and verified patient privacy and that I am speaking with the correct person using two identifiers.  I was located at home and patient at home. Corona Virus Pandemic. 10:00 AM   Time Spent:  45 minutes  Treatment Type: Individual Therapy  Reported Symptoms: Having a better day today. Last session she was severely depressed.  Mental Status Exam:  Appearance:   unseen     Behavior:  Appropriate  Motor:  Normal  Speech/Language:   Normal Rate  Affect:  Appropriate  Mood:  anxious and depressed  Thought process:  normal  Thought content:    bereavement  Sensory/Perceptual disturbances:    WNL  Orientation:  oriented to person, place and time/date  Attention:  Good  Concentration:  Good  Memory:  WNL  Fund of knowledge:   Good  Insight:    Good  Judgment:   Good  Impulse Control:  Good   Risk Assessment: Danger to Self:  No Self-injurious Behavior: No Danger to Others: No Duty to Warn:no Physical Aggression / Violence:No  Access to Firearms a concern: No  Gang Involvement:No   Subjective:   Feels more upbeat and hopeful today. Mood varies from severely depressed to mostly okay. Today is better day. Able to enjoy today.Still in grief. Traumatized my loss of child years ago.At times distraught. Often tearful.  Interventions: Solution-Oriented/Positive Psychology, Insight-Oriented and Interpersonal  Diagnosis:   ICD-10-CM   1. Bipolar II disorder ([place]) F31.81   2. PTSD (post-traumatic stress disorder) F43.10   3. Social anxiety disorder F40.10       Treatment Plan   Patient Name:  Sher Shampine   Date: Apr 08, 2019   Didactic topic to be discussed:           Anxiety:                   Locus of control                               Work/Life balance           Depression                             Problem-solving                              Relationships                                   Boundaries                                     Coping srategies                             Communication                    Recovery from trauma  Self-care                                     Validation  Other     Goals:  Patient  1. Maintains mood stabiity:  decreased symptoms of     depression     anxiety  2.   Practices pro-active self-care:   restful sleep, nutrition, exercise, socialization  3.   Effective utilizes boundaries and sets limits  4.   Utliizes coping strategies and problem solving techniques for stress management  5.   Feels accurately heard, understood and validated  Other: Bereavement      Logan Bores [facility], [facility]

## 2019-04-09 ENCOUNTER — Ambulatory Visit (INDEPENDENT_AMBULATORY_CARE_PROVIDER_SITE_OTHER): Payer: BLUE CROSS/BLUE SHIELD | Admitting: Psychiatry

## 2019-04-09 ENCOUNTER — Other Ambulatory Visit: Payer: Self-pay

## 2019-04-09 ENCOUNTER — Encounter: Payer: Self-pay | Admitting: Psychiatry

## 2019-04-09 DIAGNOSIS — F4310 Post-traumatic stress disorder, unspecified: Secondary | ICD-10-CM | POA: Diagnosis not present

## 2019-04-09 DIAGNOSIS — F4001 Agoraphobia with panic disorder: Secondary | ICD-10-CM | POA: Diagnosis not present

## 2019-04-09 DIAGNOSIS — F4010 Social phobia, unspecified: Secondary | ICD-10-CM | POA: Diagnosis not present

## 2019-04-09 DIAGNOSIS — F411 Generalized anxiety disorder: Secondary | ICD-10-CM | POA: Diagnosis not present

## 2019-04-09 MED ORDER — SERTRALINE HCL 100 MG PO TABS: 100.0000 mg | ORAL_TABLET | Freq: Every day | ORAL | 1 refills | Status: DC

## 2019-04-09 NOTE — Progress Notes (Signed)
Amy [name] [id number] [birth year] 53 y.o.  Virtual Visit via Telephone Note  I connected with pt on 04/09/19 at 11:30 AM EDT by telephone and verified that I am speaking with the correct person using two identifiers.   I discussed the limitations, risks, security and privacy concerns of performing an evaluation and management service by telephone and the availability of in person appointments. I also discussed with the patient that there may be a patient responsible charge related to this service. The patient expressed understanding and agreed to proceed.   I discussed the assessment and treatment plan with the patient. The patient was provided an opportunity to ask questions and all were answered. The patient agreed with the plan and demonstrated an understanding of the instructions.   The patient was advised to call back or seek an in-person evaluation if the symptoms worsen or if the condition fails to improve as anticipated.  I provided 30 minutes of non-face-to-face time during this encounter.  The patient was located at home.  The provider was located at home.   Thayer Headings, PMHNP   Subjective:   Patient ID:  Amy [name] is a 53 y.o. (DOB November 26, 1966) female.  Chief Complaint:  Chief Complaint  Patient presents with  . Anxiety  . Depression  . Insomnia    HPI [name] presents for follow-up of anxiety, mood instability, insomnia, and psychosis. She reports that her restlessness and twitching has improved somewhat. She reports that she has been wanting to sleep more during the daytime. She reports that she will sleep 4-5 hours at night and can sleep about 3 hours during the day.  She reports anxiety about going to a medical apt tomorrow without her husband. She reports that her anxiety is "about the same." Reports that she has difficulty tolerating loud noises. Reports exaggerated startle response. Reports that Sertraline has been most helpful for her anxiety.  Reports that she ruminates on experiences where she perceives people are rude to her. Reports that she has been having some panic attacks. Reports anxiety about contracting COVID and has been avoiding foods made in [place], [place], and other areas affected more by the pandemic. She reports that she continues to have depression and that this is "about the same." She reports that her appetite varies. Energy and motivation have been low. She reports poor concentration. States she will have to read something several times before fully comprehending material. Reports occ vague passive death wishes. Denies SI.  Reports frequently scratching her scalp, particularly at night.  Reports that she had misplaced paper work recently and had severe irritability related to this.   Reports that she has AH of husband's truck. She reports that she occ thinks she hears the phone ringing when there is not a call. Possible VH yesterday of seeing a dog in their fenced in yard. Reports some paranoia. Reports that family often says she perceives others are wronging her when they are not.   "Good days and bad days."  Denies any risky or impulsive behavior. Reports that she has had desire to shop/spend but has not acted on this.  Past Medication Trials: Abilify- Irritable, restless, nausea Vraylar Latuda- Effective, then stopped working Lamotrigine  Prozac Trileptal Sertraline Propranolol- effective for akathisia. May have been helpful for anxiety. Buspar- "did not like the way it made me feel"   Review of Systems:  Review of Systems  Gastrointestinal: Positive for abdominal pain.  Musculoskeletal: Negative for gait problem.  Neurological: Negative for  tremors.  Psychiatric/Behavioral:       Please refer to HPI    Medications: I have reviewed the patient's current medications.  Current Outpatient Medications  Medication Sig Dispense Refill  . ALPRAZolam (XANAX) 0.5 MG tablet Take 0.5 mg by mouth 4 (four) times  daily as needed for anxiety.     . hydrochlorothiazide (HYDRODIURIL) 50 MG tablet Take 50 mg by mouth daily.     . metFORMIN (GLUCOPHAGE-XR) 500 MG 24 hr tablet Take 500 mg by mouth 2 (two) times daily.     [name] omeprazole (PRILOSEC OTC) 20 MG tablet Take 20 mg by mouth daily as needed.     . propranolol (INDERAL) 10 MG tablet Take 1-2 tabs po BID prn restlessness or anxiety 120 tablet 1  . sertraline (ZOLOFT) 50 MG tablet Take 1 tablet (50 mg total) by mouth daily. 90 tablet 0  . fluconazole (DIFLUCAN) 150 MG tablet Take 1 now and repeat 1 in 3 days (Patient not taking: Reported on 12/05/2018) 2 tablet 1  . metoCLOPramide (REGLAN) 10 MG tablet Take 1 tablet (10 mg total) by mouth every 8 (eight) hours as needed for up to 10 days for nausea or vomiting. (Patient not taking: Reported on 01/10/2019) 15 tablet 0  . mometasone (ELOCON) 0.1 % ointment Apply topically daily. 15 g 1  . sertraline (ZOLOFT) 100 MG tablet Take 1 tablet (100 mg total) by mouth daily for 30 days. 30 tablet 1  . sucralfate (CARAFATE) 1 g tablet Take 1 tablet (1 g total) by mouth 4 (four) times daily as needed for up to 15 days. (Patient not taking: Reported on 01/10/2019) 30 tablet 1   No current facility-administered medications for this visit.     Medication Side Effects: None  Allergies:  Allergies  Allergen Reactions  . Hydromorphone Nausea And Vomiting  . Ciprofloxacin Rash    Past Medical History:  Diagnosis Date  . Anemia   . Anxiety    xanax prn  . Blood transfusion without reported diagnosis   . Degenerative disc disease   . Diabetes mellitus without complication ([place])   . Fatty liver   . Fibromyalgia   . GERD (gastroesophageal reflux disease)    prilosec otc-instructed to take dos  . Hot flashes 02/19/2014  . Hypertension    controlled on hctz-bp at pat 145/94  . Neuromuscular disorder ([place])    firbrmylagia  . Obesity   . PONV (postoperative nausea and vomiting)    pt states has not had ponv but has  been treated in past with scopolamine, does admit to history of motion sickness, history of vertigo  . Vertigo    recent diagnosis of vertigo-uses otc meclizine  . Weight gain 02/19/2014    Family History  Problem Relation Age of Onset  . Cancer Paternal Uncle        liver   . Cancer Maternal Grandmother        throat  . Arthritis Mother   . Anxiety disorder Mother   . Depression Mother   . Diabetes Father   . Hypertension Father   . Arthritis Sister   . Diabetes Maternal Grandfather   . Hypertension Maternal Grandfather   . Diabetes Paternal Grandmother   . Hypertension Paternal Grandmother   . Anxiety disorder Paternal Grandmother   . Diabetes Paternal Grandfather   . Hypertension Paternal Grandfather   . Mood Disorder Brother   . Arthritis Sister   . Panic disorder Sister   . Psychosis Maternal  Uncle   . Mood Disorder Maternal Uncle   . Anxiety disorder Paternal Uncle     Social History   Socioeconomic History  . Marital status: Married    Spouse name: Not on file  . Number of children: Not on file  . Years of education: Not on file  . Highest education level: Not on file  Occupational History  . Not on file  Social Needs  . Financial resource strain: Not on file  . Food insecurity:    Worry: Not on file    Inability: Not on file  . Transportation needs:    Medical: Not on file    Non-medical: Not on file  Tobacco Use  . Smoking status: Never Smoker  . Smokeless tobacco: Never Used  Substance and Sexual Activity  . Alcohol use: No  . Drug use: No  . Sexual activity: Yes    Partners: Male    Birth control/protection: Surgical    Comment: married  Lifestyle  . Physical activity:    Days per week: Not on file    Minutes per session: Not on file  . Stress: Not on file  Relationships  . Social connections:    Talks on phone: Not on file    Gets together: Not on file    Attends religious service: Not on file    Active member of club or organization:  Not on file    Attends meetings of clubs or organizations: Not on file    Relationship status: Not on file  . Intimate partner violence:    Fear of current or ex partner: Not on file    Emotionally abused: Not on file    Physically abused: Not on file    Forced sexual activity: Not on file  Other Topics Concern  . Not on file  Social History Narrative  . Not on file    Past Medical History, Surgical history, Social history, and Family history were reviewed and updated as appropriate.   Please see review of systems for further details on the patient's review from today.   Objective:   Physical Exam:  LMP 05/01/2011   Physical Exam Neurological:     Mental Status: She is alert and oriented to person, place, and time.     Cranial Nerves: No dysarthria.  Psychiatric:        Attention and Perception: Attention normal.        Mood and Affect: Mood is anxious and depressed.        Speech: Speech normal.        Behavior: Behavior is cooperative.        Thought Content: Thought content is paranoid. Thought content is not delusional. Thought content does not include homicidal or suicidal ideation. Thought content does not include homicidal or suicidal plan.        Cognition and Memory: Cognition and memory normal.        Judgment: Judgment is inappropriate.     Lab Review:     Component Value Date/Time   NA 136 01/04/2019 1403   K 3.2 (L) 01/04/2019 1403   CL 99 01/04/2019 1403   CO2 28 01/04/2019 1403   GLUCOSE 115 (H) 01/04/2019 1403   BUN 10 01/04/2019 1403   CREATININE 0.69 01/04/2019 1403   CREATININE 0.69 02/19/2014 1542   CALCIUM 9.3 01/04/2019 1403   PROT 7.8 01/04/2019 1403   ALBUMIN 4.6 01/04/2019 1403   AST 44 (H) 01/04/2019 1403   ALT 54 (  H) 01/04/2019 1403   ALKPHOS 91 01/04/2019 1403   BILITOT 1.0 01/04/2019 1403   GFRNONAA >60 01/04/2019 1403   GFRAA >60 01/04/2019 1403       Component Value Date/Time   WBC 10.4 01/04/2019 1403   RBC 5.67 (H)  01/04/2019 1403   HGB 16.1 (H) 01/04/2019 1403   HCT 47.5 (H) 01/04/2019 1403   PLT 297 01/04/2019 1403   MCV 83.8 01/04/2019 1403   MCH 28.4 01/04/2019 1403   MCHC 33.9 01/04/2019 1403   RDW 13.7 01/04/2019 1403   LYMPHSABS 2.1 07/12/2016 2349   MONOABS 0.6 07/12/2016 2349   EOSABS 0.1 07/12/2016 2349   BASOSABS 0.0 07/12/2016 2349    No results found for: POCLITH, LITHIUM   No results found for: PHENYTOIN, PHENOBARB, VALPROATE, CBMZ   .res Assessment: Plan:   Discussed potential benefits, risk, and side effects of increasing sertraline to 100 mg daily to improve anxiety and mood.  Patient agrees to increase in sertraline. Continue propanolol and Xanax as needed for anxiety. Recommend continuing to see Rosary Lively, [facility] for psychotherapy. Patient to follow-up with this provider in 4 weeks or sooner if clinically indicated. Patient advised to contact office with any questions, adverse effects, or acute worsening in signs and symptoms.  PTSD (post-traumatic stress disorder) - Plan: sertraline (ZOLOFT) 100 MG tablet  Panic disorder with agoraphobia - Plan: sertraline (ZOLOFT) 100 MG tablet  Generalized anxiety disorder - Plan: sertraline (ZOLOFT) 100 MG tablet  Social anxiety disorder - Plan: sertraline (ZOLOFT) 100 MG tablet  Please see After Visit Summary for patient specific instructions.  Future Appointments  Date Time Provider [place]  04/22/2019 11:00 AM Rosary Lively, [facility] CP-CP None  05/07/2019 11:00 AM Thayer Headings, PMHNP CP-CP None    No orders of the defined types were placed in this encounter.     -------------------------------

## 2019-04-11 ENCOUNTER — Ambulatory Visit: Payer: Self-pay | Admitting: *Deleted

## 2019-04-11 NOTE — Telephone Encounter (Signed)
Pt called with having body aches, chills and headache. She feels like she does not have a fever (have not checked temp), or shortness of breath.  She has a cough but (has acid reflux which makes her cough). Advised that she monitor her symptoms and go to the hospital if she starts feeling worst. She has not been around anyone that has been sick or tested positive for the coronavirus. Pt voiced understanding. Advised that she go the [employer] and do the assessment and make an e visit. Pt voice understanding.  Reason for Disposition . [1] COVID-19 infection diagnosed or suspected AND [2] mild symptoms (fever, cough) AND [3] no trouble breathing or other complications  Answer Assessment - Initial Assessment Questions 1. COVID-19 DIAGNOSIS: "Who made your Coronavirus (COVID-19) diagnosis?" "Was it confirmed by a positive lab test?" If not diagnosed by a HCP, ask "Are there lots of cases (community spread) where you live?" (See public health department website, if unsure)   * MAJOR community spread: high number of cases; numbers of cases are increasing; many people hospitalized.   * MINOR community spread: low number of cases; not increasing; few or no people hospitalized     In the community 2. ONSET: "When did the COVID-19 symptoms start?"      This morning 3. WORST SYMPTOM: "What is your worst symptom?" (e.g., cough, fever, shortness of breath, muscle aches)     Body aches  4. COUGH: "Do you have a cough?" If so, ask: "How bad is the cough?"       Cough but not new 5. FEVER: "Do you have a fever?" If so, ask: "What is your temperature, how was it measured, and when did it start?"     No fever 6. RESPIRATORY STATUS: "Describe your breathing?" (e.g., shortness of breath, wheezing, unable to speak)      normal 7. BETTER-SAME-WORSE: "Are you getting better, staying the same or getting worse compared to yesterday?"  If getting worse, ask, "In what way?"     Worst today 8. HIGH RISK DISEASE: "Do  you have any chronic medical problems?" (e.g., asthma, heart or lung disease, weak immune system, etc.)     Diabetes and high blood pressure  9. PREGNANCY: "Is there any chance you are pregnant?" "When was your last menstrual period?"     Not pregnant. hysterectomy in 2012 10. OTHER SYMPTOMS: "Do you have any other symptoms?"  (e.g., runny nose, headache, sore throat, loss of smell)       Headache, tired  Protocols used: CORONAVIRUS (COVID-19) DIAGNOSED OR SUSPECTED-A-AH

## 2019-04-14 DIAGNOSIS — D2239 Melanocytic nevi of other parts of face: Secondary | ICD-10-CM | POA: Diagnosis not present

## 2019-04-14 DIAGNOSIS — D2221 Melanocytic nevi of right ear and external auricular canal: Secondary | ICD-10-CM | POA: Diagnosis not present

## 2019-04-14 DIAGNOSIS — D224 Melanocytic nevi of scalp and neck: Secondary | ICD-10-CM | POA: Diagnosis not present

## 2019-04-14 DIAGNOSIS — L918 Other hypertrophic disorders of the skin: Secondary | ICD-10-CM | POA: Diagnosis not present

## 2019-04-17 DIAGNOSIS — E559 Vitamin D deficiency, unspecified: Secondary | ICD-10-CM | POA: Diagnosis not present

## 2019-04-17 DIAGNOSIS — E039 Hypothyroidism, unspecified: Secondary | ICD-10-CM | POA: Diagnosis not present

## 2019-04-17 DIAGNOSIS — E538 Deficiency of other specified B group vitamins: Secondary | ICD-10-CM | POA: Diagnosis not present

## 2019-04-22 ENCOUNTER — Ambulatory Visit: Payer: BLUE CROSS/BLUE SHIELD | Admitting: Mental Health

## 2019-05-07 ENCOUNTER — Ambulatory Visit: Payer: BLUE CROSS/BLUE SHIELD | Admitting: Psychiatry

## 2019-05-07 IMAGING — CR DG CHEST 2V
2 series · 2 of 2 positions shown · non-contrast
Comparison: 10/12/2018

CLINICAL DATA: Nonsmoker with epigastric pain and bloating for 3
days.

EXAM:
CHEST - 2 VIEW

[chest pa]
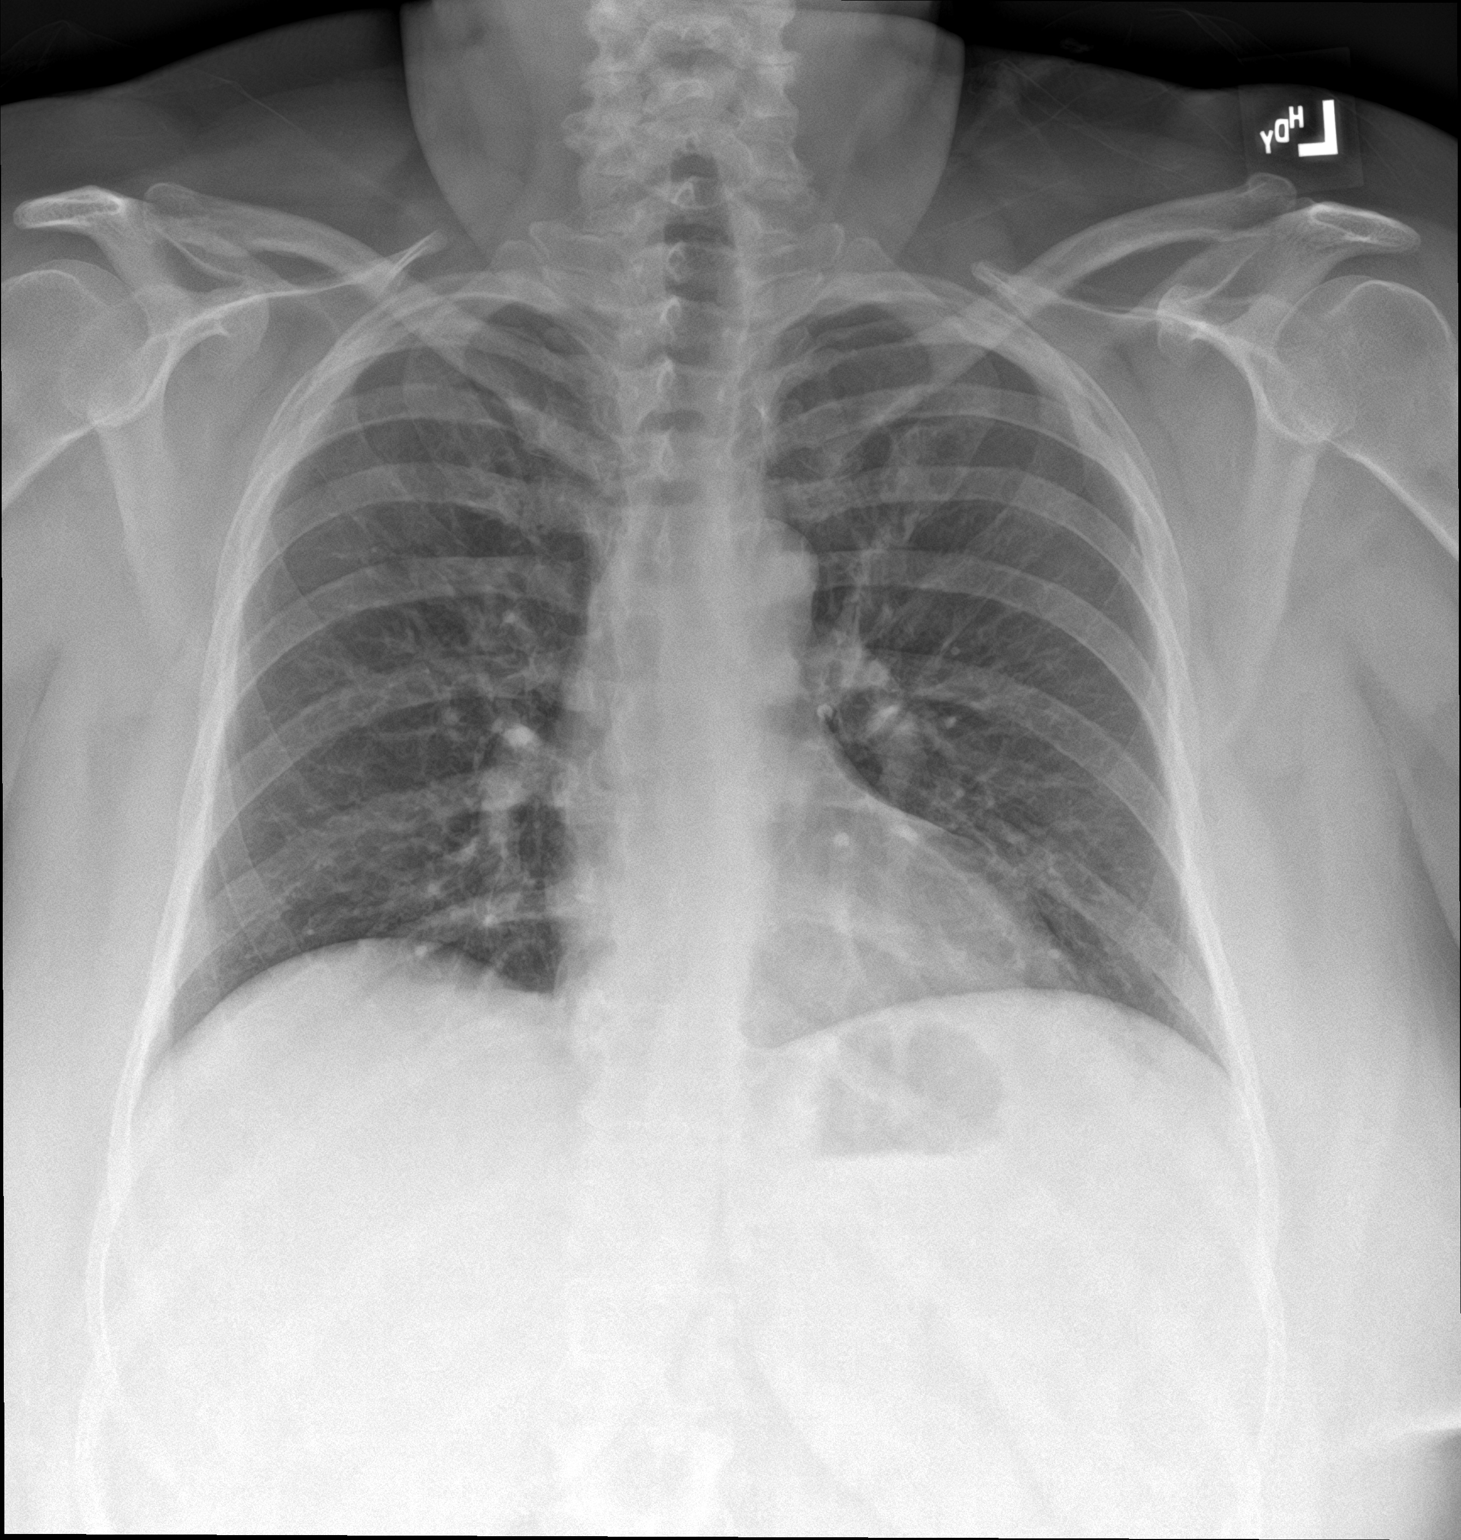

[chest lat]
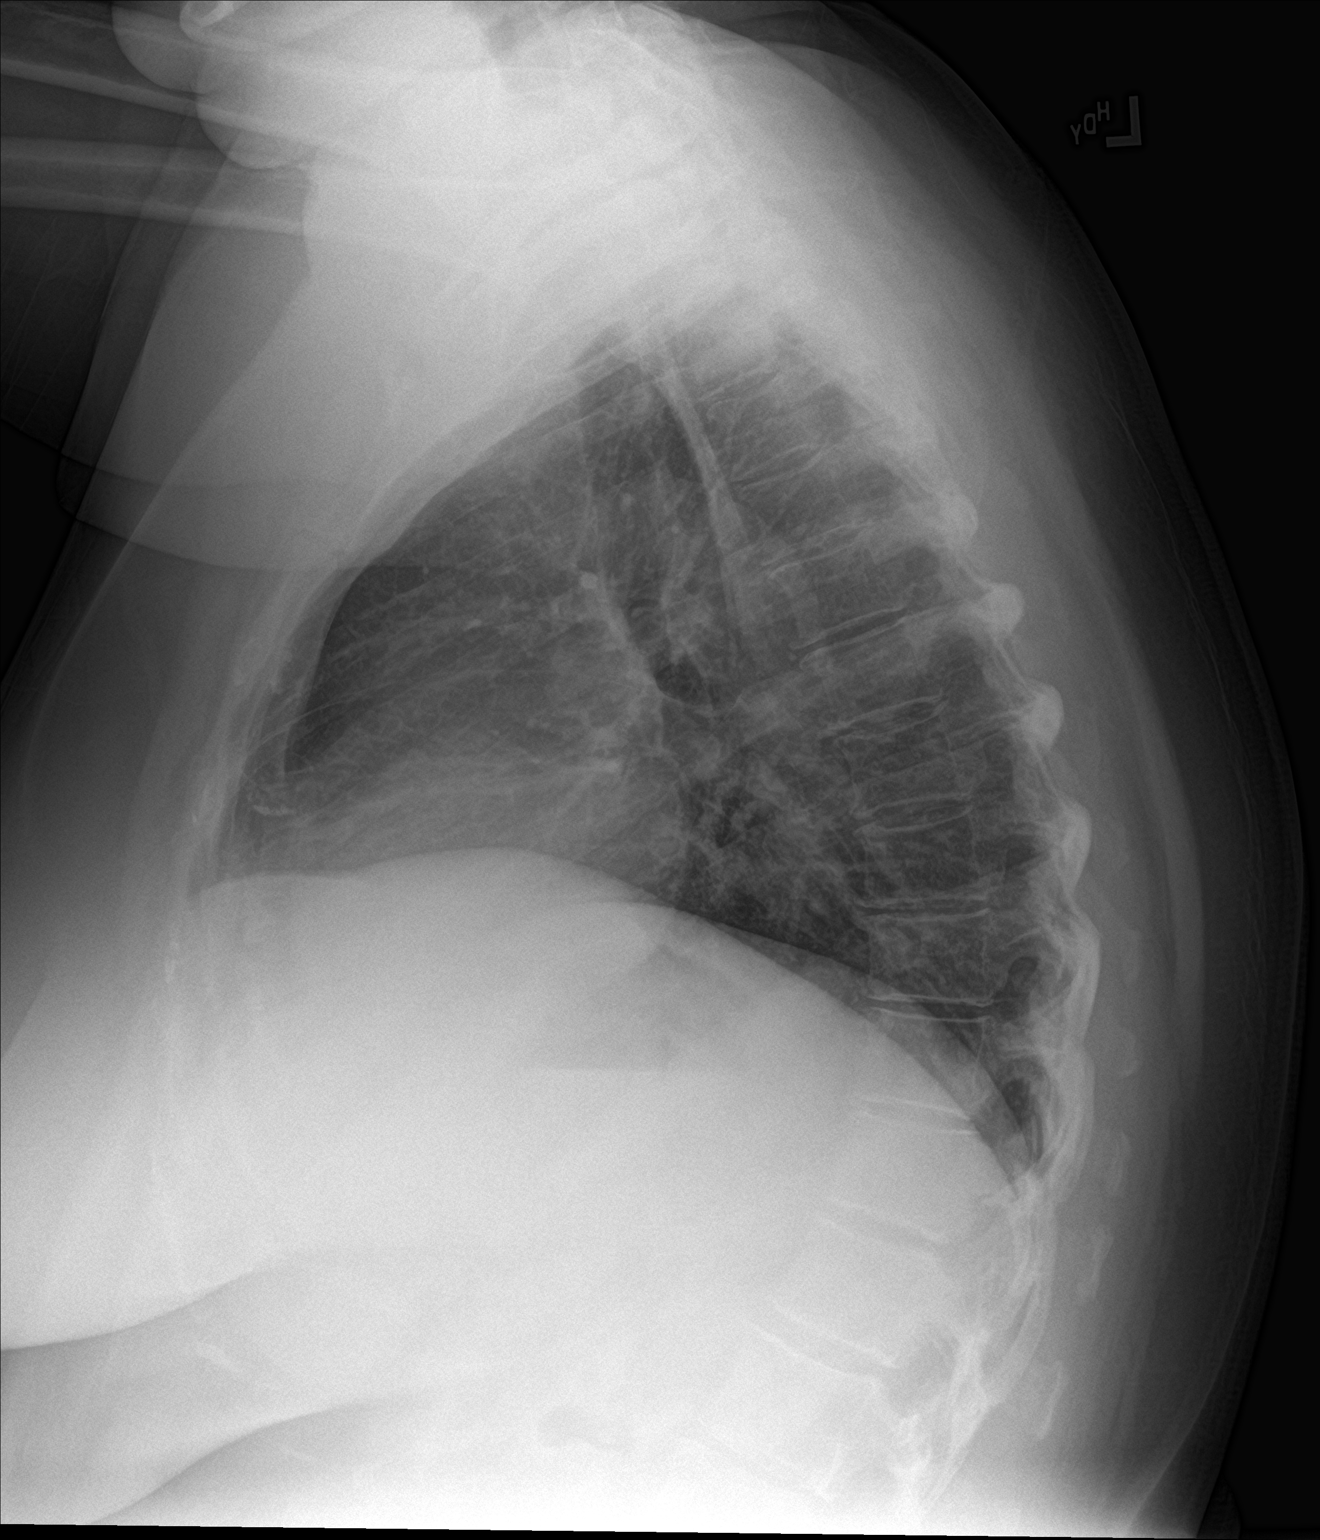

[2 of 2 positions shown; findings below may reference images not displayed]

FINDINGS: Normal heart size. Lungs clear. No pneumothorax. No pleural
effusion.
IMPRESSION: No active cardiopulmonary disease.

## 2019-05-16 ENCOUNTER — Ambulatory Visit: Payer: BLUE CROSS/BLUE SHIELD | Admitting: Psychiatry

## 2019-05-21 ENCOUNTER — Ambulatory Visit: Payer: BLUE CROSS/BLUE SHIELD | Admitting: Psychiatry

## 2019-06-18 ENCOUNTER — Encounter: Payer: Self-pay | Admitting: Psychiatry

## 2019-06-18 ENCOUNTER — Ambulatory Visit: Payer: BC Managed Care – PPO | Admitting: Psychiatry

## 2019-06-18 ENCOUNTER — Other Ambulatory Visit: Payer: Self-pay

## 2019-06-18 DIAGNOSIS — F4310 Post-traumatic stress disorder, unspecified: Secondary | ICD-10-CM | POA: Diagnosis not present

## 2019-06-18 DIAGNOSIS — F411 Generalized anxiety disorder: Secondary | ICD-10-CM

## 2019-06-18 DIAGNOSIS — F4010 Social phobia, unspecified: Secondary | ICD-10-CM

## 2019-06-18 DIAGNOSIS — F4001 Agoraphobia with panic disorder: Secondary | ICD-10-CM | POA: Diagnosis not present

## 2019-06-18 MED ORDER — SERTRALINE HCL 100 MG PO TABS: ORAL_TABLET | ORAL | 3 refills | Status: DC

## 2019-06-18 MED ORDER — PROPRANOLOL HCL 10 MG PO TABS: ORAL_TABLET | ORAL | 2 refills | Status: DC

## 2019-06-18 NOTE — Progress Notes (Signed)
Amy [name] [id number] [birth year] 53 y.o.  Subjective:   Patient ID:  Amy [name] is a 53 y.o. (DOB [birth year]) female.  Chief Complaint:  Chief Complaint  Patient presents with  . Anxiety  . Depression  . Paranoid    HPI Amy [name] presents to the office today for follow-up of anxiety and mood disturbance. Sertraline was increased to 100 mg po qd at last visit on 04/09/19. She reports that she noticed some benefit with mood and anxiety s/s at higher dose of Sertraline. Reports that Propranolol has been effective at times.   She reports that she has not had her medication for several weeks. She reports that she has had frequent panic. Reports intrusive memories and frequent worry. Reports that she has been compulsively locking and checking doors repeatedly. Reports catastrophic thinking, such as thinking about getting COVID and being on ventilator.   She reports that that she feels like her thoughts are racing at times. Reports depressed mood. Reports irritability and then will feel tearful and regret her behavior while irritable. She reports that she slept 3 hours last night and this is consistent with sleep pattern recently. Reports having a few nightmares. She reports concentration and memory issues and at times cannot recall if she took her medication or not. She reports that her appetite has been ok. She reports low energy and motivation have been low. Denies SI.  Denies risky or impulsive behavior. Denies excessive spending.   She reports continued paranoia and denies any change with or without medication.   Reports that her husband was out of work due to allergic reaction s/s and after testing was determined to have alpha-gal allergy and also has a milk allergy.  Past Medication Trials: Abilify- Irritable, restless, nausea Vraylar- akathisia Latuda- Effective, then stopped working Lamotrigine  Prozac Trileptal Sertraline Propranolol- effective for akathisia. May  have been helpful for anxiety. Buspar- "did not like the way it made me feel"   Review of Systems:  Review of Systems  Musculoskeletal: Positive for myalgias. Negative for gait problem.       She reports increase in muscle aches when anxiety is higher.   Neurological: Negative for tremors.  Psychiatric/Behavioral:       Please refer to HPI   Reports that she has upcoming apt with cardiologist.   Medications: I have reviewed the patient's current medications.  Current Outpatient Medications  Medication Sig Dispense Refill  . ALPRAZolam (XANAX) 0.5 MG tablet Take 0.5 mg by mouth 4 (four) times daily as needed for anxiety.     . hydrochlorothiazide (HYDRODIURIL) 50 MG tablet Take 50 mg by mouth daily.     [name] loratadine (CLARITIN) 10 MG tablet Take 10 mg by mouth daily.    . metFORMIN (GLUCOPHAGE-XR) 500 MG 24 hr tablet Take 500 mg by mouth 2 (two) times daily.     . propranolol (INDERAL) 10 MG tablet Take 1-2 tabs po BID prn restlessness or anxiety 120 tablet 2  . fluconazole (DIFLUCAN) 150 MG tablet Take 1 now and repeat 1 in 3 days (Patient not taking: Reported on 12/05/2018) 2 tablet 1  . metoCLOPramide (REGLAN) 10 MG tablet Take 1 tablet (10 mg total) by mouth every 8 (eight) hours as needed for up to 10 days for nausea or vomiting. (Patient not taking: Reported on 01/10/2019) 15 tablet 0  . mometasone (ELOCON) 0.1 % ointment Apply topically daily. 15 g 1  . omeprazole (PRILOSEC OTC) 20 MG tablet Take 20 mg by  mouth daily as needed.     . sertraline (ZOLOFT) 100 MG tablet Take 1/2 tablet daily for one week, then increase to 1 tablet daily for one week, then increase to 1.5 tablets po qd 45 tablet 3  . sucralfate (CARAFATE) 1 g tablet Take 1 tablet (1 g total) by mouth 4 (four) times daily as needed for up to 15 days. (Patient not taking: Reported on 01/10/2019) 30 tablet 1   No current facility-administered medications for this visit.     Medication Side Effects: None  Allergies:   Allergies  Allergen Reactions  . Hydromorphone Nausea And Vomiting  . Ciprofloxacin Rash    Past Medical History:  Diagnosis Date  . Anemia   . Anxiety    xanax prn  . Blood transfusion without reported diagnosis   . Degenerative disc disease   . Diabetes mellitus without complication ([place])   . Fatty liver   . Fibromyalgia   . GERD (gastroesophageal reflux disease)    prilosec otc-instructed to take dos  . Hot flashes 02/19/2014  . Hypertension    controlled on hctz-bp at pat 145/94  . Neuromuscular disorder ([place])    firbrmylagia  . Obesity   . PONV (postoperative nausea and vomiting)    pt states has not had ponv but has been treated in past with scopolamine, does admit to history of motion sickness, history of vertigo  . Vertigo    recent diagnosis of vertigo-uses otc meclizine  . Weight gain 02/19/2014    Family History  Problem Relation Age of Onset  . Cancer Paternal Uncle        liver   . Cancer Maternal Grandmother        throat  . Arthritis Mother   . Anxiety disorder Mother   . Depression Mother   . Diabetes Father   . Hypertension Father   . Arthritis Sister   . Diabetes Maternal Grandfather   . Hypertension Maternal Grandfather   . Diabetes Paternal Grandmother   . Hypertension Paternal Grandmother   . Anxiety disorder Paternal Grandmother   . Diabetes Paternal Grandfather   . Hypertension Paternal Grandfather   . Mood Disorder Brother   . Arthritis Sister   . Panic disorder Sister   . Psychosis Maternal Uncle   . Mood Disorder Maternal Uncle   . Anxiety disorder Paternal Uncle     Social History   Socioeconomic History  . Marital status: Married    Spouse name: Not on file  . Number of children: Not on file  . Years of education: Not on file  . Highest education level: Not on file  Occupational History  . Not on file  Social Needs  . Financial resource strain: Not on file  . Food insecurity    Worry: Not on file    Inability: Not on  file  . Transportation needs    Medical: Not on file    Non-medical: Not on file  Tobacco Use  . Smoking status: Never Smoker  . Smokeless tobacco: Never Used  Substance and Sexual Activity  . Alcohol use: No  . Drug use: No  . Sexual activity: Yes    Partners: Male    Birth control/protection: Surgical    Comment: married  Lifestyle  . Physical activity    Days per week: Not on file    Minutes per session: Not on file  . Stress: Not on file  Relationships  . Social Herbalist on  phone: Not on file    Gets together: Not on file    Attends religious service: Not on file    Active member of club or organization: Not on file    Attends meetings of clubs or organizations: Not on file    Relationship status: Not on file  . Intimate partner violence    Fear of current or ex partner: Not on file    Emotionally abused: Not on file    Physically abused: Not on file    Forced sexual activity: Not on file  Other Topics Concern  . Not on file  Social History Narrative  . Not on file    Past Medical History, Surgical history, Social history, and Family history were reviewed and updated as appropriate.   Please see review of systems for further details on the patient's review from today.   Objective:   Physical Exam:  Pulse 87   LMP 05/01/2011   Physical Exam Constitutional:      General: She is not in acute distress.    Appearance: She is well-developed.  Musculoskeletal:        General: No deformity.  Neurological:     Mental Status: She is alert and oriented to person, place, and time.     Coordination: Coordination normal.  Psychiatric:        Attention and Perception: Attention and perception normal. She does not perceive auditory or visual hallucinations.        Mood and Affect: Mood is anxious and depressed. Affect is blunt. Affect is not labile, angry or inappropriate.        Speech: Speech normal.        Behavior: Behavior normal.        Thought  Content: Thought content is paranoid. Thought content does not include homicidal or suicidal ideation. Thought content does not include homicidal or suicidal plan.        Cognition and Memory: Cognition and memory normal.        Judgment: Judgment normal.     Comments: Insight fair      Lab Review:     Component Value Date/Time   NA 136 01/04/2019 1403   K 3.2 (L) 01/04/2019 1403   CL 99 01/04/2019 1403   CO2 28 01/04/2019 1403   GLUCOSE 115 (H) 01/04/2019 1403   BUN 10 01/04/2019 1403   CREATININE 0.69 01/04/2019 1403   CREATININE 0.69 02/19/2014 1542   CALCIUM 9.3 01/04/2019 1403   PROT 7.8 01/04/2019 1403   ALBUMIN 4.6 01/04/2019 1403   AST 44 (H) 01/04/2019 1403   ALT 54 (H) 01/04/2019 1403   ALKPHOS 91 01/04/2019 1403   BILITOT 1.0 01/04/2019 1403   GFRNONAA >60 01/04/2019 1403   GFRAA >60 01/04/2019 1403       Component Value Date/Time   WBC 10.4 01/04/2019 1403   RBC 5.67 (H) 01/04/2019 1403   HGB 16.1 (H) 01/04/2019 1403   HCT 47.5 (H) 01/04/2019 1403   PLT 297 01/04/2019 1403   MCV 83.8 01/04/2019 1403   MCH 28.4 01/04/2019 1403   MCHC 33.9 01/04/2019 1403   RDW 13.7 01/04/2019 1403   LYMPHSABS 2.1 07/12/2016 2349   MONOABS 0.6 07/12/2016 2349   EOSABS 0.1 07/12/2016 2349   BASOSABS 0.0 07/12/2016 2349    No results found for: POCLITH, LITHIUM   No results found for: PHENYTOIN, PHENOBARB, VALPROATE, CBMZ   .res Assessment: Plan:   Will resume sertraline since this has been effective for  patient's mood and anxiety signs and symptoms.  Discussed starting with low-dose to minimize risk of side effects and titrating to higher dose of 150 mg daily since patient reports having a partial response at 100 mg daily in the past.  Discussed that higher doses may be more effective to fully control anxiety signs and symptoms, such as obsessive thoughts, social anxiety, and panic.  Advised patient to contact office if she has any worsening signs and symptoms to include  possible mania or sleep disturbance. Discussed with patient that in the future if she is unable to follow-up as scheduled she may contact office for medication refills.  Recommended that she contact office when issues arise, such as financial difficulties that interfere with closer follow-up and that would eliminate lapse in treatment. Encourage patient to continue psychotherapy. Discussed decreasing frequency of follow-up due to patient's financial constraints and patient reports that she would prefer to follow-up every 3 months and then contact office or follow-up sooner if needed. Patient advised to contact office with any questions, adverse effects, or acute worsening in signs and symptoms.   Amy [name] was seen today for anxiety, depression and paranoid.  Diagnoses and all orders for this visit:  PTSD (post-traumatic stress disorder) -     sertraline (ZOLOFT) 100 MG tablet; Take 1/2 tablet daily for one week, then increase to 1 tablet daily for one week, then increase to 1.5 tablets po qd  Panic disorder with agoraphobia -     sertraline (ZOLOFT) 100 MG tablet; Take 1/2 tablet daily for one week, then increase to 1 tablet daily for one week, then increase to 1.5 tablets po qd  Generalized anxiety disorder -     sertraline (ZOLOFT) 100 MG tablet; Take 1/2 tablet daily for one week, then increase to 1 tablet daily for one week, then increase to 1.5 tablets po qd  Social anxiety disorder -     sertraline (ZOLOFT) 100 MG tablet; Take 1/2 tablet daily for one week, then increase to 1 tablet daily for one week, then increase to 1.5 tablets po qd -     propranolol (INDERAL) 10 MG tablet; Take 1-2 tabs po BID prn restlessness or anxiety     Please see After Visit Summary for patient specific instructions.  Future Appointments  Date Time Provider [place]  06/27/2019  8:30 AM Croitoru, Dani Gobble, MD [facility]  09/18/2019 11:15 AM Thayer Headings, PMHNP [facility] None    No orders  of the defined types were placed in this encounter.   -------------------------------

## 2019-06-19 ENCOUNTER — Ambulatory Visit: Payer: BC Managed Care – PPO | Admitting: Psychiatry

## 2019-06-26 ENCOUNTER — Telehealth: Payer: Self-pay | Admitting: Cardiovascular Disease

## 2019-06-26 NOTE — Telephone Encounter (Signed)
Spoke with pt and advised that her appointment was scheduled on Dr. Lurline Del virtual clinic day. Pt report that she would prefer to be seen in office. Advised that first available is not until October. Pt voiced understanding. Appointment scheduled for 10/22 at 2 pm.

## 2019-06-26 NOTE — Telephone Encounter (Signed)
Spoke with pt who report she has real bad anxiety and requesting if her husband can attend her appointment that scheduled for tomorrow 7/24 with Dr. Sallyanne Kuster. Informed pt that Nurse will route message to MD and Nurse for approval. Pt voiced understanding.

## 2019-06-26 NOTE — Telephone Encounter (Signed)
  Patient is asking that her husband come to her appt with her. She states that she has anxiety when going to doctor appts and it makes her feel better to have her husband there. She is already having anxiety about her appt. Please call to let her know if she can come. Her appt is 06/27/19 @ 8:30am.

## 2019-06-26 NOTE — Telephone Encounter (Signed)
She is scheduled for a virtual visit by smart phone/computer. Obviously her husband can participate. Please make sure she understands that she should NOT come to the office. I will not be there. If she needs an in-person visit, will need to be rescheduled.

## 2019-06-27 ENCOUNTER — Ambulatory Visit: Payer: BC Managed Care – PPO | Admitting: Cardiovascular Disease

## 2019-07-21 ENCOUNTER — Other Ambulatory Visit: Payer: Self-pay

## 2019-07-21 ENCOUNTER — Emergency Department (HOSPITAL_COMMUNITY): Payer: BC Managed Care – PPO

## 2019-07-21 ENCOUNTER — Emergency Department (HOSPITAL_COMMUNITY)
Admission: EM | Admit: 2019-07-21 | Discharge: 2019-07-21 | Disposition: A | Discharge status: Home / Self Care | Payer: BC Managed Care – PPO | Attending: Emergency Medicine | Admitting: Emergency Medicine

## 2019-07-21 ENCOUNTER — Encounter (HOSPITAL_COMMUNITY): Payer: Self-pay | Admitting: Emergency Medicine

## 2019-07-21 DIAGNOSIS — R7989 Other specified abnormal findings of blood chemistry: Secondary | ICD-10-CM | POA: Diagnosis not present

## 2019-07-21 DIAGNOSIS — Z79899 Other long term (current) drug therapy: Secondary | ICD-10-CM | POA: Diagnosis not present

## 2019-07-21 DIAGNOSIS — E876 Hypokalemia: Secondary | ICD-10-CM | POA: Diagnosis not present

## 2019-07-21 DIAGNOSIS — R072 Precordial pain: Secondary | ICD-10-CM | POA: Diagnosis not present

## 2019-07-21 DIAGNOSIS — R0600 Dyspnea, unspecified: Secondary | ICD-10-CM | POA: Diagnosis not present

## 2019-07-21 DIAGNOSIS — I10 Essential (primary) hypertension: Secondary | ICD-10-CM | POA: Diagnosis not present

## 2019-07-21 DIAGNOSIS — R079 Chest pain, unspecified: Secondary | ICD-10-CM

## 2019-07-21 DIAGNOSIS — E119 Type 2 diabetes mellitus without complications: Secondary | ICD-10-CM | POA: Diagnosis not present

## 2019-07-21 DIAGNOSIS — R0789 Other chest pain: Secondary | ICD-10-CM | POA: Diagnosis not present

## 2019-07-21 DIAGNOSIS — R0602 Shortness of breath: Secondary | ICD-10-CM | POA: Diagnosis not present

## 2019-07-21 DIAGNOSIS — Z7984 Long term (current) use of oral hypoglycemic drugs: Secondary | ICD-10-CM | POA: Insufficient documentation

## 2019-07-21 LAB — TROPONIN I (HIGH SENSITIVITY)
Troponin I (High Sensitivity): 3 ng/L (ref <18)
Troponin I (High Sensitivity): 2 ng/L (ref <18)

## 2019-07-21 LAB — BASIC METABOLIC PANEL
Anion gap: 11 (ref 5–15)
BUN: 8 mg/dL (ref 6–20)
CO2: 30 mmol/L (ref 22–32)
Calcium: 9.2 mg/dL (ref 8.9–10.3)
Chloride: 95 mmol/L — ABNORMAL LOW (ref 98–111)
Creatinine, Ser: 0.61 mg/dL (ref 0.44–1.00)
GFR calc Af Amer: >60 mL/min (ref >60)
GFR calc non Af Amer: >60 mL/min (ref >60)
Glucose, Bld: 146 mg/dL — ABNORMAL HIGH (ref 70–99)
Potassium: 2.7 mmol/L — CL (ref 3.5–5.1)
Sodium: 136 mmol/L (ref 135–145)

## 2019-07-21 LAB — HEPATIC FUNCTION PANEL
ALT: 46 U/L — ABNORMAL HIGH (ref 0–44)
AST: 37 U/L (ref 15–41)
Albumin: 4 g/dL (ref 3.5–5.0)
Alkaline Phosphatase: 92 U/L (ref 38–126)
Bilirubin, Direct: 0.1 mg/dL (ref 0.0–0.2)
Indirect Bilirubin: 0.6 mg/dL (ref 0.3–0.9)
Total Bilirubin: 0.7 mg/dL (ref 0.3–1.2)
Total Protein: 7.5 g/dL (ref 6.5–8.1)

## 2019-07-21 LAB — URINALYSIS, ROUTINE W REFLEX MICROSCOPIC
Bilirubin Urine: NEGATIVE
Glucose, UA: NEGATIVE mg/dL
Hgb urine dipstick: NEGATIVE
Ketones, ur: NEGATIVE mg/dL
Leukocytes,Ua: NEGATIVE
Nitrite: NEGATIVE
Protein, ur: NEGATIVE mg/dL
Specific Gravity, Urine: 1.01 (ref 1.005–1.030)
pH: 7 (ref 5.0–8.0)

## 2019-07-21 LAB — CBC
HCT: 48.6 % — ABNORMAL HIGH (ref 36.0–46.0)
Hemoglobin: 16.1 g/dL — ABNORMAL HIGH (ref 12.0–15.0)
MCH: 28.6 pg (ref 26.0–34.0)
MCHC: 33.1 g/dL (ref 30.0–36.0)
MCV: 86.5 fL (ref 80.0–100.0)
Platelets: 302 10*3/uL (ref 150–400)
RBC: 5.62 MIL/uL — ABNORMAL HIGH (ref 3.87–5.11)
RDW: 13.6 % (ref 11.5–15.5)
WBC: 10.8 10*3/uL — ABNORMAL HIGH (ref 4.0–10.5)
nRBC: 0 % (ref 0.0–0.2)

## 2019-07-21 LAB — D-DIMER, QUANTITATIVE: D-Dimer, Quant: 1.05 ug/mL-FEU — ABNORMAL HIGH (ref 0.00–0.50)

## 2019-07-21 LAB — POC URINE PREG, ED: Preg Test, Ur: NEGATIVE

## 2019-07-21 LAB — LIPASE, BLOOD: Lipase: 29 U/L (ref 11–51)

## 2019-07-21 LAB — TSH: TSH: 2.876 u[IU]/mL (ref 0.350–4.500)

## 2019-07-21 MED ORDER — LORAZEPAM 2 MG/ML IJ SOLN
1.0000 mg | Freq: Once | INTRAMUSCULAR | Status: AC
  Administered 2019-07-21: 17:00:00 1 mg via INTRAVENOUS
  Filled 2019-07-21: qty 1

## 2019-07-21 MED ORDER — POTASSIUM CHLORIDE 10 MEQ/100ML IV SOLN
10.0000 meq | Freq: Once | INTRAVENOUS | Status: AC
  Administered 2019-07-21: 10 meq via INTRAVENOUS
  Filled 2019-07-21: qty 100

## 2019-07-21 MED ORDER — IOHEXOL 350 MG/ML SOLN
100.0000 mL | Freq: Once | INTRAVENOUS | Status: AC | PRN
  Administered 2019-07-21: 100 mL via INTRAVENOUS

## 2019-07-21 MED ORDER — POTASSIUM CHLORIDE ER 10 MEQ PO TBCR: 10.0000 meq | EXTENDED_RELEASE_TABLET | Freq: Every day | ORAL | 0 refills | Status: DC

## 2019-07-21 NOTE — ED Notes (Signed)
CRITICAL VALUE ALERT  Critical Value:  Potassium 2.7  Date & Time Notied:  07/21/2019 1707  Provider Notified: Margaux, PA  Orders Received/Actions taken: None  yet

## 2019-07-21 NOTE — ED Provider Notes (Signed)
[facility] EMERGENCY DEPARTMENT Provider Note   CSN: [id number] Arrival date & time: 07/21/19  1418    History   Chief Complaint Chief Complaint  Patient presents with  . Chest Pain  . Arm Pain    HPI Amy [name] is a 53 y.o. female past medical history anxiety, diabetes, GERD, hypertension presents to the ED today complaining of gradual onset, intermittent, burning, substernal chest pain x 1 week.  Patient also complains of dyspnea on exertion.  She reports that she was having these symptoms prior to going to the [employer] this past weekend.  She states history of GERD and is currently on Pepcid.  States this feels slightly similar although different.  Is not having any burning sensation in her throat.  The pain goes away while she is eating but comes back after eating and is more severe.  Patient reports that she has been scheduled to have an EGD done 3 times but has canceled.  She states she has terrible anxiety and is worried that something may happen while she is under.  Patient currently takes Xanax for her anxiety.  She is supposed to be on Zoloft but reports she has not really been taking it as prescribed.  Denies fever, chills, cough, nausea, vomiting, diarrhea, constipation, any other associated symptoms.  No history of MI.  No family history.  Is a never smoker.  No history of DVT/PE in the past.  Patient is not on any kind of oral contraception.  She had a hysterectomy in 2012.   HPI: A 53 year old patient with a history of treated diabetes, hypertension and obesity presents for evaluation of chest pain. Initial onset of pain was approximately 3-6 hours ago. The patient's chest pain is not worse with exertion. The patient's chest pain is middle- or left-sided, is not well-localized, is not described as heaviness/pressure/tightness, is not sharp and does not radiate to the arms/jaw/neck. The patient does not complain of nausea and denies diaphoresis. The patient has no history of  stroke, has no history of peripheral artery disease, has not smoked in the past 90 days, has no relevant family history of coronary artery disease (first degree relative at less than age 70) and has no history of hypercholesterolemia.     Past Medical History:  Diagnosis Date  . Anemia   . Anxiety    xanax prn  . Blood transfusion without reported diagnosis   . Degenerative disc disease   . Diabetes mellitus without complication ([place])   . Fatty liver   . Fibromyalgia   . GERD (gastroesophageal reflux disease)    prilosec otc-instructed to take dos  . Hot flashes 02/19/2014  . Hypertension    controlled on hctz-bp at pat 145/94  . Neuromuscular disorder ([place])    firbrmylagia  . Obesity   . PONV (postoperative nausea and vomiting)    pt states has not had ponv but has been treated in past with scopolamine, does admit to history of motion sickness, history of vertigo  . Vertigo    recent diagnosis of vertigo-uses otc meclizine  . Weight gain 02/19/2014    Patient Active Problem List   Diagnosis Date Noted  . Insomnia 11/20/2018  . Abscess of vagina 11/16/2018  . Social phobia 08/24/2018  . Bipolar II disorder ([place]) 08/24/2018  . Panic disorder 08/24/2018  . Night sweats 03/19/2017  . Screening for colorectal cancer 03/19/2017  . Rectocele 03/19/2017  . Rectal irritation 03/19/2017  . Hot flashes 02/19/2014  .  Weight gain 02/19/2014  . Internal hemorrhoid 02/19/2014    Past Surgical History:  Procedure Laterality Date  . ABDOMINAL HYSTERECTOMY    . BREAST REDUCTION SURGERY     2010  . ECTOPIC PREGNANCY SURGERY     2002  . LAPAROSCOPIC ASSISTED VAGINAL HYSTERECTOMY  06/14/2011   Procedure: LAPAROSCOPIC ASSISTED VAGINAL HYSTERECTOMY;  Surgeon: Luz Lex, MD;  Location: [facility];  Service: Gynecology;  Laterality: N/A;  Abdomen and vagina  . LAPAROSCOPY     2006-for adhesions     OB History    Gravida  2   Para      Term      Preterm      AB  2   Living  0      SAB  1   TAB      Ectopic  1   Multiple      Live Births               Home Medications    Prior to Admission medications   Medication Sig Start Date End Date Taking? Authorizing Provider  ALPRAZolam Duanne Moron) 0.5 MG tablet Take 0.5 mg by mouth 4 (four) times daily as needed for anxiety.    Yes [provider]  famotidine (PEPCID) 20 MG tablet Take 20 mg by mouth 2 (two) times daily.   Yes [provider]  hydrochlorothiazide (HYDRODIURIL) 50 MG tablet Take 50 mg by mouth daily.  08/05/13  Yes [provider]  loratadine (CLARITIN) 10 MG tablet Take 10 mg by mouth daily.   Yes [provider]  metFORMIN (GLUCOPHAGE-XR) 500 MG 24 hr tablet Take 500 mg by mouth daily with breakfast. *May take additional tablet in the evening for high blood glucose levels 07/05/18  Yes [provider]  potassium chloride (K-DUR) 10 MEQ tablet Take 1 tablet (10 mEq total) by mouth daily for 4 days. 07/21/19 07/25/19  Eustaquio Maize, PA-C    Family History Family History  Problem Relation Age of Onset  . Cancer Paternal Uncle        liver   . Cancer Maternal Grandmother        throat  . Arthritis Mother   . Anxiety disorder Mother   . Depression Mother   . Diabetes Father   . Hypertension Father   . Arthritis Sister   . Diabetes Maternal Grandfather   . Hypertension Maternal Grandfather   . Diabetes Paternal Grandmother   . Hypertension Paternal Grandmother   . Anxiety disorder Paternal Grandmother   . Diabetes Paternal Grandfather   . Hypertension Paternal Grandfather   . Mood Disorder Brother   . Arthritis Sister   . Panic disorder Sister   . Psychosis Maternal Uncle   . Mood Disorder Maternal Uncle   . Anxiety disorder Paternal Uncle     Social History Social History   Tobacco Use  . Smoking status: Never Smoker  . Smokeless tobacco: Never Used  Substance Use Topics  . Alcohol use: No  . Drug use: No     Allergies    Hydromorphone and Ciprofloxacin   Review of Systems Review of Systems  Constitutional: Negative for chills and fever.  HENT: Negative for congestion.   Eyes: Negative for visual disturbance.  Respiratory: Positive for shortness of breath. Negative for cough.   Cardiovascular: Positive for chest pain. Negative for palpitations and leg swelling.  Gastrointestinal: Negative for abdominal pain, constipation, diarrhea, nausea and vomiting.  Genitourinary: Negative for difficulty urinating.  Musculoskeletal: Negative for arthralgias.  Skin: Negative for rash.  Neurological: Negative for headaches.  Psychiatric/Behavioral: The patient is nervous/anxious.      Physical Exam Updated Vital Signs BP (!) 152/120 (BP Location: Left Arm)   Pulse (!) 114 Comment: crying in triage  Temp 98 F (36.7 C) (Oral)   Resp 20   LMP 05/01/2011   SpO2 98%   Physical Exam Vitals signs and nursing note reviewed.  Constitutional:      Appearance: She is obese. She is not ill-appearing.     Comments: Patient tearful on exam.  States she has anxiety.  HENT:     Head: Normocephalic and atraumatic.  Eyes:     Conjunctiva/sclera: Conjunctivae normal.  Neck:     Musculoskeletal: Neck supple.  Cardiovascular:     Rate and Rhythm: Regular rhythm. Tachycardia present.     Pulses:          Radial pulses are 2+ on the right side and 2+ on the left side.       Dorsalis pedis pulses are 2+ on the right side and 2+ on the left side.     Heart sounds: Normal heart sounds.  Pulmonary:     Effort: Pulmonary effort is normal.     Breath sounds: Normal breath sounds. No decreased breath sounds, wheezing, rhonchi or rales.  Chest:     Chest wall: No tenderness.  Abdominal:     Palpations: Abdomen is soft.     Tenderness: There is no abdominal tenderness.  Musculoskeletal:     Right lower leg: No edema.     Left lower leg: No edema.  Skin:    General: Skin is warm and dry.  Neurological:     Mental  Status: She is alert.      ED Treatments / Results  Labs (all labs ordered are listed, but only abnormal results are displayed) Labs Reviewed  BASIC METABOLIC PANEL - Abnormal; Notable for the following components:      Result Value   Potassium 2.7 (*)    Chloride 95 (*)    Glucose, Bld 146 (*)    All other components within normal limits  CBC - Abnormal; Notable for the following components:   WBC 10.8 (*)    RBC 5.62 (*)    Hemoglobin 16.1 (*)    HCT 48.6 (*)    All other components within normal limits  D-DIMER, QUANTITATIVE (NOT AT [facility]) - Abnormal; Notable for the following components:   D-Dimer, Quant 1.05 (*)    All other components within normal limits  HEPATIC FUNCTION PANEL - Abnormal; Notable for the following components:   ALT 46 (*)    All other components within normal limits  URINALYSIS, ROUTINE W REFLEX MICROSCOPIC  LIPASE, BLOOD  TSH  POC URINE PREG, ED  TROPONIN I (HIGH SENSITIVITY)  TROPONIN I (HIGH SENSITIVITY)    EKG EKG Interpretation  Date/Time:  Monday July 21 2019 14:31:57 EDT Ventricular Rate:  103 PR Interval:  134 QRS Duration: 98 QT Interval:  366 QTC Calculation: 479 R Axis:   -25 Text Interpretation:  Sinus tachycardia Possible Anterolateral infarct , age undetermined Abnormal ECG Confirmed by Noemi Chapel [phone]) on 07/21/2019 2:39:29 PM   Radiology Ct Angio Chest Pe W/cm &/or Wo Cm  Result Date: 07/21/2019 CLINICAL DATA:  Heartburn type chest pain for the past 3 days. Right arm pain. Elevated D-dimer. EXAM: CT ANGIOGRAPHY CHEST WITH CONTRAST TECHNIQUE: Multidetector CT imaging of the chest was performed  using the standard protocol during bolus administration of intravenous contrast. Multiplanar CT image reconstructions and MIPs were obtained to evaluate the vascular anatomy. CONTRAST:  128m OMNIPAQUE IOHEXOL 350 MG/ML SOLN COMPARISON:  01/04/2019. FINDINGS: Cardiovascular: Satisfactory opacification of the pulmonary arteries to  the segmental level. No evidence of pulmonary embolism. Normal heart size. No pericardial effusion. Mediastinum/Nodes: No enlarged mediastinal, hilar, or axillary lymph nodes. Thyroid gland, trachea, and esophagus demonstrate no significant findings. Lungs/Pleura: Lungs are clear. No pleural effusion or pneumothorax. Upper Abdomen: Unremarkable. Musculoskeletal: Thoracic spine degenerative changes. Sternomanubrial and bilateral sternoclavicular joint degenerative changes. Review of the MIP images confirms the above findings. IMPRESSION: No pulmonary emboli or acute abnormality. Electronically Signed   By: SClaudie ReveringM.D.   On: 07/21/2019 17:41   Dg Chest Portable 1 View  Result Date: 07/21/2019 CLINICAL DATA:  Chest pain EXAM: PORTABLE CHEST 1 VIEW COMPARISON:  01/04/2019 FINDINGS: Heart and mediastinal contours are within normal limits. No focal opacities or effusions. No acute bony abnormality. IMPRESSION: No active disease. Electronically Signed   By: KRolm BaptiseM.D.   On: 07/21/2019 15:44    Procedures Procedures (including critical care time)  Medications Ordered in ED Medications  LORazepam (ATIVAN) injection 1 mg (1 mg Intravenous Given 07/21/19 1654)  potassium chloride 10 mEq in 100 mL IVPB (0 mEq Intravenous Stopped 07/21/19 1832)  iohexol (OMNIPAQUE) 350 MG/ML injection 100 mL (100 mLs Intravenous Contrast Given 07/21/19 1719)     Initial Impression / Assessment and Plan / ED Course  I have reviewed the triage vital signs and the nursing notes.  Pertinent labs & imaging results that were available during my care of the patient were reviewed by me and considered in my medical decision making (see chart for details).  Clinical Course as of Jul 20 1853  [facility]  1607 D-Dimer, Quant(!): 1.05 [MV]  1710 Potassium(!!): 2.7 [MV]  1747 Negative for PE  CT Angio Chest PE W/Cm &/Or Wo Cm [MV]    Clinical Course User Index [MV] VEustaquio Maize PA-C   53year old female who  presents today complaining of intermittent chest burning sensation and dyspnea on exertion for the past week.  [place] the pain was present before then.  No history of DVT/PE in the past.  Patient is not on any kind of oral contraception.  On arrival to the ED patient is tachycardic.  She continues to be in the high 90s/low 100s on exam.  It appears patient has not been taking her Zoloft as prescribed.  Suspect this may be causing patient's symptoms.  Patient also has a history of GERD and has been scheduled for 3 EGDs in the past but has canceled all of them due to her anxiety.  [facility] given tachycardia.  Will obtain d-dimer today.  Symptoms do not sound like ACS but will obtain troponin to rule out.  EKG without any kind of ischemic changes.  Patient without any kind of infectious symptoms today.  She is afebrile and nontachypneic. Will reevaluate once labs and imaging return. Also complaining of some right arm pain.  She is seeing Dr. OApolonio Schneidersin the morning.  We will have him evaluate.  Chest x-ray negative.  Initial troponin III.  Will repeat.  Mild elevation in leukocytosis at 10.8.  Suspect this is due to pain.  Patient does appear hemoconcentrated as well given elevation in her white blood cells and hemoglobin and hematocrit.  Although normal specific gravity and normal creatinine level.  Patient does not currently want IV fluids.  D-dimer elevated at 1.05.  Will get CTA at this time.  Requesting something for anxiety prior to CT scan.  Will give 1 mg Ativan.  Nursing staff informed that potassium 2.7.  Will replete in the ED today.  It appears patient has been chronically low.  She is on HCTZ.  We will send her home with some as well.  She will need to follow-up with her primary care doctor and decide if she needs to be on this long-term.  Repeat troponin II.  TSH normal.  Remainder of labs within normal limits as well.  CTA negative at this  time.    Heart score 4.  Feel patient can be discharged at this time with primary care follow-up.  Suspect her chest pain and dyspnea is more related to her anxiety than anything else.  Patient has not been on her Zoloft as prescribed.  Advised that she begin this immediately when she gets home.  Patient is in agreement with plan at this time and stable for discharge home. HEAR Score: 4    Final Clinical Impressions(s) / ED Diagnoses   Final diagnoses:  Chest pain, unspecified type  Hypokalemia    ED Discharge Orders         Ordered    potassium chloride (K-DUR) 10 MEQ tablet  Daily     07/21/19 1853           Eustaquio Maize, PA-C 07/21/19 2110    Daleen Bo, MD 07/22/19 1230

## 2019-07-21 NOTE — ED Triage Notes (Signed)
Pt states that her right arm have been hurting for days. She denies injury she states that her pinky when numb a couple days ago. She states that she has heartburn that it will not go away it has been going on for several days. She has taken otc medications without relief.

## 2019-07-21 NOTE — Discharge Instructions (Signed)
You are seen in the ED today for chest pain as well as shortness of breath.  Your chest x-ray did not show any signs of infection.  Did obtain a CT scan of your chest which did not show any blood clots.  You were found to have a low potassium and 2.7.  We have given you potassium in the emergency department and I have sent you home with a short course.  I suspect this is due to your fluid pill.  Please take potassium as prescribed and follow-up with your primary care doctor in the morning.  Return to the ED for any worsening symptoms including worsening chest pain, shortness of breath, vomiting.

## 2019-07-21 NOTE — ED Notes (Signed)
Pt reports she has had about a 40 lb weight gain in the past 6 months. She also stopped taking all of her medications 2 weeks ago and really does not know why. Was reluctant to discuss this in front of her husband.

## 2019-07-22 DIAGNOSIS — M25521 Pain in right elbow: Secondary | ICD-10-CM | POA: Diagnosis not present

## 2019-07-22 DIAGNOSIS — R52 Pain, unspecified: Secondary | ICD-10-CM | POA: Diagnosis not present

## 2019-07-30 DIAGNOSIS — Z681 Body mass index (BMI) 19 or less, adult: Secondary | ICD-10-CM | POA: Diagnosis not present

## 2019-07-30 DIAGNOSIS — I10 Essential (primary) hypertension: Secondary | ICD-10-CM | POA: Diagnosis not present

## 2019-07-30 DIAGNOSIS — Z13220 Encounter for screening for lipoid disorders: Secondary | ICD-10-CM | POA: Diagnosis not present

## 2019-07-30 DIAGNOSIS — E119 Type 2 diabetes mellitus without complications: Secondary | ICD-10-CM | POA: Diagnosis not present

## 2019-07-30 DIAGNOSIS — R945 Abnormal results of liver function studies: Secondary | ICD-10-CM | POA: Diagnosis not present

## 2019-07-30 DIAGNOSIS — E1165 Type 2 diabetes mellitus with hyperglycemia: Secondary | ICD-10-CM | POA: Diagnosis not present

## 2019-07-30 DIAGNOSIS — E669 Obesity, unspecified: Secondary | ICD-10-CM | POA: Diagnosis not present

## 2019-08-04 ENCOUNTER — Other Ambulatory Visit: Payer: Self-pay | Admitting: Internal Medicine

## 2019-08-04 ENCOUNTER — Other Ambulatory Visit (HOSPITAL_COMMUNITY): Payer: Self-pay | Admitting: Internal Medicine

## 2019-08-04 DIAGNOSIS — R945 Abnormal results of liver function studies: Secondary | ICD-10-CM

## 2019-08-07 ENCOUNTER — Ambulatory Visit (HOSPITAL_COMMUNITY)
Admission: RE | Admit: 2019-08-07 | Discharge: 2019-08-07 | Disposition: A | Discharge status: Home / Self Care | Payer: BC Managed Care – PPO | Source: Ambulatory Visit | Attending: Internal Medicine | Admitting: Internal Medicine

## 2019-08-07 ENCOUNTER — Other Ambulatory Visit: Payer: Self-pay

## 2019-08-07 DIAGNOSIS — R945 Abnormal results of liver function studies: Secondary | ICD-10-CM | POA: Insufficient documentation

## 2019-08-07 DIAGNOSIS — K760 Fatty (change of) liver, not elsewhere classified: Secondary | ICD-10-CM | POA: Diagnosis not present

## 2019-08-14 DIAGNOSIS — R748 Abnormal levels of other serum enzymes: Secondary | ICD-10-CM | POA: Diagnosis not present

## 2019-08-14 DIAGNOSIS — K7581 Nonalcoholic steatohepatitis (NASH): Secondary | ICD-10-CM | POA: Diagnosis not present

## 2019-08-14 DIAGNOSIS — Z1211 Encounter for screening for malignant neoplasm of colon: Secondary | ICD-10-CM | POA: Diagnosis not present

## 2019-08-25 DIAGNOSIS — G5601 Carpal tunnel syndrome, right upper limb: Secondary | ICD-10-CM | POA: Diagnosis not present

## 2019-08-25 DIAGNOSIS — G5621 Lesion of ulnar nerve, right upper limb: Secondary | ICD-10-CM | POA: Diagnosis not present

## 2019-08-29 DIAGNOSIS — M25521 Pain in right elbow: Secondary | ICD-10-CM | POA: Diagnosis not present

## 2019-09-08 DIAGNOSIS — M25521 Pain in right elbow: Secondary | ICD-10-CM | POA: Diagnosis not present

## 2019-09-10 DIAGNOSIS — R2231 Localized swelling, mass and lump, right upper limb: Secondary | ICD-10-CM | POA: Diagnosis not present

## 2019-09-10 DIAGNOSIS — M79641 Pain in right hand: Secondary | ICD-10-CM | POA: Diagnosis not present

## 2019-09-11 DIAGNOSIS — M25521 Pain in right elbow: Secondary | ICD-10-CM | POA: Diagnosis not present

## 2019-09-18 ENCOUNTER — Ambulatory Visit: Payer: BC Managed Care – PPO | Admitting: Psychiatry

## 2019-09-19 ENCOUNTER — Ambulatory Visit: Payer: BC Managed Care – PPO | Admitting: Psychiatry

## 2019-09-22 ENCOUNTER — Ambulatory Visit: Payer: BC Managed Care – PPO | Admitting: Psychiatry

## 2019-09-25 ENCOUNTER — Encounter

## 2019-09-25 ENCOUNTER — Ambulatory Visit: Payer: BC Managed Care – PPO | Admitting: Cardiovascular Disease

## 2019-09-25 ENCOUNTER — Other Ambulatory Visit: Payer: Self-pay

## 2019-09-25 VITALS — BP 128/81 | HR 82 | Ht 65.0 in | Wt 244.0 lb

## 2019-09-25 DIAGNOSIS — R072 Precordial pain: Secondary | ICD-10-CM

## 2019-09-25 DIAGNOSIS — E782 Mixed hyperlipidemia: Secondary | ICD-10-CM | POA: Diagnosis not present

## 2019-09-25 DIAGNOSIS — E1169 Type 2 diabetes mellitus with other specified complication: Secondary | ICD-10-CM | POA: Diagnosis not present

## 2019-09-25 DIAGNOSIS — E669 Obesity, unspecified: Secondary | ICD-10-CM

## 2019-09-25 DIAGNOSIS — K7581 Nonalcoholic steatohepatitis (NASH): Secondary | ICD-10-CM

## 2019-09-25 DIAGNOSIS — I10 Essential (primary) hypertension: Secondary | ICD-10-CM | POA: Diagnosis not present

## 2019-09-25 NOTE — Progress Notes (Signed)
Cardiology Office Note:    Date:  10/02/2019   ID:  Amy [name], DOB November 30, 1966, MRN [id number]  PCP:  Redmond School, MD  Cardiologist:  No primary care provider on file.  Electrophysiologist:  None   Referring MD: Redmond School, MD   Chief Complaint  Patient presents with   Hyperlipidemia    History of Present Illness:    Amy [name] is a 53 y.o. female with a hx of severe obesity, HTN, mixed hyperlipidemia, type 2 diabetes mellitus, recent diagnosis of fatty liver, gastroesophageal reflux disease, here to discuss therapy for her lipid disorder.  She had an episode of chest discomfort which led to an emergency room evaluation on August 17.  She had had chest discomfort continuously with intermittent exacerbation lasting for several days by the time she arrived to the ED.  Her work-up was benign including no evidence of ischemic changes on ECG and normal cardiac enzymes.  CT angiography was performed and did not show pulmonary embolism and was also significant for the complete absence of atherosclerotic calcification in her coronary arteries or thoracic aorta.  CXR was unrevealing and she has a normal gallbladder on right upper quadrant ultrasound.  Addition entertain the diagnosis of gastritis surprisingly her potassium was very low at 2.7.  Hydrochlorothiazide has been discontinued and she is now taking losartan.  On August 26 recheck potassium was improved at 3.8.[name]  She has been on hydrochlorothiazide since 2009 without adverse effects in the past.  She is aware of her abnormal lipids but has been reluctant to start treatment with statins because of the abnormal liver function tests.  Both her ALT and AST are less than twice the upper limit of normal, which has been the case since 2014 when she had an abdominal ultrasound that showed fatty liver.[name]  She was seen recently by Dr. Man who confirmed a diagnosis of nonalcoholic steatohepatitis and recommended a diet low in saturated  fat and weight loss.  The patient specifically denies any chest pain at rest exertion, dyspnea at rest or with exertion, orthopnea, paroxysmal nocturnal dyspnea, syncope, palpitations, focal neurological deficits, intermittent claudication, lower extremity edema, unexplained weight gain, cough, hemoptysis or wheezing.    Past Medical History:  Diagnosis Date   Anemia    Anxiety    xanax prn   Blood transfusion without reported diagnosis    Degenerative disc disease    Diabetes mellitus without complication ([place])    Fatty liver    Fibromyalgia    GERD (gastroesophageal reflux disease)    prilosec otc-instructed to take dos   Hot flashes 02/19/2014   Hypertension    controlled on hctz-bp at pat 145/94   Neuromuscular disorder ([place])    firbrmylagia   Obesity    PONV (postoperative nausea and vomiting)    pt states has not had ponv but has been treated in past with scopolamine, does admit to history of motion sickness, history of vertigo   Vertigo    recent diagnosis of vertigo-uses otc meclizine   Weight gain 02/19/2014    Past Surgical History:  Procedure Laterality Date   ABDOMINAL HYSTERECTOMY     BREAST REDUCTION SURGERY     2010   ECTOPIC PREGNANCY SURGERY     2002   LAPAROSCOPIC ASSISTED VAGINAL HYSTERECTOMY  06/14/2011   Procedure: LAPAROSCOPIC ASSISTED VAGINAL HYSTERECTOMY;  Surgeon: Luz Lex, MD;  Location: [facility];  Service: Gynecology;  Laterality: N/A;  Abdomen and vagina   LAPAROSCOPY  2006-for adhesions    Current Medications: Current Meds  Medication Sig   ALPRAZolam (XANAX) 0.5 MG tablet Take 0.5 mg by mouth 4 (four) times daily as needed for anxiety.    Cholecalciferol (D3-50 PO) Take by mouth.   famotidine (PEPCID) 20 MG tablet Take 20 mg by mouth 2 (two) times daily.   loratadine (CLARITIN) 10 MG tablet Take 10 mg by mouth daily.   losartan (COZAAR) 50 MG tablet Take 50 mg by mouth daily.   metFORMIN (GLUCOPHAGE-XR) 500  MG 24 hr tablet Take 500 mg by mouth daily with breakfast. *May take additional tablet in the evening for high blood glucose levels   [DISCONTINUED] hydrochlorothiazide (HYDRODIURIL) 50 MG tablet Take 50 mg by mouth daily.      Allergies:   Hydromorphone and Ciprofloxacin   Social History   Socioeconomic History   Marital status: Married    Spouse name: Not on file   Number of children: Not on file   Years of education: Not on file   Highest education level: Not on file  Occupational History   Not on file  Social Needs   Financial resource strain: Not on file   Food insecurity    Worry: Not on file    Inability: Not on file   Transportation needs    Medical: Not on file    Non-medical: Not on file  Tobacco Use   Smoking status: Never Smoker   Smokeless tobacco: Never Used  Substance and Sexual Activity   Alcohol use: No   Drug use: No   Sexual activity: Yes    Partners: Male    Birth control/protection: Surgical    Comment: married  Lifestyle   Physical activity    Days per week: Not on file    Minutes per session: Not on file   Stress: Not on file  Relationships   Social connections    Talks on phone: Not on file    Gets together: Not on file    Attends religious service: Not on file    Active member of club or organization: Not on file    Attends meetings of clubs or organizations: Not on file    Relationship status: Not on file  Other Topics Concern   Not on file  Social History Narrative   Not on file     Family History: The patient's family history includes Anxiety disorder in her mother, paternal grandmother, and paternal uncle; Arthritis in her mother, sister, and sister; Cancer in her maternal grandmother and paternal uncle; Depression in her mother; Diabetes in her father, maternal grandfather, paternal grandfather, and paternal grandmother; Hypertension in her father, maternal grandfather, paternal grandfather, and paternal  grandmother; Mood Disorder in her brother and maternal uncle; Panic disorder in her sister; Psychosis in her maternal uncle.  ROS:   Please see the history of present illness.     All other systems reviewed and are negative.  EKGs/Labs/Other Studies Reviewed:    The following studies were reviewed today: Extensive review of labs and radiological studies and ECG from emergency room visit. Follow-up labs from August 26  EKG:  EKG is ordered today.  The ekg ordered today demonstrates sinus rhythm, poor R wave progression likely related to obesity, no repolarization abnormalities, QTC 442 ms, unchanged from August 17 except for shorter QT after correction of hypokalemia.  Recent Labs: 07/21/2019: ALT 46; BUN 8; Creatinine, Ser 0.61; Hemoglobin 16.1; Platelets 302; Potassium 2.7; Sodium 136; TSH 2.876  Recent Lipid  Panel No results found for: CHOL, TRIG, HDL, CHOLHDL, VLDL, LDLCALC, LDLDIRECT July 30, 2019 total cholesterol 216, HDL 43, LDL 125, triglycerides 241 Hemoglobin A1c 6.6% Hemoglobin 15.6, creatinine 0.7, potassium 3.8, ALT 69 Physical Exam:    VS:  BP 128/81    Pulse 82    Ht 5' 5"  (1.651 m)    Wt 244 lb (110.7 kg)    LMP 05/01/2011    SpO2 97%    BMI 40.60 kg/m     Wt Readings from Last 3 Encounters:  09/25/19 244 lb (110.7 kg)  01/04/19 228 lb (103.4 kg)  12/05/18 231 lb 12.8 oz (105.1 kg)     GEN: Severely obese, well nourished, well developed in no acute distress HEENT: Normal NECK: No JVD; No carotid bruits LYMPHATICS: No lymphadenopathy CARDIAC: RRR, no murmurs, rubs, gallops RESPIRATORY:  Clear to auscultation without rales, wheezing or rhonchi  ABDOMEN: Soft, non-tender, non-distended MUSCULOSKELETAL:  No edema; No deformity  SKIN: Warm and dry NEUROLOGIC:  Alert and oriented x 3 PSYCHIATRIC:  Normal affect   ASSESSMENT:    1. Precordial pain   2. Mixed hyperlipidemia   3. NASH (nonalcoholic steatohepatitis)   4. Diabetes mellitus type 2 in obese ([place])    5. Essential hypertension    PLAN:    In order of problems listed above:  1. Chest pain: Does not have features suggestive of angina or other cardiovascular etiology.  Work-up has been negative. 2. HLP: Typical lipid profile suggestive of the metabolic syndrome/insulin resistance with low HDL, elevated triglycerides and likely adverse small dense LDL particle profile.  We reviewed the role of lipid-lowering therapy.  She remains concerned about her fatty liver.  All of these abnormalities would improve with substantial weight loss.  Discussed the concept of glycemic index and a low saturated fat, low carbohydrate diet, increased intake of lean protein and nonsaturated fat especially omega 3 fatty acids from nuts, oils, fish.  Exercise will be a critical part of any weight loss regimen if she is to improve all her metabolic risk factors. 3. NASH: No evidence of parenchymal liver insufficiency or structural changes other than fatty liver.  She does not drink alcohol.  Weight loss is the major way to deal with this. 4. DM: Adequate glycemic control. 5. HTN: Well-controlled, ARB good choice.   Medication Adjustments/Labs and Tests Ordered: Current medicines are reviewed at length with the patient today.  Concerns regarding medicines are outlined above.  Orders Placed This Encounter  Procedures   EKG 12-Lead   No orders of the defined types were placed in this encounter.   Patient Instructions  Medication Instructions:  No changes *If you need a refill on your cardiac medications before your next appointment, please call your pharmacy*  Lab Work: None ordered If you have labs (blood work) drawn today and your tests are completely normal, you will receive your results only by:  [place] (if you have MyChart) OR  A paper copy in the mail If you have any lab test that is abnormal or we need to change your treatment, we will call you to review the  results.  Testing/Procedures: None ordered  Follow-Up: At [facility], you and your health needs are our priority.  As part of our continuing mission to provide you with exceptional heart care, we have created designated Provider Care Teams.  These Care Teams include your primary Cardiologist (physician) and Advanced Practice Providers (APPs -  Physician Assistants and Nurse Practitioners) who all work  together to provide you with the care you need, when you need it.  Your next appointment:   12 months  The format for your next appointment:   In Person  Provider:   Sanda Klein, MD      Signed, Sanda Klein, MD  10/02/2019 12:52 PM    [place]

## 2019-09-25 NOTE — Patient Instructions (Signed)

## 2019-10-02 ENCOUNTER — Ambulatory Visit: Payer: BC Managed Care – PPO | Admitting: Psychiatry

## 2019-10-02 ENCOUNTER — Encounter: Payer: Self-pay | Admitting: Cardiovascular Disease

## 2019-10-16 DIAGNOSIS — M25521 Pain in right elbow: Secondary | ICD-10-CM | POA: Diagnosis not present

## 2019-10-17 ENCOUNTER — Ambulatory Visit (INDEPENDENT_AMBULATORY_CARE_PROVIDER_SITE_OTHER): Payer: BC Managed Care – PPO | Admitting: Psychiatry

## 2019-10-17 ENCOUNTER — Encounter: Payer: Self-pay | Admitting: Psychiatry

## 2019-10-17 ENCOUNTER — Other Ambulatory Visit: Payer: Self-pay

## 2019-10-17 VITALS — Wt 242.0 lb

## 2019-10-17 DIAGNOSIS — F4310 Post-traumatic stress disorder, unspecified: Secondary | ICD-10-CM | POA: Diagnosis not present

## 2019-10-17 DIAGNOSIS — F39 Unspecified mood [affective] disorder: Secondary | ICD-10-CM | POA: Diagnosis not present

## 2019-10-17 DIAGNOSIS — G4700 Insomnia, unspecified: Secondary | ICD-10-CM

## 2019-10-17 DIAGNOSIS — F411 Generalized anxiety disorder: Secondary | ICD-10-CM

## 2019-10-17 MED ORDER — FLUOXETINE HCL 20 MG PO CAPS: ORAL_CAPSULE | ORAL | 1 refills | Status: DC

## 2019-10-17 NOTE — Progress Notes (Signed)
Amy [name] [id number] July 18, 1966 53 y.o.  Subjective:   Patient ID:  Amy [name] is a 53 y.o. (DOB 01-19-1966) female.  Chief Complaint:  Chief Complaint  Patient presents with  . Depression  . Anxiety  . Sleeping Problem    HPI [name] presents to the office today for follow-up of anxiety, mood disturbance, and insomnia. She reports that she hs had several visits to the hospital. Reports that she had stopped Sertraline. Reports that her PCP re-started her on Prozac. She reports that she continues to have significant depression, anxiety, and panic attacks.   She reports that her LFT's are elevated and this is causing her anxiety. Had severe anxiety with MRI due to claustrophobia.Reports that she is awakening every night at 2 am and is replaying events in her mind. Reports increase in intrusive memories and flashbacks. Reports that October is anniversary of a traumatic event. Reports that she has been having nightmares and that husband has told her that she is yelling out in her sleep. Reports exaggerated startles response. Reports panic attacks 2-3 times a week. Reports that she has anxiety about social situations. Frequent worry and catastrophic thinking. Frequently checks to make sure door is locked.   She reports that she has severe anger and started shouting at some people in the pharmacy when she overheard them discussing politics. She reports that she feels "furious" over certain current events. Denies impulsive or risky behavior.   Reports persistent sad mood. She reports low energy and motivation. Reports that she does not remember when she last washed her hair. Reports that sleep is erratic. Sometimes able to fall back asleep at 6:30 or 7 am after awakening at 2 am. She reports wt. gain. Reports anhedonia. Reports some passive death wishes. Denies SI.   Reports feeling that people are out to get her and that she is being watched. +AH- thinks she hears her husband when  he is not home. Also thinks someone is at the door.   Past Medication Trials: Abilify- Irritable, restless, nausea Vraylar- akathisia Latuda- Effective, then stopped working Lamotrigine  Prozac- Somewhat helpful for mood and anxiety.  Trileptal Sertraline- Limited improvement in mood and anxiety s/s.  Prozac Propranolol- effective for akathisia. May have been helpful for anxiety. Buspar- "did not like the way it made me feel"   Review of Systems:  Review of Systems  Cardiovascular: Negative for chest pain and palpitations.  Musculoskeletal: Negative for gait problem.       Reports recent flank pain  Neurological: Positive for tremors.       Neuropathy and weakenss in arm  Psychiatric/Behavioral:       Please refer to HPI    Medications: I have reviewed the patient's current medications.  Current Outpatient Medications  Medication Sig Dispense Refill  . ALPRAZolam (XANAX) 0.5 MG tablet Take 0.5 mg by mouth 4 (four) times daily as needed for anxiety.     . Cholecalciferol (D3-50 PO) Take by mouth.    . famotidine (PEPCID) 20 MG tablet Take 20 mg by mouth 2 (two) times daily as needed.     [name] FLUoxetine (PROZAC) 20 MG capsule Take 1 capsule with 40 mg capsule to equal total dose of 60 mg 30 capsule 1  . loratadine (CLARITIN) 10 MG tablet Take 10 mg by mouth daily.    [name] losartan (COZAAR) 50 MG tablet Take 50 mg by mouth daily.    . metFORMIN (GLUCOPHAGE-XR) 500 MG 24 hr tablet Take 500  mg by mouth daily with breakfast. *May take additional tablet in the evening for high blood glucose levels    . omeprazole (PRILOSEC) 20 MG capsule Take 20 mg by mouth daily as needed.     No current facility-administered medications for this visit.     Medication Side Effects: None  Allergies:  Allergies  Allergen Reactions  . Hydromorphone Nausea And Vomiting  . Ciprofloxacin Rash    Past Medical History:  Diagnosis Date  . Anemia   . Anxiety    xanax prn  . Blood transfusion  without reported diagnosis   . Degenerative disc disease   . Diabetes mellitus without complication ([place])   . Fatty liver   . Fibromyalgia   . GERD (gastroesophageal reflux disease)    prilosec otc-instructed to take dos  . Hot flashes 02/19/2014  . Hypertension    controlled on hctz-bp at pat 145/94  . Neuromuscular disorder ([place])    firbrmylagia  . Obesity   . PONV (postoperative nausea and vomiting)    pt states has not had ponv but has been treated in past with scopolamine, does admit to history of motion sickness, history of vertigo  . Vertigo    recent diagnosis of vertigo-uses otc meclizine  . Weight gain 02/19/2014    Family History  Problem Relation Age of Onset  . Cancer Paternal Uncle        liver   . Cancer Maternal Grandmother        throat  . Arthritis Mother   . Anxiety disorder Mother   . Depression Mother   . Diabetes Father   . Hypertension Father   . Arthritis Sister   . Diabetes Maternal Grandfather   . Hypertension Maternal Grandfather   . Diabetes Paternal Grandmother   . Hypertension Paternal Grandmother   . Anxiety disorder Paternal Grandmother   . Diabetes Paternal Grandfather   . Hypertension Paternal Grandfather   . Mood Disorder Brother   . Arthritis Sister   . Panic disorder Sister   . Psychosis Maternal Uncle   . Mood Disorder Maternal Uncle   . Anxiety disorder Paternal Uncle     Social History   Socioeconomic History  . Marital status: Married    Spouse name: Not on file  . Number of children: Not on file  . Years of education: Not on file  . Highest education level: Not on file  Occupational History  . Not on file  Social Needs  . Financial resource strain: Not on file  . Food insecurity    Worry: Not on file    Inability: Not on file  . Transportation needs    Medical: Not on file    Non-medical: Not on file  Tobacco Use  . Smoking status: Never Smoker  . Smokeless tobacco: Never Used  Substance and Sexual Activity   . Alcohol use: No  . Drug use: No  . Sexual activity: Yes    Partners: Male    Birth control/protection: Surgical    Comment: married  Lifestyle  . Physical activity    Days per week: Not on file    Minutes per session: Not on file  . Stress: Not on file  Relationships  . Social Herbalist on phone: Not on file    Gets together: Not on file    Attends religious service: Not on file    Active member of club or organization: Not on file    Attends meetings  of clubs or organizations: Not on file    Relationship status: Not on file  . Intimate partner violence    Fear of current or ex partner: Not on file    Emotionally abused: Not on file    Physically abused: Not on file    Forced sexual activity: Not on file  Other Topics Concern  . Not on file  Social History Narrative  . Not on file    Past Medical History, Surgical history, Social history, and Family history were reviewed and updated as appropriate.   Please see review of systems for further details on the patient's review from today.   Objective:   Physical Exam:  Wt 242 lb (109.8 kg)   LMP 05/01/2011   BMI 40.27 kg/m   Physical Exam Constitutional:      General: She is not in acute distress.    Appearance: She is well-developed.  Musculoskeletal:        General: No deformity.  Neurological:     Mental Status: She is alert and oriented to person, place, and time.     Coordination: Coordination normal.  Psychiatric:        Attention and Perception: Attention and perception normal. She does not perceive auditory or visual hallucinations.        Mood and Affect: Mood is anxious and depressed. Affect is not labile, blunt, angry or inappropriate.        Speech: Speech normal.        Behavior: Behavior is cooperative.        Thought Content: Thought content is paranoid. Thought content does not include homicidal or suicidal ideation. Thought content does not include homicidal or suicidal plan.         Cognition and Memory: Cognition and memory normal.        Judgment: Judgment normal.     Comments: Insight intact.      Lab Review:     Component Value Date/Time   NA 136 07/21/2019 1550   K 2.7 (LL) 07/21/2019 1550   CL 95 (L) 07/21/2019 1550   CO2 30 07/21/2019 1550   GLUCOSE 146 (H) 07/21/2019 1550   BUN 8 07/21/2019 1550   CREATININE 0.61 07/21/2019 1550   CREATININE 0.69 02/19/2014 1542   CALCIUM 9.2 07/21/2019 1550   PROT 7.5 07/21/2019 1550   ALBUMIN 4.0 07/21/2019 1550   AST 37 07/21/2019 1550   ALT 46 (H) 07/21/2019 1550   ALKPHOS 92 07/21/2019 1550   BILITOT 0.7 07/21/2019 1550   GFRNONAA >60 07/21/2019 1550   GFRAA >60 07/21/2019 1550       Component Value Date/Time   WBC 10.8 (H) 07/21/2019 1550   RBC 5.62 (H) 07/21/2019 1550   HGB 16.1 (H) 07/21/2019 1550   HCT 48.6 (H) 07/21/2019 1550   PLT 302 07/21/2019 1550   MCV 86.5 07/21/2019 1550   MCH 28.6 07/21/2019 1550   MCHC 33.1 07/21/2019 1550   RDW 13.6 07/21/2019 1550   LYMPHSABS 2.1 07/12/2016 2349   MONOABS 0.6 07/12/2016 2349   EOSABS 0.1 07/12/2016 2349   BASOSABS 0.0 07/12/2016 2349    No results found for: POCLITH, LITHIUM   No results found for: PHENYTOIN, PHENOBARB, VALPROATE, CBMZ   .res Assessment: Plan:   Will increase Prozac to 60 mg po qd to improve mood and anxiety. Discussed potential benefits, risks, and side effects of increase in Prozac. Advised pt to contact clinic if she experiences worsening depression, irritability, mood lability, or insomnia  since it is unclear if past impulsivity was related to hypomania. Recommend resuming psychotherapy. Discussed that anniversary of traumatic event and recent conflict with family member likely exacerbated PTSD s/s. Pt to f/u in 4 weeks or sooner if clinically indicated. Patient advised to contact office with any questions, adverse effects, or acute worsening in signs and symptoms.   Amy [name] was seen today for depression, anxiety and  sleeping problem.  Diagnoses and all orders for this visit:  PTSD (post-traumatic stress disorder) -     FLUoxetine (PROZAC) 20 MG capsule; Take 1 capsule with 40 mg capsule to equal total dose of 60 mg  Generalized anxiety disorder -     FLUoxetine (PROZAC) 20 MG capsule; Take 1 capsule with 40 mg capsule to equal total dose of 60 mg  Insomnia, unspecified type  Episodic mood disorder (H[facility]) -     FLUoxetine (PROZAC) 20 MG capsule; Take 1 capsule with 40 mg capsule to equal total dose of 60 mg     Please see After Visit Summary for patient specific instructions.  Future Appointments  Date Time Provider [place]  11/13/2019  8:30 AM Thayer Headings, PMHNP CP-CP None    No orders of the defined types were placed in this encounter.   -------------------------------

## 2019-10-21 ENCOUNTER — Ambulatory Visit: Payer: BC Managed Care – PPO | Admitting: Psychiatry

## 2019-10-22 ENCOUNTER — Other Ambulatory Visit: Payer: Self-pay

## 2019-10-22 DIAGNOSIS — Z20822 Contact with and (suspected) exposure to covid-19: Secondary | ICD-10-CM

## 2019-10-24 LAB — NOVEL CORONAVIRUS, NAA: SARS-CoV-2, NAA: NOT DETECTED

## 2019-10-29 ENCOUNTER — Telehealth: Payer: Self-pay | Admitting: *Deleted

## 2019-10-29 NOTE — Telephone Encounter (Signed)
Pt given result of COVID test; she verbalized understanding.

## 2019-11-10 ENCOUNTER — Other Ambulatory Visit: Payer: Self-pay

## 2019-11-10 ENCOUNTER — Telehealth: Payer: Self-pay | Admitting: Psychiatry

## 2019-11-10 NOTE — Telephone Encounter (Signed)
Patient called and said pharmacy will not fill prescription because of insurance. So please send a pa for the prozac 20 mg

## 2019-11-10 NOTE — Telephone Encounter (Signed)
Clarified information with pharmacy and there was not an issue. Patient taking (1) 20 mg and (1) 40 mg Prozac to equal 60 mg per day

## 2019-11-11 DIAGNOSIS — E1165 Type 2 diabetes mellitus with hyperglycemia: Secondary | ICD-10-CM | POA: Diagnosis not present

## 2019-11-11 DIAGNOSIS — M25521 Pain in right elbow: Secondary | ICD-10-CM | POA: Diagnosis not present

## 2019-11-11 DIAGNOSIS — Z6841 Body Mass Index (BMI) 40.0 and over, adult: Secondary | ICD-10-CM | POA: Diagnosis not present

## 2019-11-11 DIAGNOSIS — F419 Anxiety disorder, unspecified: Secondary | ICD-10-CM | POA: Diagnosis not present

## 2019-11-11 MED ORDER — FLUOXETINE HCL 40 MG PO CAPS: ORAL_CAPSULE | ORAL | 1 refills | Status: DC

## 2019-11-12 DIAGNOSIS — Z6841 Body Mass Index (BMI) 40.0 and over, adult: Secondary | ICD-10-CM | POA: Diagnosis not present

## 2019-11-12 DIAGNOSIS — Z87898 Personal history of other specified conditions: Secondary | ICD-10-CM | POA: Diagnosis not present

## 2019-11-13 ENCOUNTER — Ambulatory Visit: Payer: BC Managed Care – PPO | Admitting: Psychiatry

## 2019-11-21 IMAGING — CT CT ANGIOGRAPHY CHEST
2 of 6 series · 18 of 46 positions shown · IV contrast (Isovue)
Comparison: 01/04/2019.

CLINICAL DATA: Heartburn type chest pain for the past 3 days. Right
arm pain. Elevated D-dimer.

EXAM:
CT ANGIOGRAPHY CHEST WITH CONTRAST
TECHNIQUE: Multidetector CT imaging of the chest was performed using the
standard protocol during bolus administration of intravenous
contrast. Multiplanar CT image reconstructions and MIPs were
obtained to evaluate the vascular anatomy.
CONTRAST:  100mL OMNIPAQUE IOHEXOL 350 MG/ML SOLN

[Series 5: 3 thins · axial · 0.68mm/px · z∈[-198,+58]mm · 15 of 282 slices shown]
[im 13/282  lung]
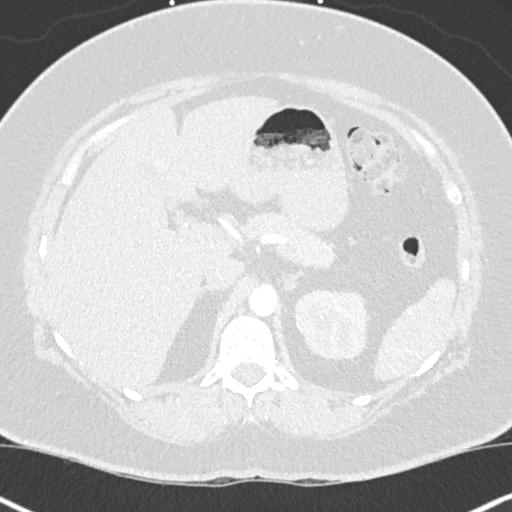
[im 37/282  soft-tissue]
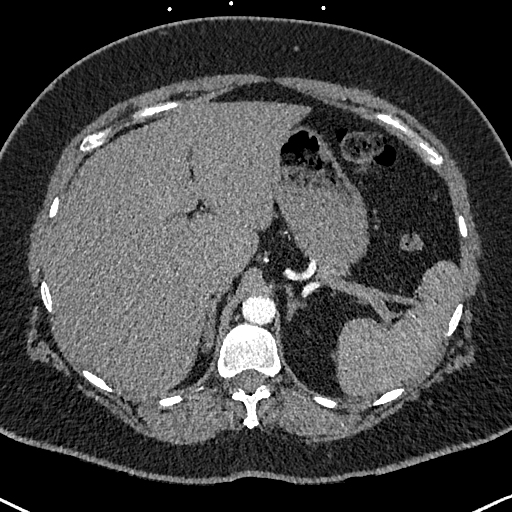
[im 49/282  lung]
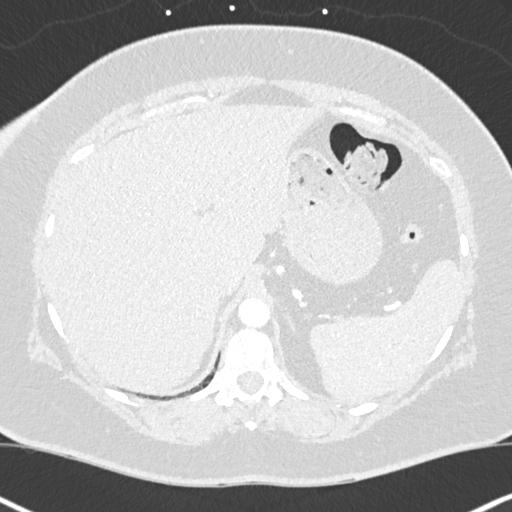
[im 74/282  soft-tissue]
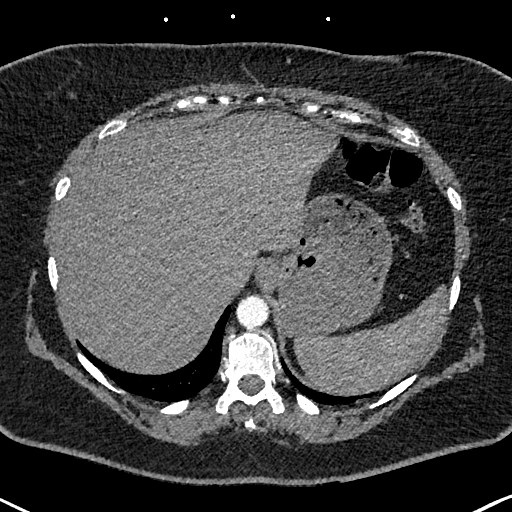
[im 86/282  lung]
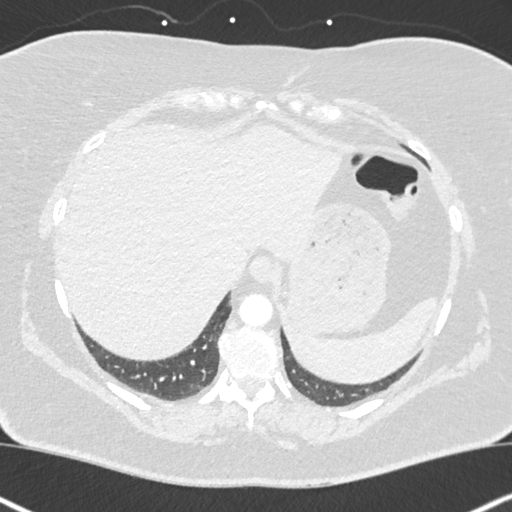
[im 110/282  soft-tissue]
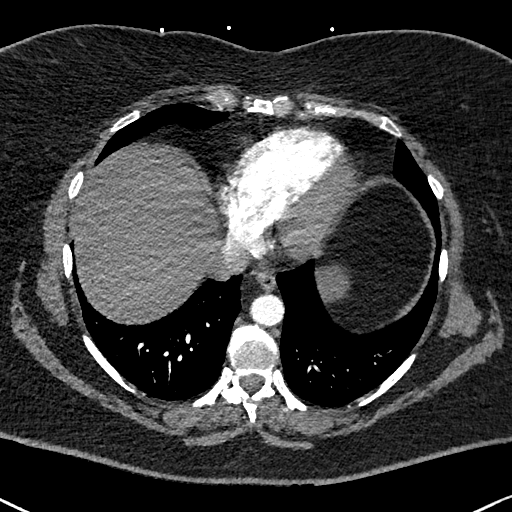
[im 123/282  lung]
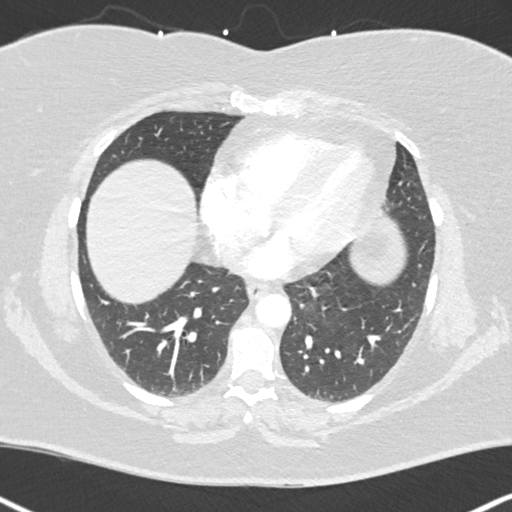
[im 147/282  soft-tissue]
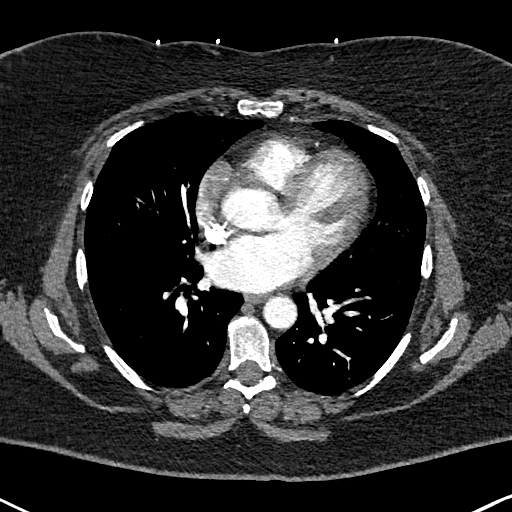
[im 159/282  lung]
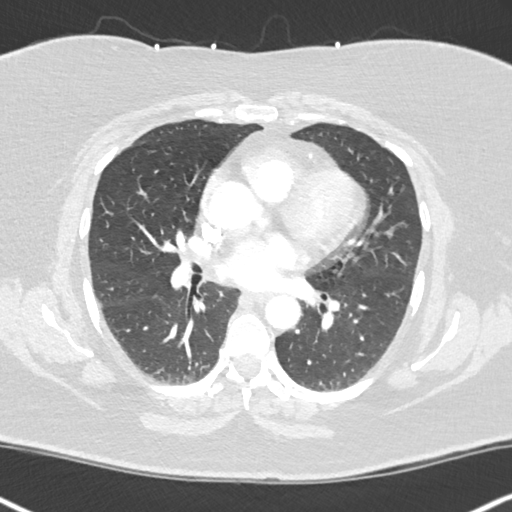
[im 172/282  soft-tissue]
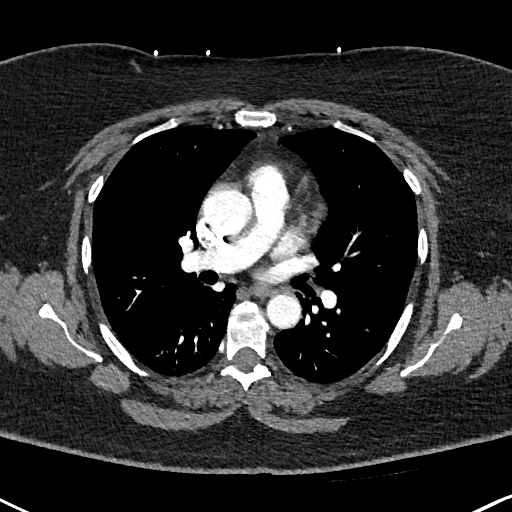
[im 196/282  lung]
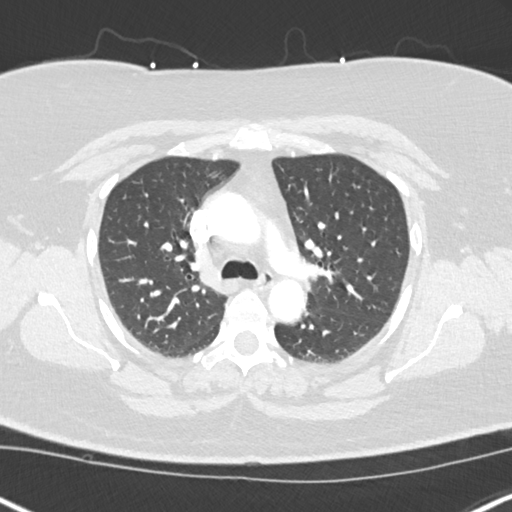
[im 208/282  soft-tissue]
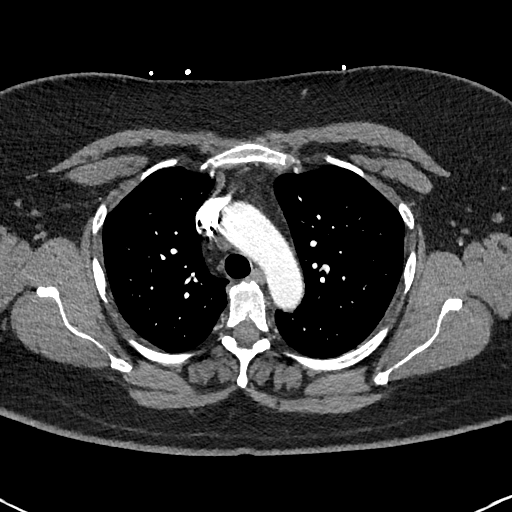
[im 233/282  lung]
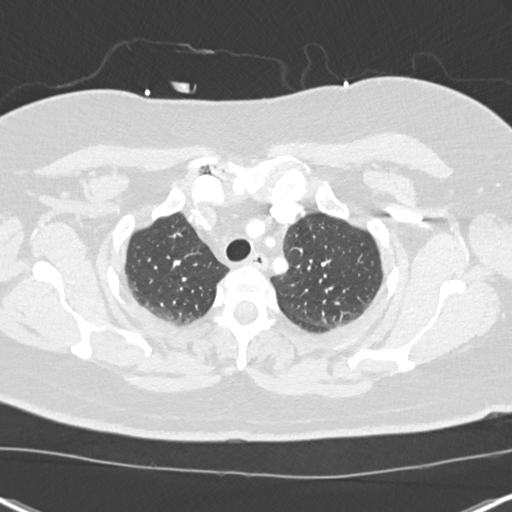
[im 245/282  soft-tissue]
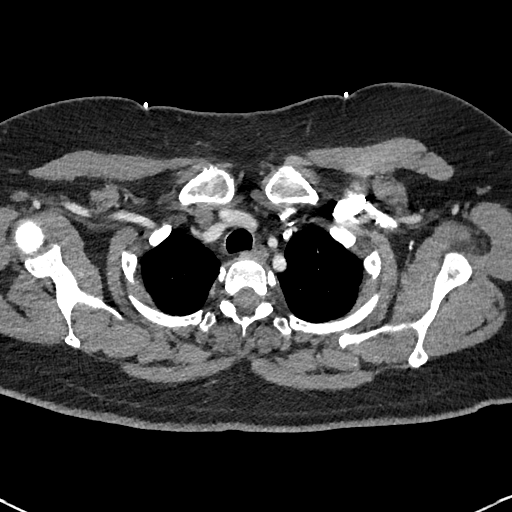
[im 269/282  lung]
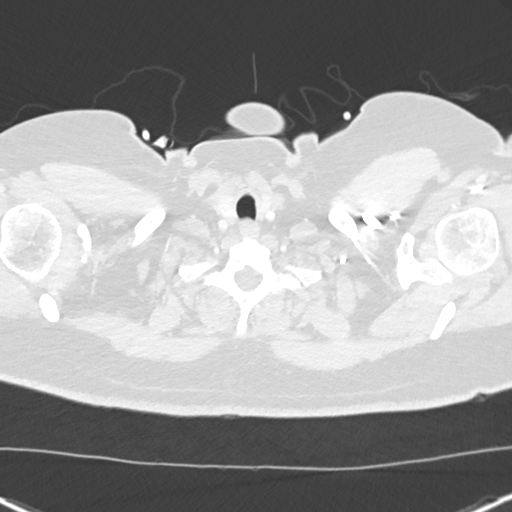

[Series 8: coronal mpr · coronal · 0.55mm/px · 3 of 140 slices shown]
[im 35/140  soft-tissue]
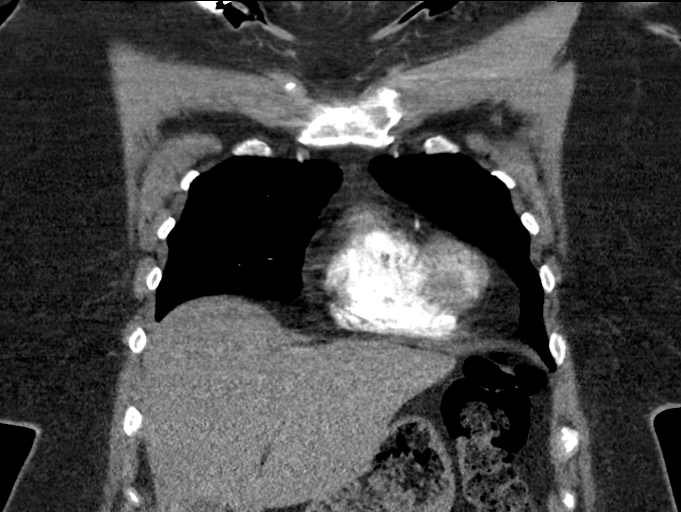
[im 70/140  soft-tissue]
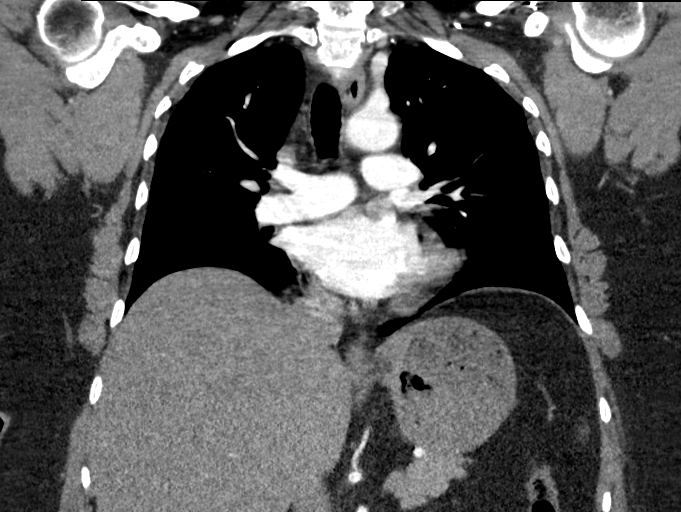
[im 105/140  soft-tissue]
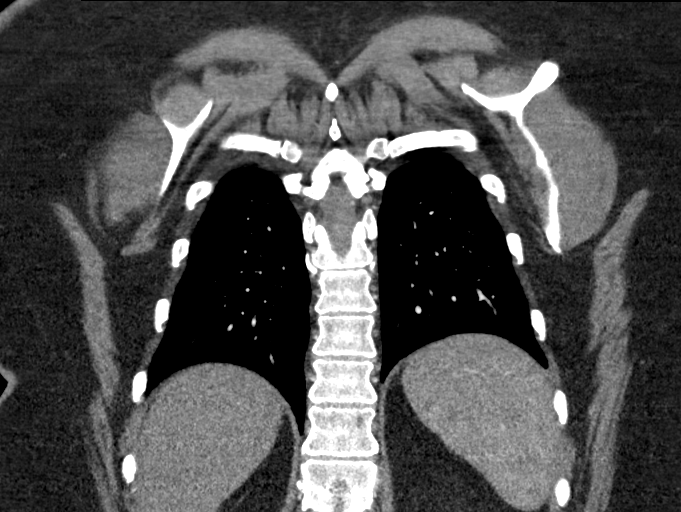

[18 of 46 positions shown; findings below may reference images not displayed]

FINDINGS: Cardiovascular: Satisfactory opacification of the pulmonary arteries
to the segmental level. No evidence of pulmonary embolism. Normal
heart size. No pericardial effusion.

Mediastinum/Nodes: No enlarged mediastinal, hilar, or axillary lymph
nodes. Thyroid gland, trachea, and esophagus demonstrate no
significant findings.

Lungs/Pleura: Lungs are clear. No pleural effusion or pneumothorax.

Upper Abdomen: Unremarkable.

Musculoskeletal: Thoracic spine degenerative changes.
Sternomanubrial and bilateral sternoclavicular joint degenerative
changes.

Review of the MIP images confirms the above findings.
IMPRESSION: No pulmonary emboli or acute abnormality.

## 2019-12-08 ENCOUNTER — Other Ambulatory Visit: Payer: Self-pay

## 2019-12-08 ENCOUNTER — Ambulatory Visit: Payer: BC Managed Care – PPO | Attending: Internal Medicine

## 2019-12-08 DIAGNOSIS — Z20822 Contact with and (suspected) exposure to covid-19: Secondary | ICD-10-CM | POA: Diagnosis not present

## 2019-12-10 LAB — NOVEL CORONAVIRUS, NAA: SARS-CoV-2, NAA: NOT DETECTED

## 2019-12-29 DIAGNOSIS — R945 Abnormal results of liver function studies: Secondary | ICD-10-CM | POA: Diagnosis not present

## 2020-01-02 ENCOUNTER — Other Ambulatory Visit: Payer: Self-pay

## 2020-01-02 ENCOUNTER — Ambulatory Visit: Payer: BC Managed Care – PPO | Admitting: Psychiatry

## 2020-01-02 ENCOUNTER — Encounter: Payer: Self-pay | Admitting: Psychiatry

## 2020-01-02 ENCOUNTER — Ambulatory Visit (INDEPENDENT_AMBULATORY_CARE_PROVIDER_SITE_OTHER): Payer: BC Managed Care – PPO | Admitting: Psychiatry

## 2020-01-02 DIAGNOSIS — F39 Unspecified mood [affective] disorder: Secondary | ICD-10-CM | POA: Diagnosis not present

## 2020-01-02 DIAGNOSIS — F4310 Post-traumatic stress disorder, unspecified: Secondary | ICD-10-CM

## 2020-01-02 DIAGNOSIS — F411 Generalized anxiety disorder: Secondary | ICD-10-CM

## 2020-01-02 MED ORDER — LATUDA 20 MG PO TABS: ORAL_TABLET | ORAL | 0 refills | Status: DC

## 2020-01-02 MED ORDER — FLUOXETINE HCL 20 MG PO CAPS: ORAL_CAPSULE | ORAL | 0 refills | Status: DC

## 2020-01-02 MED ORDER — FLUOXETINE HCL 40 MG PO CAPS: ORAL_CAPSULE | ORAL | 0 refills | Status: DC

## 2020-01-02 NOTE — Progress Notes (Signed)
[facility] Counselor Initial Adult Exam  Name: Amy [name] Date: 01/02/2020 MRN: [id number] DOB: 07-17-1966 PCP: [employer], MD  Time spent: 58 minutes   Guardian/Payee:  None    Paperwork requested:  No   Reason for Visit /Presenting Problem: This is a 54 year old married white female who comes in today very tearful and anxious.  She comes in with the diagnosis of posttraumatic stress disorder.  She had a miscarriage in 1992 which was traumatic for her.  She had a lot of shame connected to it because although she was engaged she was not married.  In 2002 she had a second pregnancy where at 4 months she began to hemorrhage.  She had to have emergency surgery and received 4 units of blood and lost the baby.  "It was horrible."  The client and her husband do not have children.  Soon after 2002 her husband developed testicular cancer and has been successfully treated for that.  In 2006 the client had a procedure done by a fertility specialist that was unsuccessful.  She found that devastating. 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  In the last year she has been unable to continue that because of the significant rise in her blood pressure.  This has been a loss for her.  She has nieces and nephews that she loves very much.  A few months ago her older brother said very hateful things to her about her weight gain (100 pounds).  This also is another layer of the shame and grief she feels. Goals 1) resolve the traumas around both of her failed pregnancies.   2) reduce her level of anxiety to less than 2 from the current state of 10+. 3) reduce her sadness to less than 2. 4) restructure her negative cognitions to more appropriate positive ones. 5) eliminate troublesome emotions connected to past memories. I discussed the use of EMDR with the client who agreed to treatment.  I explained how we could help neutralize the traumatic events and memories and restructure the  way that she thinks about those as well as reducing troublesome emotions.  Her negative cognitions are, "I feel like a bad horrible person when I got pregnant when I was not married.  I feel powerless.  I feel uncertain.  Why did God let this happen?"  I used the bilateral stimulation hand paddles to begin to integrate a relaxation place for the client.  She chose the [employer].  As she use the hand paddles she found it difficult to think about the beach because there were so many other competing thoughts.  She was able to reduce her subjective units of distress from a 10+ to less than 6.  We agreed to focus on her traumas at next session using the EMDR.  Mental Status Exam:   Appearance:   Well Groomed     Behavior:  Appropriate  Motor:  Normal  Speech/Language:   Clear and Coherent  Affect:  Tearful  Mood:  anxious, depressed and sad  Thought process:  normal  Thought content:    Rumination  Sensory/Perceptual disturbances:    WNL  Orientation:  oriented to person, place, time/date and situation  Attention:  Good  Concentration:  Good  Memory:  WNL  Fund of knowledge:   Good  Insight:    Good  Judgment:   Good  Impulse Control:  Good   Reported Symptoms: Intrusive distressing memories of the traumatic events, psychological distress  at exposure to internal or external cues related to her failed pregnancies, physiological reaction to internal or external cues related to the traumatic events, inability to remember aspects of what happened, persistent negative cognitions, persistent distorted cognitions about what happened, markedly diminished interest or participation in significant activities hypervigilance, exaggerated startle response, problems with concentration, sleep disruption, depersonalization.  Risk Assessment: Danger to Self:  No Self-injurious Behavior: No Danger to Others: No Duty to Warn:no Physical Aggression / Violence:No  Access to Firearms a concern: No  Gang  Involvement:No  Patient / guardian was educated about steps to take if suicide or homicide risk level increases between visits: yes While future psychiatric events cannot be accurately predicted, the patient does not currently require acute inpatient psychiatric care and does not currently meet [facility] involuntary commitment criteria.  Substance Abuse History: Current substance abuse: No     Past Psychiatric History:   Previous psychological history is significant for PTSD Outpatient Providers:Carson Sarvis, [facility] History of Psych Hospitalization: No  Psychological Testing: NA   Abuse History: Victim of No., None   Report needed: No. Victim of Neglect:No. Perpetrator of None  Witness / Exposure to Domestic Violence: No   Protective Services Involvement: No  Witness to [employer] Violence:  No   Family History:  Family History  Problem Relation Age of Onset  . Cancer Paternal Uncle        liver   . Cancer Maternal Grandmother        throat  . Arthritis Mother   . Anxiety disorder Mother   . Depression Mother   . Diabetes Father   . Hypertension Father   . Arthritis Sister   . Diabetes Maternal Grandfather   . Hypertension Maternal Grandfather   . Diabetes Paternal Grandmother   . Hypertension Paternal Grandmother   . Anxiety disorder Paternal Grandmother   . Diabetes Paternal Grandfather   . Hypertension Paternal Grandfather   . Mood Disorder Brother   . Arthritis Sister   . Panic disorder Sister   . Psychosis Maternal Uncle   . Mood Disorder Maternal Uncle   . Anxiety disorder Paternal Uncle     Living situation: the patient lives with their spouse  Sexual Orientation:  Straight  Relationship Status: married  Name of spouse / other:?             If a parent, number of children / ages:NONE  Support Systems; spouse  Financial Stress:  No   Income/Employment/Disability: Employment  Armed forces logistics/support/administrative officer: No   Educational History: Education: high school  diploma/GED  Religion/Sprituality/World View:   Protestant  Any cultural differences that Moranda Billiot affect / interfere with treatment:  not applicable   Recreation/Hobbies: reading  Stressors:Traumatic event  Strengths:  Family, Friends and Church  Barriers:  None   Legal History: Pending legal issue / charges: The patient has no significant history of legal issues. History of legal issue / charges: None  Medical History/Surgical History:not reviewed Past Medical History:  Diagnosis Date  . Anemia   . Anxiety    xanax prn  . Blood transfusion without reported diagnosis   . Degenerative disc disease   . Diabetes mellitus without complication ([place])   . Fatty liver   . Fibromyalgia   . GERD (gastroesophageal reflux disease)    prilosec otc-instructed to take dos  . Hot flashes 02/19/2014  . Hypertension    controlled on hctz-bp at pat 145/94  . Neuromuscular disorder ([place])    firbrmylagia  . Obesity   .  PONV (postoperative nausea and vomiting)    pt states has not had ponv but has been treated in past with scopolamine, does admit to history of motion sickness, history of vertigo  . Vertigo    recent diagnosis of vertigo-uses otc meclizine  . Weight gain 02/19/2014    Past Surgical History:  Procedure Laterality Date  . ABDOMINAL HYSTERECTOMY    . BREAST REDUCTION SURGERY     2010  . ECTOPIC PREGNANCY SURGERY     2002  . LAPAROSCOPIC ASSISTED VAGINAL HYSTERECTOMY  06/14/2011   Procedure: LAPAROSCOPIC ASSISTED VAGINAL HYSTERECTOMY;  Surgeon: Luz Lex, MD;  Location: [facility];  Service: Gynecology;  Laterality: N/A;  Abdomen and vagina  . LAPAROSCOPY     2006-for adhesions    Medications: Current Outpatient Medications  Medication Sig Dispense Refill  . ALPRAZolam (XANAX) 0.5 MG tablet Take 0.5 mg by mouth 4 (four) times daily as needed for anxiety.     . Cholecalciferol (D3-50 PO) Take by mouth.    . famotidine (PEPCID) 20 MG tablet Take 20 mg by mouth 2 (two) times  daily as needed.     [name] FLUoxetine (PROZAC) 20 MG capsule Take 1 capsule with 40 mg capsule to equal total dose of 60 mg 90 capsule 0  . FLUoxetine (PROZAC) 40 MG capsule Take 1 capsule (40 mg) by mouth with (20 mg capsule) to equal 60 mg 90 capsule 0  . loratadine (CLARITIN) 10 MG tablet Take 10 mg by mouth daily.    [name] losartan (COZAAR) 50 MG tablet Take 50 mg by mouth daily.    [name] lurasidone (LATUDA) 20 MG TABS tablet Take 1 tablet (20 mg total) by mouth daily with supper for 7 days, THEN 2 tablets (40 mg total) daily with supper for 21 days. 60 tablet 0  . metFORMIN (GLUCOPHAGE-XR) 500 MG 24 hr tablet Take 500 mg by mouth daily with breakfast. *Shanta Hartner take additional tablet in the evening for high blood glucose levels    . omeprazole (PRILOSEC) 20 MG capsule Take 20 mg by mouth daily as needed.     No current facility-administered medications for this visit.    Allergies  Allergen Reactions  . Hydromorphone Nausea And Vomiting  . Ciprofloxacin Rash    Diagnoses:    ICD-10-CM   1. PTSD (post-traumatic stress disorder)  F43.10     Plan of Care: I will treat the client with EMDR to help restructure her negative thoughts and reduce her emotional trauma and neutralize negative emotional content.  I will then help the client acquire skills that she will need to function effectively and then develop problem-solving strategies.   Eliga Arvie, [facility]

## 2020-01-02 NOTE — Progress Notes (Signed)
[name] [id number] 1966/11/07 54 y.o.  Subjective:   Patient ID:  Amy [name] is a 54 y.o. (DOB 04-03-1966) female.  Chief Complaint:  Chief Complaint  Patient presents with  . Anxiety  . Depression  . Paranoid    HPI Amy [name] presents to the office today for follow-up of anxiety and depression. She reports that her mind will "go blank" at times. Describes difficulty with concentration and memory. Reports that sometimes she cannot recall things, like whether she has taken her medication or not. Possible dissociation.  She reports that Prozac is partially effective. "I still have a lot of depression." She reports that that she still has significant anxiety with panic and PTSD. Recently had flashbacks and intrusive memories with anniversary of her first miscarriage. She reports nightmares a few times a month. She reports frequent panic attacks. Has anxiety in crowds and social situations. 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 Reports anger in response to certain triggers. Persistent sad mood. Difficulty staying asleep and awakens around 2 am repeatedly. Appetite has been ok. She reports that her energy is typically low. Motivation is low and reports that she will sometimes realize she has not washed her hair in a week. Appetite has been ok. She reports occasional paranoia and describes recent event where she thought an employee may have taken her money. Continues to feel that people may be watching her. She reports that she has impulsively made some purchases and then hid them from her husband. Reports thinking that she has heard her husband coming home when he is at work. Denies SI.   Past Medication Trials: Abilify- Irritable, restless, nausea Vraylar- akathisia Latuda- Effective, then stopped  working Lamotrigine  Prozac- Somewhat helpful for mood and anxiety.  Trileptal Sertraline- Limited improvement in mood and anxiety s/s.  Prozac Propranolol- effective for akathisia. May have been helpful for anxiety. Buspar- "did not like the way it made me feel"   PHQ2-9     Office Visit from 11/20/2018 in [facility]  PHQ-2 Total Score  0       Review of Systems:  Review of Systems  Musculoskeletal: Negative for gait problem.       Pain in right UE  Neurological: Negative for tremors.  Psychiatric/Behavioral:       Please refer to HPI    Medications: I have reviewed the patient's current medications.  Current Outpatient Medications  Medication Sig Dispense Refill  . ALPRAZolam (XANAX) 0.5 MG tablet Take 0.5 mg by mouth 4 (four) times daily as needed for anxiety.     . Cholecalciferol (D3-50 PO) Take by mouth.    . famotidine (PEPCID) 20 MG tablet Take 20 mg by mouth 2 (two) times daily as needed.     [name] FLUoxetine (PROZAC) 20 MG capsule Take 1 capsule with 40 mg capsule to equal total dose of 60 mg 90 capsule 0  . FLUoxetine (PROZAC) 40 MG capsule Take 1 capsule (40 mg) by mouth with (20 mg capsule) to equal 60 mg 90 capsule 0  . loratadine (CLARITIN) 10 MG tablet Take 10 mg by mouth daily.    [name] losartan (COZAAR) 50 MG tablet Take 50 mg by mouth daily.    . metFORMIN (GLUCOPHAGE-XR) 500 MG 24 hr tablet Take 500 mg by mouth daily with breakfast. *May take  additional tablet in the evening for high blood glucose levels    . lurasidone (LATUDA) 20 MG TABS tablet Take 1 tablet (20 mg total) by mouth daily with supper for 7 days, THEN 2 tablets (40 mg total) daily with supper for 21 days. 60 tablet 0  . omeprazole (PRILOSEC) 20 MG capsule Take 20 mg by mouth daily as needed.     No current facility-administered medications for this visit.    Medication Side Effects: None  Allergies:  Allergies  Allergen Reactions  . Hydromorphone Nausea And Vomiting  .  Ciprofloxacin Rash    Past Medical History:  Diagnosis Date  . Anemia   . Anxiety    xanax prn  . Blood transfusion without reported diagnosis   . Degenerative disc disease   . Diabetes mellitus without complication ([place])   . Fatty liver   . Fibromyalgia   . GERD (gastroesophageal reflux disease)    prilosec otc-instructed to take dos  . Hot flashes 02/19/2014  . Hypertension    controlled on hctz-bp at pat 145/94  . Neuromuscular disorder ([place])    firbrmylagia  . Obesity   . PONV (postoperative nausea and vomiting)    pt states has not had ponv but has been treated in past with scopolamine, does admit to history of motion sickness, history of vertigo  . Vertigo    recent diagnosis of vertigo-uses otc meclizine  . Weight gain 02/19/2014    Family History  Problem Relation Age of Onset  . Cancer Paternal Uncle        liver   . Cancer Maternal Grandmother        throat  . Arthritis Mother   . Anxiety disorder Mother   . Depression Mother   . Diabetes Father   . Hypertension Father   . Arthritis Sister   . Diabetes Maternal Grandfather   . Hypertension Maternal Grandfather   . Diabetes Paternal Grandmother   . Hypertension Paternal Grandmother   . Anxiety disorder Paternal Grandmother   . Diabetes Paternal Grandfather   . Hypertension Paternal Grandfather   . Mood Disorder Brother   . Arthritis Sister   . Panic disorder Sister   . Psychosis Maternal Uncle   . Mood Disorder Maternal Uncle   . Anxiety disorder Paternal Uncle     Social History   Socioeconomic History  . Marital status: Married    Spouse name: Not on file  . Number of children: Not on file  . Years of education: Not on file  . Highest education level: Not on file  Occupational History  . Not on file  Tobacco Use  . Smoking status: Never Smoker  . Smokeless tobacco: Never Used  Substance and Sexual Activity  . Alcohol use: No  . Drug use: No  . Sexual activity: Yes    Partners: Male     Birth control/protection: Surgical    Comment: married  Other Topics Concern  . Not on file  Social History Narrative  . Not on file   Social Determinants of Health   Financial Resource Strain:   . Difficulty of Paying Living Expenses: Not on file  Food Insecurity:   . Worried About Charity fundraiser in the Last Year: Not on file  . Ran Out of Food in the Last Year: Not on file  Transportation Needs:   . Lack of Transportation (Medical): Not on file  . Lack of Transportation (Non-Medical): Not on file  Physical Activity:   .  Days of Exercise per Week: Not on file  . Minutes of Exercise per Session: Not on file  Stress:   . Feeling of Stress : Not on file  Social Connections:   . Frequency of Communication with Friends and Family: Not on file  . Frequency of Social Gatherings with Friends and Family: Not on file  . Attends Religious Services: Not on file  . Active Member of Clubs or Organizations: Not on file  . Attends Archivist Meetings: Not on file  . Marital Status: Not on file  Intimate Partner Violence:   . Fear of Current or Ex-Partner: Not on file  . Emotionally Abused: Not on file  . Physically Abused: Not on file  . Sexually Abused: Not on file    Past Medical History, Surgical history, Social history, and Family history were reviewed and updated as appropriate.   Please see review of systems for further details on the patient's review from today.   Objective:   Physical Exam:  BP 128/83   Pulse 82   Wt 245 lb (111.1 kg)   LMP 05/01/2011   BMI 40.77 kg/m   Physical Exam Constitutional:      General: She is not in acute distress.    Appearance: She is well-developed.  Musculoskeletal:        General: No deformity.  Neurological:     Mental Status: She is alert and oriented to person, place, and time.     Coordination: Coordination normal.  Psychiatric:        Attention and Perception: Attention and perception normal. She does not  perceive auditory or visual hallucinations.        Mood and Affect: Mood is anxious and depressed. Affect is not labile, blunt, angry or inappropriate.        Speech: Speech normal.        Behavior: Behavior normal.        Thought Content: Thought content is paranoid. Thought content is not delusional. Thought content does not include homicidal or suicidal ideation. Thought content does not include homicidal or suicidal plan.        Cognition and Memory: Cognition and memory normal.        Judgment: Judgment normal.     Comments: Insight fair     Lab Review:     Component Value Date/Time   NA 136 07/21/2019 1550   K 2.7 (LL) 07/21/2019 1550   CL 95 (L) 07/21/2019 1550   CO2 30 07/21/2019 1550   GLUCOSE 146 (H) 07/21/2019 1550   BUN 8 07/21/2019 1550   CREATININE 0.61 07/21/2019 1550   CREATININE 0.69 02/19/2014 1542   CALCIUM 9.2 07/21/2019 1550   PROT 7.5 07/21/2019 1550   ALBUMIN 4.0 07/21/2019 1550   AST 37 07/21/2019 1550   ALT 46 (H) 07/21/2019 1550   ALKPHOS 92 07/21/2019 1550   BILITOT 0.7 07/21/2019 1550   GFRNONAA >60 07/21/2019 1550   GFRAA >60 07/21/2019 1550       Component Value Date/Time   WBC 10.8 (H) 07/21/2019 1550   RBC 5.62 (H) 07/21/2019 1550   HGB 16.1 (H) 07/21/2019 1550   HCT 48.6 (H) 07/21/2019 1550   PLT 302 07/21/2019 1550   MCV 86.5 07/21/2019 1550   MCH 28.6 07/21/2019 1550   MCHC 33.1 07/21/2019 1550   RDW 13.6 07/21/2019 1550   LYMPHSABS 2.1 07/12/2016 2349   MONOABS 0.6 07/12/2016 2349   EOSABS 0.1 07/12/2016 2349   BASOSABS 0.0  07/12/2016 2349    No results found for: POCLITH, LITHIUM   No results found for: PHENYTOIN, PHENOBARB, VALPROATE, CBMZ   .res Assessment: Plan:   Discussed several treatment options, to include [place] or restarting Latuda, since [place] has been in most effective for her mood and anxiety and then no longer seem to be working as well.  Reviewed potential benefits, risks, and side effects of Latuda.  Discussed potential metabolic side effects associated with atypical antipsychotics, as well as potential risk for movement side effects. Advised pt to contact office if movement side effects occur.   Patient agrees to retrial of Latuda. Will restart Latuda 20 mg daily with evening meal for 1 week, then increase to 40 mg daily with evening meal for mood and psychosis. Will continue Prozac 60 mg daily for depression and anxiety. Recommend continuing psychotherapy with Georgana Curio, LPC. Patient to follow-up with this provider in 4 to 6 weeks or sooner if clinically indicated. Patient advised to contact office with any questions, adverse effects, or acute worsening in signs and symptoms.   Amy [name] was seen today for anxiety, depression and paranoid.  Diagnoses and all orders for this visit:  PTSD (post-traumatic stress disorder) -     FLUoxetine (PROZAC) 20 MG capsule; Take 1 capsule with 40 mg capsule to equal total dose of 60 mg -     FLUoxetine (PROZAC) 40 MG capsule; Take 1 capsule (40 mg) by mouth with (20 mg capsule) to equal 60 mg  Generalized anxiety disorder -     FLUoxetine (PROZAC) 20 MG capsule; Take 1 capsule with 40 mg capsule to equal total dose of 60 mg -     FLUoxetine (PROZAC) 40 MG capsule; Take 1 capsule (40 mg) by mouth with (20 mg capsule) to equal 60 mg  Episodic mood disorder (HCC) -     lurasidone (LATUDA) 20 MG TABS tablet; Take 1 tablet (20 mg total) by mouth daily with supper for 7 days, THEN 2 tablets (40 mg total) daily with supper for 21 days. -     FLUoxetine (PROZAC) 20 MG capsule; Take 1 capsule with 40 mg capsule to equal total dose of 60 mg -     FLUoxetine (PROZAC) 40 MG capsule; Take 1 capsule (40 mg) by mouth with (20 mg capsule) to equal 60 mg     Please see After Visit Summary for patient specific instructions.  Future Appointments  Date Time Provider [place]  01/14/2020  8:00 AM May, [place], [facility] CP-CP None  01/30/2020  8:00 AM May,  [place], [facility] CP-CP None  01/30/2020  9:00 AM Thayer Headings, PMHNP CP-CP None    No orders of the defined types were placed in this encounter.   -------------------------------

## 2020-01-09 ENCOUNTER — Ambulatory Visit: Payer: BC Managed Care – PPO | Admitting: Psychiatry

## 2020-01-14 ENCOUNTER — Ambulatory Visit: Payer: BC Managed Care – PPO | Admitting: Psychiatry

## 2020-01-20 DIAGNOSIS — Z87898 Personal history of other specified conditions: Secondary | ICD-10-CM | POA: Diagnosis not present

## 2020-01-27 DIAGNOSIS — R635 Abnormal weight gain: Secondary | ICD-10-CM | POA: Diagnosis not present

## 2020-01-27 DIAGNOSIS — E1165 Type 2 diabetes mellitus with hyperglycemia: Secondary | ICD-10-CM | POA: Diagnosis not present

## 2020-01-27 DIAGNOSIS — Z6841 Body Mass Index (BMI) 40.0 and over, adult: Secondary | ICD-10-CM | POA: Diagnosis not present

## 2020-01-30 ENCOUNTER — Ambulatory Visit: Payer: BC Managed Care – PPO | Admitting: Psychiatry

## 2020-02-04 DIAGNOSIS — M2550 Pain in unspecified joint: Secondary | ICD-10-CM | POA: Diagnosis not present

## 2020-02-04 DIAGNOSIS — M353 Polymyalgia rheumatica: Secondary | ICD-10-CM | POA: Diagnosis not present

## 2020-02-04 DIAGNOSIS — Z6841 Body Mass Index (BMI) 40.0 and over, adult: Secondary | ICD-10-CM | POA: Diagnosis not present

## 2020-02-04 DIAGNOSIS — R5383 Other fatigue: Secondary | ICD-10-CM | POA: Diagnosis not present

## 2020-02-04 DIAGNOSIS — E1165 Type 2 diabetes mellitus with hyperglycemia: Secondary | ICD-10-CM | POA: Diagnosis not present

## 2020-02-04 DIAGNOSIS — F3181 Bipolar II disorder: Secondary | ICD-10-CM | POA: Diagnosis not present

## 2020-02-16 ENCOUNTER — Encounter: Payer: Self-pay | Admitting: Psychiatry

## 2020-02-16 ENCOUNTER — Ambulatory Visit (INDEPENDENT_AMBULATORY_CARE_PROVIDER_SITE_OTHER): Payer: BC Managed Care – PPO | Admitting: Psychiatry

## 2020-02-16 DIAGNOSIS — F39 Unspecified mood [affective] disorder: Secondary | ICD-10-CM | POA: Diagnosis not present

## 2020-02-16 DIAGNOSIS — F411 Generalized anxiety disorder: Secondary | ICD-10-CM | POA: Diagnosis not present

## 2020-02-16 DIAGNOSIS — F4310 Post-traumatic stress disorder, unspecified: Secondary | ICD-10-CM | POA: Diagnosis not present

## 2020-02-16 MED ORDER — FLUOXETINE HCL 40 MG PO CAPS: ORAL_CAPSULE | ORAL | 0 refills | Status: DC

## 2020-02-16 MED ORDER — CARBAMAZEPINE ER 100 MG PO CP12: 100.0000 mg | ORAL_CAPSULE | Freq: Every day | ORAL | 0 refills | Status: DC

## 2020-02-16 MED ORDER — CARBAMAZEPINE 200 MG PO TABS: 200.0000 mg | ORAL_TABLET | Freq: Three times a day (TID) | ORAL | 0 refills | Status: DC
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
## 2020-02-18 MED ORDER — CARBAMAZEPINE 200 MG PO TABS: ORAL_TABLET | ORAL | 0 refills | Status: DC

## 2020-02-19 ENCOUNTER — Ambulatory Visit: Payer: BC Managed Care – PPO | Admitting: Psychiatry

## 2020-02-19 DIAGNOSIS — M2550 Pain in unspecified joint: Secondary | ICD-10-CM | POA: Diagnosis not present

## 2020-02-19 DIAGNOSIS — R5382 Chronic fatigue, unspecified: Secondary | ICD-10-CM | POA: Diagnosis not present

## 2020-02-19 DIAGNOSIS — M797 Fibromyalgia: Secondary | ICD-10-CM | POA: Diagnosis not present

## 2020-03-15 ENCOUNTER — Ambulatory Visit: Payer: BC Managed Care – PPO | Admitting: Psychiatry

## 2020-03-24 ENCOUNTER — Other Ambulatory Visit: Payer: Self-pay

## 2020-03-24 ENCOUNTER — Ambulatory Visit (INDEPENDENT_AMBULATORY_CARE_PROVIDER_SITE_OTHER): Payer: BC Managed Care – PPO | Admitting: Psychiatry

## 2020-03-24 ENCOUNTER — Encounter: Payer: Self-pay | Admitting: Psychiatry

## 2020-03-24 DIAGNOSIS — F4310 Post-traumatic stress disorder, unspecified: Secondary | ICD-10-CM

## 2020-03-24 DIAGNOSIS — F411 Generalized anxiety disorder: Secondary | ICD-10-CM | POA: Diagnosis not present

## 2020-03-24 DIAGNOSIS — F39 Unspecified mood [affective] disorder: Secondary | ICD-10-CM | POA: Diagnosis not present

## 2020-03-24 MED ORDER — GABAPENTIN 100 MG PO CAPS: ORAL_CAPSULE | ORAL | 1 refills | Status: DC

## 2020-03-24 MED ORDER — FLUOXETINE HCL 40 MG PO CAPS: ORAL_CAPSULE | ORAL | 0 refills | Status: DC

## 2020-03-24 NOTE — Progress Notes (Signed)
Amy [name] [id number] 19-Jul-1966 55 y.o.  Subjective:   Patient ID:  Amy [name] is a 54 y.o. (DOB Feb 25, 1966) female.  Chief Complaint:  Chief Complaint  Patient presents with  . Anxiety  . Sleeping Problem  . Depression    HPI Amy [name] presents to the office today for follow-up of anxiety, depression, and insomnia.  She reports that she took Carbamazepine for about 1-1.5 weeks until she read some things about Carbamazepine that frighten her, such as possible liver issues. She denies having any side effects. She reports that her stomach was uncomfortable and distended and reports that this has persisted after stopping Carbamazepine.   Mood has been "sad, angry... then one moment I may be happy, and then sad again." She reports that her mood frequently shifts. Sleep has been poor. Has not been sleeping well and reports that she was up since 3 am. Husband has told her that she is snoring when she sleeps. Husband has not observed apneic episodes. She reports that she is frequently having "weird dreams." She reports that her appetite has been ok during the day and then will eat uncontrollably at night and will crave sweets. Has been trying to lose weight. She reports that her energy has been "so, so." Denies any recent elevated energy. Motivation has been low. Concentration has been poor. Denies SI.    Anxiety has been elevated. She reports having panic attacks about 2-3 times a week. She reports that she will start sweating excessively at times, such as when she is going into a store. She reports that she frequently fidgets when anxious. Frequent worry and anxious thoughts. Reports intrusive memories.   Denies impulsive or risky behavior. Episodes of anger.   She has been experiencing increased paranoia. Some AH.   Past Medication Trials: Abilify- Irritable, restless, nausea Vraylar- akathisia Latuda- Effective, then stopped working Lamotrigine  Prozac- Somewhat helpful  for mood and anxiety. Trileptal Sertraline- Limited improvement in mood and anxiety s/s.  Prozac Propranolol- effective for akathisia. May have been helpful for anxiety. Buspar- "did not like the way it made me feel" Xanax  PHQ2-9     Office Visit from 11/20/2018 in [facility]  PHQ-2 Total Score  0       Review of Systems:  Review of Systems  Gastrointestinal: Positive for abdominal pain.  Musculoskeletal: Positive for arthralgias and myalgias. Negative for gait problem.  Skin:       Frequently scratching and picking at scalp. She reports that her face is red at times.   Neurological: Negative for tremors.  Psychiatric/Behavioral:       Please refer to HPI  Saw rheumatologist and RA was ruled out. She reports that pain was attributed to fibromyalgia.   Medications: I have reviewed the patient's current medications.  Current Outpatient Medications  Medication Sig Dispense Refill  . ALPRAZolam (XANAX) 0.5 MG tablet Take 0.5 mg by mouth 4 (four) times daily as needed for anxiety.     . Cholecalciferol (D3-50 PO) Take by mouth.    . famotidine (PEPCID) 20 MG tablet Take 20 mg by mouth 2 (two) times daily as needed.     [name] FLUoxetine (PROZAC) 40 MG capsule Take 1 capsule (40 mg) by mouth with (20 mg capsule) to equal 60 mg 90 capsule 0  . loratadine (CLARITIN) 10 MG tablet Take 10 mg by mouth daily.    [name] losartan (COZAAR) 50 MG tablet Take 50 mg by mouth daily.    [name]  metFORMIN (GLUCOPHAGE-XR) 500 MG 24 hr tablet Take 500 mg by mouth daily with breakfast. *May take additional tablet in the evening for high blood glucose levels    . gabapentin (NEURONTIN) 100 MG capsule Take 1 capsule po QHS x 3 nights, then may increase to 2 capsules po QHS x 3 nights, then may increase to 3 caps po QHS as tolerated. 90 capsule 1   No current facility-administered medications for this visit.    Medication Side Effects: None  Allergies:  Allergies  Allergen Reactions  . Hydromorphone  Nausea And Vomiting  . Ciprofloxacin Rash    Past Medical History:  Diagnosis Date  . Anemia   . Anxiety    xanax prn  . Blood transfusion without reported diagnosis   . Degenerative disc disease   . Diabetes mellitus without complication ([place])   . Fatty liver   . Fibromyalgia   . GERD (gastroesophageal reflux disease)    prilosec otc-instructed to take dos  . Hot flashes 02/19/2014  . Hypertension    controlled on hctz-bp at pat 145/94  . Neuromuscular disorder ([place])    firbrmylagia  . Obesity   . PONV (postoperative nausea and vomiting)    pt states has not had ponv but has been treated in past with scopolamine, does admit to history of motion sickness, history of vertigo  . Vertigo    recent diagnosis of vertigo-uses otc meclizine  . Weight gain 02/19/2014    Family History  Problem Relation Age of Onset  . Cancer Paternal Uncle        liver   . Cancer Maternal Grandmother        throat  . Arthritis Mother   . Anxiety disorder Mother   . Depression Mother   . Diabetes Father   . Hypertension Father   . Arthritis Sister   . Diabetes Maternal Grandfather   . Hypertension Maternal Grandfather   . Diabetes Paternal Grandmother   . Hypertension Paternal Grandmother   . Anxiety disorder Paternal Grandmother   . Diabetes Paternal Grandfather   . Hypertension Paternal Grandfather   . Mood Disorder Brother   . Arthritis Sister   . Panic disorder Sister   . Psychosis Maternal Uncle   . Mood Disorder Maternal Uncle   . Anxiety disorder Paternal Uncle     Social History   Socioeconomic History  . Marital status: Married    Spouse name: Not on file  . Number of children: Not on file  . Years of education: Not on file  . Highest education level: Not on file  Occupational History  . Not on file  Tobacco Use  . Smoking status: Never Smoker  . Smokeless tobacco: Never Used  Substance and Sexual Activity  . Alcohol use: No  . Drug use: No  . Sexual activity: Yes     Partners: Male    Birth control/protection: Surgical    Comment: married  Other Topics Concern  . Not on file  Social History Narrative  . Not on file   Social Determinants of Health   Financial Resource Strain:   . Difficulty of Paying Living Expenses:   Food Insecurity:   . Worried About Charity fundraiser in the Last Year:   . Arboriculturist in the Last Year:   Transportation Needs:   . Film/video editor (Medical):   [name] Lack of Transportation (Non-Medical):   Physical Activity:   . Days of Exercise per Week:   .  Minutes of Exercise per Session:   Stress:   . Feeling of Stress :   Social Connections:   . Frequency of Communication with Friends and Family:   . Frequency of Social Gatherings with Friends and Family:   . Attends Religious Services:   . Active Member of Clubs or Organizations:   . Attends Archivist Meetings:   [name] Marital Status:   Intimate Partner Violence:   . Fear of Current or Ex-Partner:   . Emotionally Abused:   [name] Physically Abused:   . Sexually Abused:     Past Medical History, Surgical history, Social history, and Family history were reviewed and updated as appropriate.   Please see review of systems for further details on the patient's review from today.   Objective:   Physical Exam:  LMP 05/01/2011   Physical Exam Constitutional:      General: She is not in acute distress. Musculoskeletal:        General: No deformity.  Neurological:     Mental Status: She is alert and oriented to person, place, and time.     Coordination: Coordination normal.  Psychiatric:        Attention and Perception: Attention and perception normal. She does not perceive auditory or visual hallucinations.        Mood and Affect: Mood is anxious and depressed. Affect is not labile, blunt, angry or inappropriate.        Speech: Speech normal.        Behavior: Behavior normal.        Thought Content: Thought content is paranoid. Thought content  is not delusional. Thought content does not include homicidal or suicidal ideation. Thought content does not include homicidal or suicidal plan.        Cognition and Memory: Cognition and memory normal.        Judgment: Judgment normal.     Comments: Insight fair     Lab Review:     Component Value Date/Time   NA 136 07/21/2019 1550   K 2.7 (LL) 07/21/2019 1550   CL 95 (L) 07/21/2019 1550   CO2 30 07/21/2019 1550   GLUCOSE 146 (H) 07/21/2019 1550   BUN 8 07/21/2019 1550   CREATININE 0.61 07/21/2019 1550   CREATININE 0.69 02/19/2014 1542   CALCIUM 9.2 07/21/2019 1550   PROT 7.5 07/21/2019 1550   ALBUMIN 4.0 07/21/2019 1550   AST 37 07/21/2019 1550   ALT 46 (H) 07/21/2019 1550   ALKPHOS 92 07/21/2019 1550   BILITOT 0.7 07/21/2019 1550   GFRNONAA >60 07/21/2019 1550   GFRAA >60 07/21/2019 1550       Component Value Date/Time   WBC 10.8 (H) 07/21/2019 1550   RBC 5.62 (H) 07/21/2019 1550   HGB 16.1 (H) 07/21/2019 1550   HCT 48.6 (H) 07/21/2019 1550   PLT 302 07/21/2019 1550   MCV 86.5 07/21/2019 1550   MCH 28.6 07/21/2019 1550   MCHC 33.1 07/21/2019 1550   RDW 13.6 07/21/2019 1550   LYMPHSABS 2.1 07/12/2016 2349   MONOABS 0.6 07/12/2016 2349   EOSABS 0.1 07/12/2016 2349   BASOSABS 0.0 07/12/2016 2349    No results found for: POCLITH, LITHIUM   No results found for: PHENYTOIN, PHENOBARB, VALPROATE, CBMZ   .res Assessment: Plan:   Patient seen for 30 minutes and time spent counseling patient regarding her concerns about carbamazepine.  Discussed that increased liver enzymes is a possible, but not common side effect of carbamazepine.  Patient reports  that she would be willing to consider retrial of carbamazepine.  Also discussed patient having significant pain due to fibromyalgia that interrupts her sleep and causes distress.  Discussed potential benefits, risks, and side effects of gabapentin and discussed that this is used off label for fibromyalgia and anxiety.   Discussed that gabapentin may also improve sleep.  Patient reports that she would like to try gabapentin to improve the signs and symptoms.  Discussed starting with low-dose gabapentin and increasing based on response and tolerability.  Will start gabapentin 100 mg at bedtime for 3 nights, then increase to 200 mg at bedtime for 3 nights, then increase to 300 mg at bedtime.  Discussed that dose could be titrated further if she is noticing partial response without side effects and recommend that she contact office if this occurs.  Time also spent discussing sleep apnea and encourage patient to consider a sleep study to evaluate for possible sleep apnea since her husband reports that she has been snoring.  Offered to order home sleep study test or option to follow-up with PCP and possibly have sleep study in a sleep lab.  Patient reports that she has an upcoming appointment with her PCP and will talk with her PCP about it at that time. Continue Prozac 40 mg daily for anxiety depression. Patient to follow-up in 4 weeks or sooner if clinically indicated. Patient advised to contact office with any questions, adverse effects, or acute worsening in signs and symptoms.  Kinsie was seen today for anxiety, sleeping problem and depression.  Diagnoses and all orders for this visit:  PTSD (post-traumatic stress disorder) -     FLUoxetine (PROZAC) 40 MG capsule; Take 1 capsule (40 mg) by mouth with (20 mg capsule) to equal 60 mg  Generalized anxiety disorder -     gabapentin (NEURONTIN) 100 MG capsule; Take 1 capsule po QHS x 3 nights, then may increase to 2 capsules po QHS x 3 nights, then may increase to 3 caps po QHS as tolerated. -     FLUoxetine (PROZAC) 40 MG capsule; Take 1 capsule (40 mg) by mouth with (20 mg capsule) to equal 60 mg  Episodic mood disorder (HCC) -     FLUoxetine (PROZAC) 40 MG capsule; Take 1 capsule (40 mg) by mouth with (20 mg capsule) to equal 60 mg     Please see After Visit  Summary for patient specific instructions.  Future Appointments  Date Time Provider [place]  04/02/2020  8:00 AM May, [place], [facility] CP-CP None  04/13/2020 11:00 AM May, [place], [facility] CP-CP None  04/21/2020  9:30 AM Thayer Headings, PMHNP CP-CP None    No orders of the defined types were placed in this encounter.   -------------------------------

## 2020-03-31 ENCOUNTER — Telehealth: Payer: Self-pay | Admitting: Psychiatry

## 2020-03-31 DIAGNOSIS — F39 Unspecified mood [affective] disorder: Secondary | ICD-10-CM

## 2020-03-31 DIAGNOSIS — F411 Generalized anxiety disorder: Secondary | ICD-10-CM

## 2020-03-31 MED ORDER — CARBAMAZEPINE ER 100 MG PO CP12: ORAL_CAPSULE | ORAL | 0 refills | Status: DC

## 2020-03-31 NOTE — Telephone Encounter (Signed)
Patient called and said that the gabapentin said that she has taken it for 5 days now and feels very dizzy. She feels it is from this medicine. Please call her at [phone]

## 2020-04-02 ENCOUNTER — Other Ambulatory Visit: Payer: Self-pay

## 2020-04-02 ENCOUNTER — Ambulatory Visit (INDEPENDENT_AMBULATORY_CARE_PROVIDER_SITE_OTHER): Payer: BC Managed Care – PPO | Admitting: Psychiatry

## 2020-04-02 ENCOUNTER — Encounter: Payer: Self-pay | Admitting: Psychiatry

## 2020-04-02 DIAGNOSIS — F4310 Post-traumatic stress disorder, unspecified: Secondary | ICD-10-CM

## 2020-04-02 NOTE — Progress Notes (Signed)
[facility] Counselor/Therapist Progress Note  Patient ID: Amy [name], MRN: [id number],    Date: 04/02/2020  Time Spent: 50 minutes   Treatment Type: Individual Therapy  Reported Symptoms: anxious, physiological reaction to upcoming anniversaries, sadness, tearful, startle response, grief.  Mental Status Exam:  Appearance:   Well Groomed     Behavior:  Appropriate  Motor:  Normal  Speech/Language:   Clear and Coherent  Affect:  Tearful  Mood:  anxious and sad  Thought process:  normal  Thought content:    WNL  Sensory/Perceptual disturbances:    WNL  Orientation:  oriented to person, place, time/date and situation  Attention:  Good  Concentration:  Good  Memory:  WNL  Fund of knowledge:   Good  Insight:    Good  Judgment:   Good  Impulse Control:  Good   Risk Assessment: Danger to Self:  No Self-injurious Behavior: No Danger to Others: No Duty to Warn:no Physical Aggression / Violence:No  Access to Firearms a concern: No  Gang Involvement:No   Subjective: The client is very tearful today.  Her subjective units of distress with her anxiety is an 8+.  The client states that Mother's Day is coming up as well as July 2.  June 04, 2001 was the day she miscarried which has been a huge loss for her.  Every Mother's Day she is overwhelmed because she is not a mother.  I discussed with the client starting in with the EMDR today to help her process this trauma.  She agreed.  We focused using eye-movement on Mother's Day and July 2.  Her negative cognition is, "I am overwhelmed and scared."  As the client processed she thought how she almost lost her life that day.  She remembers the doctor sitting on the side of her bed telling her that he almost lost her.  "I was grateful I lived."  The client was concerned that she was being selfish with that thought.  I pointed out to the client that surviving was something she could not feel guilty for.  Right after that her husband got  cancer and she was able to be there for him.  She agreed.  As we continued to process the client realized that she is a mom even though her child is not here.  The client had named her baby Faith.  "I always thought she had red hair and blue eyes."  The client has some mementos from that time that she periodically pulls out.  She also has a baby blanket that would have been her daughters.  I encouraged the client to use these mementos as ways of continuing to process her grief and sadness.  The client seemed relieved to be able to do this.  I then used the bilateral stimulation hand paddles with the client.  She visualized herself on the beach with Jesus and her daughter.  It was very emotional for her.  She saw that her daughter had flowers in her hair, a ring of daisies.  Through the process, Jesus told her that she would be okay.  Her daughter told her that she was okay.  This was of a great relief to the client.  We discussed what she could do to memorialize this daughter?  I suggested that she plant daisies in her yard as a reminder.  The client has been afraid to forget this child but I pointed out that the daisies would be a constant reminder.  She agreed.  Her positive cognition at the end of the session was, "I am going to be okay."  Her subjective units of distress was less and 3.  Interventions: Mindfulness Meditation, Motivational Interviewing, Solution-Oriented/Positive Psychology, [facility] Desensitization and Reprocessing (EMDR) and Insight-Oriented  Diagnosis:   ICD-10-CM   1. PTSD (post-traumatic stress disorder)  F43.10     Plan: Plan at a memorial to her daughter, positive self talk, self-care, use mementos to help with her grief, self-care, radical acceptance.  Amy [name], [facility]

## 2020-04-13 ENCOUNTER — Ambulatory Visit: Payer: BC Managed Care – PPO | Admitting: Psychiatry

## 2020-04-21 ENCOUNTER — Ambulatory Visit: Payer: BC Managed Care – PPO | Admitting: Psychiatry

## 2020-04-22 ENCOUNTER — Ambulatory Visit: Payer: BC Managed Care – PPO | Admitting: Psychiatry

## 2020-04-27 ENCOUNTER — Ambulatory Visit: Payer: BC Managed Care – PPO | Attending: Internal Medicine

## 2020-04-27 ENCOUNTER — Other Ambulatory Visit: Payer: Self-pay

## 2020-04-27 DIAGNOSIS — Z20822 Contact with and (suspected) exposure to covid-19: Secondary | ICD-10-CM

## 2020-04-28 ENCOUNTER — Telehealth: Payer: Self-pay | Admitting: Internal Medicine

## 2020-04-28 LAB — SARS-COV-2, NAA 2 DAY TAT

## 2020-04-28 LAB — NOVEL CORONAVIRUS, NAA: SARS-CoV-2, NAA: NOT DETECTED

## 2020-04-28 NOTE — Telephone Encounter (Signed)
Negative COVID results given. Patient results "NOT Detected." Caller expressed understanding. ° °

## 2020-05-13 ENCOUNTER — Other Ambulatory Visit: Payer: Self-pay

## 2020-05-13 ENCOUNTER — Ambulatory Visit (INDEPENDENT_AMBULATORY_CARE_PROVIDER_SITE_OTHER): Payer: BC Managed Care – PPO | Admitting: Psychiatry

## 2020-05-13 ENCOUNTER — Encounter: Payer: Self-pay | Admitting: Psychiatry

## 2020-05-13 DIAGNOSIS — F411 Generalized anxiety disorder: Secondary | ICD-10-CM | POA: Diagnosis not present

## 2020-05-13 DIAGNOSIS — F4310 Post-traumatic stress disorder, unspecified: Secondary | ICD-10-CM

## 2020-05-13 DIAGNOSIS — F39 Unspecified mood [affective] disorder: Secondary | ICD-10-CM | POA: Diagnosis not present

## 2020-05-13 DIAGNOSIS — F4010 Social phobia, unspecified: Secondary | ICD-10-CM | POA: Diagnosis not present

## 2020-05-13 NOTE — Progress Notes (Signed)
RANDE DARIO [id number] 1966/09/14 54 y.o.  Subjective:   Patient ID:  LISMARY KIEHN is a 54 y.o. (DOB March 05, 1966) female.  Chief Complaint:  Chief Complaint  Patient presents with  . Anxiety  . Depression  . Insomnia  . Paranoid    HPI DORENE BRUNI presents to the office today for follow-up of anxiety, mood disturbance, and insomnia. She reports that there were 2 family deaths on the same day. She reports that she stopped taking her medications- "I don't know what I was thinking, but it was bad." She reports that she has been having severe nightmares about past traumatic experiences and is awakening crying. She reports frequent intrusive memories and flashbacks. Reports that mother's day was difficult due to losing her baby. Has some Prozac 20 mg remaining and started taking those a couple of weeks ago. She tried to increase to 40 mg and had a headache and went back to 20 mg. She reports that she has had severe anxiety. Having panic attacks a couple of times a week. She reports frequent worry and anxious thoughts. Has tried to go to church and that this has triggered increased anxiety and paranoia. She reports that her mood has been angry and irritable. She reports that mood has been persistently depressed. She reports mood lability. Denies any recent elevated moods. Denies impulsivity other than wanting to get a pedicure every couple of weeks since it relaxes her. She has been trying to lose weight due to diabetes. Typically is hungrier and wanting to snack in the evenings. She reports frequent awakenings during the night. Difficulty falling asleep. Difficulty with concentration. She reports occasional passive death wishes. Denies SI.   Reports that she has been experiencing paranoia. Hears her husband when he is not home. She reports frequently feeling that there is someone or a snake in their house.   She reports that she never started Equetro samples.   She reports that she has  been having difficulty with glucose levels. Had low glucose levels and then this changed to where glucose was elevated and causing sweats. Med was re-started for DM with some improvement. She reports that she notices sweating when they leave home. Will also feel short of breath at times and that heart is sometimes racing.   Sister was not able to take Prozac and was started on Lexapro.   Past Medication Trials: Abilify- Irritable, restless, nausea Vraylar- akathisia Latuda- Effective, then stopped working Lamotrigine  Prozac- Somewhat helpful for mood and anxiety. Trileptal Sertraline- Limited improvement in mood and anxiety s/s.  Prozac Propranolol- effective for akathisia. May have been helpful for anxiety. Buspar- "did not like the way it made me feel" Xanax Gabapentin- Adverse effects, drowsiness  PHQ2-9     Office Visit from 11/20/2018 in [facility]  PHQ-2 Total Score 0       Review of Systems:  Review of Systems  Constitutional: Positive for diaphoresis.  Gastrointestinal: Positive for nausea.  Musculoskeletal: Negative for gait problem.  Skin: Positive for rash.       She reports rash below her eye lids and physician thinks she may have psoriasis.   Neurological: Negative for tremors.  Psychiatric/Behavioral:       Please refer to HPI    Medications: I have reviewed the patient's current medications.  Current Outpatient Medications  Medication Sig Dispense Refill  . ALPRAZolam (XANAX) 0.5 MG tablet Take 0.5 mg by mouth 4 (four) times daily as needed for anxiety.     [name]  Cholecalciferol (D3-50 PO) Take by mouth.    . famotidine (PEPCID) 20 MG tablet Take 20 mg by mouth 2 (two) times daily as needed.     . loratadine (CLARITIN) 10 MG tablet Take 10 mg by mouth daily.    [name] losartan (COZAAR) 50 MG tablet Take 50 mg by mouth daily.    . metFORMIN (GLUCOPHAGE-XR) 500 MG 24 hr tablet Take 500 mg by mouth daily with breakfast. *May take additional tablet in the  evening for high blood glucose levels    . Carbamazepine (EQUETRO) 100 MG CP12 12 hr capsule Take 1 capsule at bedtime for 4 nights, then increase to 2 capsules at bedtime for 4 nights, then increase to 3 capsules at bedtime 60 capsule 0   No current facility-administered medications for this visit.    Medication Side Effects: None  Allergies:  Allergies  Allergen Reactions  . Hydromorphone Nausea And Vomiting  . Ciprofloxacin Rash    Past Medical History:  Diagnosis Date  . Anemia   . Anxiety    xanax prn  . Blood transfusion without reported diagnosis   . Degenerative disc disease   . Diabetes mellitus without complication ([place])   . Fatty liver   . Fibromyalgia   . GERD (gastroesophageal reflux disease)    prilosec otc-instructed to take dos  . Hot flashes 02/19/2014  . Hypertension    controlled on hctz-bp at pat 145/94  . Neuromuscular disorder ([place])    firbrmylagia  . Obesity   . PONV (postoperative nausea and vomiting)    pt states has not had ponv but has been treated in past with scopolamine, does admit to history of motion sickness, history of vertigo  . Vertigo    recent diagnosis of vertigo-uses otc meclizine  . Weight gain 02/19/2014    Family History  Problem Relation Age of Onset  . Cancer Paternal Uncle        liver   . Cancer Maternal Grandmother        throat  . Arthritis Mother   . Anxiety disorder Mother   . Depression Mother   . Diabetes Father   . Hypertension Father   . Arthritis Sister   . Diabetes Maternal Grandfather   . Hypertension Maternal Grandfather   . Diabetes Paternal Grandmother   . Hypertension Paternal Grandmother   . Anxiety disorder Paternal Grandmother   . Diabetes Paternal Grandfather   . Hypertension Paternal Grandfather   . Mood Disorder Brother   . Arthritis Sister   . Panic disorder Sister   . Psychosis Maternal Uncle   . Mood Disorder Maternal Uncle   . Anxiety disorder Paternal Uncle     Social History    Socioeconomic History  . Marital status: Married    Spouse name: Not on file  . Number of children: Not on file  . Years of education: Not on file  . Highest education level: Not on file  Occupational History  . Not on file  Tobacco Use  . Smoking status: Never Smoker  . Smokeless tobacco: Never Used  Substance and Sexual Activity  . Alcohol use: No  . Drug use: No  . Sexual activity: Yes    Partners: Male    Birth control/protection: Surgical    Comment: married  Other Topics Concern  . Not on file  Social History Narrative  . Not on file   Social Determinants of Health   Financial Resource Strain:   . Difficulty of Paying Living  Expenses:   Food Insecurity:   . Worried About Charity fundraiser in the Last Year:   . Arboriculturist in the Last Year:   Transportation Needs:   . Film/video editor (Medical):   [name] Lack of Transportation (Non-Medical):   Physical Activity:   . Days of Exercise per Week:   . Minutes of Exercise per Session:   Stress:   . Feeling of Stress :   Social Connections:   . Frequency of Communication with Friends and Family:   . Frequency of Social Gatherings with Friends and Family:   . Attends Religious Services:   . Active Member of Clubs or Organizations:   . Attends Archivist Meetings:   [name] Marital Status:   Intimate Partner Violence:   . Fear of Current or Ex-Partner:   . Emotionally Abused:   [name] Physically Abused:   . Sexually Abused:     Past Medical History, Surgical history, Social history, and Family history were reviewed and updated as appropriate.   Please see review of systems for further details on the patient's review from today.   Objective:   Physical Exam:  LMP 05/01/2011   Physical Exam Constitutional:      General: She is not in acute distress. Musculoskeletal:        General: No deformity.  Neurological:     Mental Status: She is alert and oriented to person, place, and time.      Coordination: Coordination normal.  Psychiatric:        Attention and Perception: Attention and perception normal. She does not perceive auditory or visual hallucinations.        Mood and Affect: Mood is anxious and depressed. Affect is labile and tearful. Affect is not blunt, angry or inappropriate.        Speech: Speech normal.        Behavior: Behavior normal. Behavior is cooperative.        Thought Content: Thought content is paranoid. Thought content is not delusional. Thought content does not include homicidal or suicidal ideation. Thought content does not include homicidal or suicidal plan.        Cognition and Memory: Cognition and memory normal.        Judgment: Judgment normal.     Comments: Insight intact     Lab Review:     Component Value Date/Time   NA 136 07/21/2019 1550   K 2.7 (LL) 07/21/2019 1550   CL 95 (L) 07/21/2019 1550   CO2 30 07/21/2019 1550   GLUCOSE 146 (H) 07/21/2019 1550   BUN 8 07/21/2019 1550   CREATININE 0.61 07/21/2019 1550   CREATININE 0.69 02/19/2014 1542   CALCIUM 9.2 07/21/2019 1550   PROT 7.5 07/21/2019 1550   ALBUMIN 4.0 07/21/2019 1550   AST 37 07/21/2019 1550   ALT 46 (H) 07/21/2019 1550   ALKPHOS 92 07/21/2019 1550   BILITOT 0.7 07/21/2019 1550   GFRNONAA >60 07/21/2019 1550   GFRAA >60 07/21/2019 1550       Component Value Date/Time   WBC 10.8 (H) 07/21/2019 1550   RBC 5.62 (H) 07/21/2019 1550   HGB 16.1 (H) 07/21/2019 1550   HCT 48.6 (H) 07/21/2019 1550   PLT 302 07/21/2019 1550   MCV 86.5 07/21/2019 1550   MCH 28.6 07/21/2019 1550   MCHC 33.1 07/21/2019 1550   RDW 13.6 07/21/2019 1550   LYMPHSABS 2.1 07/12/2016 2349   MONOABS 0.6 07/12/2016 2349   EOSABS  0.1 07/12/2016 2349   BASOSABS 0.0 07/12/2016 2349    No results found for: POCLITH, LITHIUM   No results found for: PHENYTOIN, PHENOBARB, VALPROATE, CBMZ   .res Assessment: Plan:   Reviewed potential benefits, risks, and side effects of Equetro and discussed how  to titrate dose.  Discussed starting with low dose and gradually increasing dose to improve tolerability.  Advised patient to contact office if she develops a rash. Will discontinue Prozac since patient reports that Prozac does not seem to be helpful and may be causing possible side effects. Patient to follow-up in 4 weeks or sooner if clinically indicated. Patient advised to contact office with any questions, adverse effects, or acute worsening in signs and symptoms.  Sherl was seen today for anxiety, depression, insomnia and paranoid.  Diagnoses and all orders for this visit:  PTSD (post-traumatic stress disorder)  Episodic mood disorder (HCC) -     Carbamazepine (EQUETRO) 100 MG CP12 12 hr capsule; Take 1 capsule at bedtime for 4 nights, then increase to 2 capsules at bedtime for 4 nights, then increase to 3 capsules at bedtime  Generalized anxiety disorder -     Carbamazepine (EQUETRO) 100 MG CP12 12 hr capsule; Take 1 capsule at bedtime for 4 nights, then increase to 2 capsules at bedtime for 4 nights, then increase to 3 capsules at bedtime  Social anxiety disorder     Please see After Visit Summary for patient specific instructions.  Future Appointments  Date Time Provider [place]  06/16/2020  8:00 AM Thayer Headings, PMHNP CP-CP None    No orders of the defined types were placed in this encounter.   -------------------------------

## 2020-05-13 NOTE — Patient Instructions (Addendum)
Equetro:  Take 1 capsule at bedtime for 4 nights, then increase to 2 capsules at bedtime for 4 nights, then increase to 3 capsules at bedtime  Call with any questions or side effects.

## 2020-05-14 MED ORDER — CARBAMAZEPINE ER 100 MG PO CP12: ORAL_CAPSULE | ORAL | 0 refills | Status: DC

## 2020-05-15 ENCOUNTER — Telehealth: Payer: Self-pay | Admitting: Psychiatry

## 2020-05-15 NOTE — Telephone Encounter (Signed)
After-hours phone call from patient who has had 2 doses of Equetro which husband fears is causing allergic reaction.  Husband has many allergies including having antihistamines on hand if she needs them.  She has a red spot on the right maxillary cheek under the right eye accentuating the lines of the skin as well as having swollen ankles and curious itch and pain on the medial side of the left ankle.  The symptoms are just starting and otherwise unexplained, patient having allergy to hydromorphone and Cipro with rash only to the Cipro.  She has no other systemic symptoms but early urticaria and angioedema are not otherwise possible to rule out.  She will stop the Equetro for at least 2 days in the course of symptom emergence with husband as support and witness to call the office in 2 days for next steps as she has tried many medications including recently without compliance then 2 days ago seen in the office for mood and anxiety symptoms including PTSD.  It is not possible to start another medication in the midst of possible evolving allergic reaction, and she understands my availability the rest of the weekend if for more severe symptoms occur.  Pepcid and Claritin may limit the  emergence of symptoms if taking and she has Xanax if needed for anxiety in the  interim.

## 2020-05-17 NOTE — Telephone Encounter (Signed)
LM with detailed information and to call back with side effects are resolved.

## 2020-05-23 ENCOUNTER — Emergency Department: Payer: BC Managed Care – PPO

## 2020-05-23 ENCOUNTER — Other Ambulatory Visit: Payer: Self-pay

## 2020-05-23 ENCOUNTER — Encounter: Payer: Self-pay | Admitting: Radiology

## 2020-05-23 DIAGNOSIS — R079 Chest pain, unspecified: Secondary | ICD-10-CM | POA: Insufficient documentation

## 2020-05-23 DIAGNOSIS — K219 Gastro-esophageal reflux disease without esophagitis: Secondary | ICD-10-CM | POA: Insufficient documentation

## 2020-05-23 DIAGNOSIS — Z7984 Long term (current) use of oral hypoglycemic drugs: Secondary | ICD-10-CM | POA: Insufficient documentation

## 2020-05-23 DIAGNOSIS — E119 Type 2 diabetes mellitus without complications: Secondary | ICD-10-CM | POA: Insufficient documentation

## 2020-05-23 DIAGNOSIS — R1110 Vomiting, unspecified: Secondary | ICD-10-CM | POA: Diagnosis not present

## 2020-05-23 DIAGNOSIS — Z79899 Other long term (current) drug therapy: Secondary | ICD-10-CM | POA: Diagnosis not present

## 2020-05-23 DIAGNOSIS — I10 Essential (primary) hypertension: Secondary | ICD-10-CM | POA: Insufficient documentation

## 2020-05-23 DIAGNOSIS — R1011 Right upper quadrant pain: Secondary | ICD-10-CM | POA: Insufficient documentation

## 2020-05-23 LAB — CBC
HCT: 43.6 % (ref 36.0–46.0)
Hemoglobin: 14.7 g/dL (ref 12.0–15.0)
MCH: 28.9 pg (ref 26.0–34.0)
MCHC: 33.7 g/dL (ref 30.0–36.0)
MCV: 85.7 fL (ref 80.0–100.0)
Platelets: 255 10*3/uL (ref 150–400)
RBC: 5.09 MIL/uL (ref 3.87–5.11)
RDW: 13.3 % (ref 11.5–15.5)
WBC: 10.1 10*3/uL (ref 4.0–10.5)
nRBC: 0 % (ref 0.0–0.2)

## 2020-05-23 LAB — BASIC METABOLIC PANEL
Anion gap: 10 (ref 5–15)
BUN: 9 mg/dL (ref 6–20)
CO2: 25 mmol/L (ref 22–32)
Calcium: 8.7 mg/dL — ABNORMAL LOW (ref 8.9–10.3)
Chloride: 104 mmol/L (ref 98–111)
Creatinine, Ser: 0.83 mg/dL (ref 0.44–1.00)
GFR calc Af Amer: >60 mL/min (ref >60)
GFR calc non Af Amer: >60 mL/min (ref >60)
Glucose, Bld: 164 mg/dL — ABNORMAL HIGH (ref 70–99)
Potassium: 3.7 mmol/L (ref 3.5–5.1)
Sodium: 139 mmol/L (ref 135–145)

## 2020-05-23 LAB — TROPONIN I (HIGH SENSITIVITY): Troponin I (High Sensitivity): 5 ng/L (ref <18)

## 2020-05-23 NOTE — ED Triage Notes (Signed)
Pt with chest pain, substernal that radiates to bilateral breasts since this am. Pt states she felt shob earlier today and did have some emesis. Pt very anxious. Pt with pwd skin, unlabored resps.

## 2020-05-24 ENCOUNTER — Emergency Department
Admission: EM | Admit: 2020-05-24 | Discharge: 2020-05-24 | Disposition: A | Discharge status: Home / Self Care | Payer: BC Managed Care – PPO | Attending: Emergency Medicine | Admitting: Emergency Medicine

## 2020-05-24 ENCOUNTER — Emergency Department: Payer: BC Managed Care – PPO

## 2020-05-24 DIAGNOSIS — K219 Gastro-esophageal reflux disease without esophagitis: Secondary | ICD-10-CM

## 2020-05-24 DIAGNOSIS — R079 Chest pain, unspecified: Secondary | ICD-10-CM

## 2020-05-24 LAB — HEPATIC FUNCTION PANEL
ALT: 34 U/L (ref 0–44)
AST: 30 U/L (ref 15–41)
Albumin: 3.6 g/dL (ref 3.5–5.0)
Alkaline Phosphatase: 89 U/L (ref 38–126)
Bilirubin, Direct: 0.1 mg/dL (ref 0.0–0.2)
Indirect Bilirubin: 0.8 mg/dL (ref 0.3–0.9)
Total Bilirubin: 0.9 mg/dL (ref 0.3–1.2)
Total Protein: 6.7 g/dL (ref 6.5–8.1)

## 2020-05-24 LAB — TROPONIN I (HIGH SENSITIVITY): Troponin I (High Sensitivity): 4 ng/L (ref <18)

## 2020-05-24 LAB — LIPASE, BLOOD: Lipase: 25 U/L (ref 11–51)

## 2020-05-24 MED ORDER — ONDANSETRON 4 MG PO TBDP: 4.0000 mg | ORAL_TABLET | Freq: Three times a day (TID) | ORAL | 0 refills | Status: DC | PRN

## 2020-05-24 MED ORDER — ALUM & MAG HYDROXIDE-SIMETH 400-400-40 MG/5ML PO SUSP
5.0000 mL | Freq: Four times a day (QID) | ORAL | 0 refills | Status: DC | PRN
Start: 2020-05-24 — End: 2020-05-27

## 2020-05-24 MED ORDER — PANTOPRAZOLE SODIUM 40 MG PO TBEC
40.0000 mg | DELAYED_RELEASE_TABLET | Freq: Every day | ORAL | 1 refills | Status: DC
Start: 2020-05-24 — End: 2021-01-25

## 2020-05-24 NOTE — ED Provider Notes (Signed)
[facility] Emergency Department Provider Note  ____________________________________________  Time seen: Approximately 5:08 AM  I have reviewed the triage vital signs and the nursing notes.   HISTORY  Chief Complaint Chest Pain   HPI Amy [name] is a 54 y.o. female with history of anemia, anxiety, diabetes, fibromyalgia, GERD, obesity who presents for evaluation of chest pain.  Patient reports that she has had burning central chest pain for few days.  This morning the symptoms were worse which prompted her to take some over-the-counter Prilosec.  She later went to have lunch with her family and had a grilled cheese and some soup.  After that she started feeling worse.  She had a couple episodes of nonbloody nonbilious emesis.  She started belching.  At that point she started to become anxious and concerned that it could be coming from her heart which prompted visit to the emergency room.  At this time her symptoms have fully resolved.  She denies any personal or family history of heart attacks, personal or family history of PE or DVT, no recent travel immobilization, no leg pain or swelling, no hemoptysis or exogenous hormones.  She is no longer having chest pain.  No constipation or diarrhea, no shortness of breath.   She denies alcohol or NSAID use.  No coffee-ground emesis, no melena  Past Medical History:  Diagnosis Date   Anemia    Anxiety    xanax prn   Blood transfusion without reported diagnosis    Degenerative disc disease    Diabetes mellitus without complication ([place])    Fatty liver    Fibromyalgia    GERD (gastroesophageal reflux disease)    prilosec otc-instructed to take dos   Hot flashes 02/19/2014   Hypertension    controlled on hctz-bp at pat 145/94   Neuromuscular disorder ([place])    firbrmylagia   Obesity    PONV (postoperative nausea and vomiting)    pt states has not had ponv but has been treated in past with  scopolamine, does admit to history of motion sickness, history of vertigo   Vertigo    recent diagnosis of vertigo-uses otc meclizine   Weight gain 02/19/2014    Patient Active Problem List   Diagnosis Date Noted   Insomnia 11/20/2018   Abscess of vagina 11/16/2018   Social phobia 08/24/2018   Panic disorder 08/24/2018   Night sweats 03/19/2017   Screening for colorectal cancer 03/19/2017   Rectocele 03/19/2017   Rectal irritation 03/19/2017   Hot flashes 02/19/2014   Weight gain 02/19/2014   Internal hemorrhoid 02/19/2014    Past Surgical History:  Procedure Laterality Date   ABDOMINAL HYSTERECTOMY     BREAST REDUCTION SURGERY     2010   ECTOPIC PREGNANCY SURGERY     2002   LAPAROSCOPIC ASSISTED VAGINAL HYSTERECTOMY  06/14/2011   Procedure: LAPAROSCOPIC ASSISTED VAGINAL HYSTERECTOMY;  Surgeon: Luz Lex, MD;  Location: [facility];  Service: Gynecology;  Laterality: N/A;  Abdomen and vagina   LAPAROSCOPY     2006-for adhesions    Prior to Admission medications   Medication Sig Start Date End Date Taking? Authorizing Provider  ALPRAZolam Duanne Moron) 0.5 MG tablet Take 0.5 mg by mouth 4 (four) times daily as needed for anxiety.     [provider]  alum & mag hydroxide-simeth (MAALOX MAX) 400-400-40 MG/5ML suspension Take 5 mLs by mouth every 6 (six) hours as needed for indigestion. 05/24/20   Rudene Re, MD  Carbamazepine (  EQUETRO) 100 MG CP12 12 hr capsule Take 1 capsule at bedtime for 4 nights, then increase to 2 capsules at bedtime for 4 nights, then increase to 3 capsules at bedtime 05/14/20   Thayer Headings, PMHNP  Cholecalciferol (D3-50 PO) Take by mouth.    [provider]  famotidine (PEPCID) 20 MG tablet Take 20 mg by mouth 2 (two) times daily as needed.     [provider]  loratadine (CLARITIN) 10 MG tablet Take 10 mg by mouth daily.    [provider]  losartan (COZAAR) 50 MG tablet Take 50 mg by mouth daily.     [provider]  metFORMIN (GLUCOPHAGE-XR) 500 MG 24 hr tablet Take 500 mg by mouth daily with breakfast. *May take additional tablet in the evening for high blood glucose levels 07/05/18   [provider]  ondansetron (ZOFRAN ODT) 4 MG disintegrating tablet Take 1 tablet (4 mg total) by mouth every 8 (eight) hours as needed. 05/24/20   Rudene Re, MD  pantoprazole (PROTONIX) 40 MG tablet Take 1 tablet (40 mg total) by mouth daily. 05/24/20 05/24/21  Rudene Re, MD    Allergies Hydromorphone and Ciprofloxacin  Family History  Problem Relation Age of Onset   Cancer Paternal Uncle        liver    Cancer Maternal Grandmother        throat   Arthritis Mother    Anxiety disorder Mother    Depression Mother    Diabetes Father    Hypertension Father    Arthritis Sister    Diabetes Maternal Grandfather    Hypertension Maternal Grandfather    Diabetes Paternal Grandmother    Hypertension Paternal Grandmother    Anxiety disorder Paternal Grandmother    Diabetes Paternal Grandfather    Hypertension Paternal Grandfather    Mood Disorder Brother    Arthritis Sister    Panic disorder Sister    Psychosis Maternal Uncle    Mood Disorder Maternal Uncle    Anxiety disorder Paternal Uncle     Social History Social History   Tobacco Use   Smoking status: Never Smoker   Smokeless tobacco: Never Used  Substance Use Topics   Alcohol use: No   Drug use: No    Review of Systems  Constitutional: Negative for fever. Eyes: Negative for visual changes. ENT: Negative for sore throat. Neck: No neck pain  Cardiovascular: + chest pain. Respiratory: Negative for shortness of breath. Gastrointestinal: Negative for abdominal pain, or diarrhea. + N/V Genitourinary: Negative for dysuria. Musculoskeletal: Negative for back pain. Skin: Negative for rash. Neurological: Negative for headaches, weakness or numbness. Psych: No SI or  HI  ____________________________________________   PHYSICAL EXAM:  VITAL SIGNS: Vitals:   05/24/20 0436 05/24/20 0513  BP: 118/78 121/70  Pulse: 81 80  Resp: 16 16  Temp:  97.9 F (36.6 C)  SpO2: 98% 97%    Constitutional: Alert and oriented. Well appearing and in no apparent distress. HEENT:      Head: Normocephalic and atraumatic.         Eyes: Conjunctivae are normal. Sclera is non-icteric.       Mouth/Throat: Mucous membranes are moist.       Neck: Supple with no signs of meningismus. Cardiovascular: Regular rate and rhythm. No murmurs, gallops, or rubs. 2+ symmetrical distal pulses are present in all extremities. No JVD. Respiratory: Normal respiratory effort. Lungs are clear to auscultation bilaterally. No wheezes, crackles, or rhonchi.  Gastrointestinal: Soft, non tender,  and non distended with positive bowel sounds. No rebound or guarding. Genitourinary: No CVA tenderness. Musculoskeletal: No edema, cyanosis, or erythema of extremities. Neurologic: Normal speech and language. Face is symmetric. Moving all extremities. No gross focal neurologic deficits are appreciated. Skin: Skin is warm, dry and intact. No rash noted. Psychiatric: Mood and affect are normal. Speech and behavior are normal.  ____________________________________________   LABS (all labs ordered are listed, but only abnormal results are displayed)  Labs Reviewed  BASIC METABOLIC PANEL - Abnormal; Notable for the following components:      Result Value   Glucose, Bld 164 (*)    Calcium 8.7 (*)    All other components within normal limits  CBC  HEPATIC FUNCTION PANEL  LIPASE, BLOOD  TROPONIN I (HIGH SENSITIVITY)  TROPONIN I (HIGH SENSITIVITY)   ____________________________________________  EKG  ED ECG REPORT I, Rudene Re, the attending physician, personally viewed and interpreted this ECG.  Sinus tachycardia, rate of 105, normal intervals, normal axis, anterior Q waves, no ST  elevations or depressions. Unchanged from prior. ____________________________________________  RADIOLOGY  I have personally reviewed the images performed during this visit and I agree with the Radiologist's read.   Interpretation by Radiologist:  DG Chest 2 View  Result Date: 05/23/2020 CLINICAL DATA:  54 year old female with chest pain. EXAM: CHEST - 2 VIEW COMPARISON:  Chest radiograph dated 07/21/2019. FINDINGS: The heart size and mediastinal contours are within normal limits. Both lungs are clear. The visualized skeletal structures are unremarkable. IMPRESSION: No active cardiopulmonary disease. Electronically Signed   By: Anner Crete M.D.   On: 05/23/2020 20:54   US Abdomen Limited  Result Date: 05/24/2020 CLINICAL DATA:  Right upper quadrant pain EXAM: ULTRASOUND ABDOMEN LIMITED RIGHT UPPER QUADRANT COMPARISON:  None. FINDINGS: Gallbladder: No gallstones or wall thickening visualized. No sonographic Murphy sign noted by sonographer. Common bile duct: Diameter: 2.8 mm Liver: Increased echotexture seen throughout. No focal abnormality or biliary ductal dilatation. Portal vein is patent on color Doppler imaging with normal direction of blood flow towards the liver. Other: None. IMPRESSION: Normal appearing gallbladder Hepatic steatosis Electronically Signed   By: Prudencio Pair M.D.   On: 05/24/2020 03:40     ____________________________________________   PROCEDURES  Procedure(s) performed:yes .1-3 Lead EKG Interpretation Performed by: Rudene Re, MD Authorized by: Rudene Re, MD     Interpretation: normal     ECG rate assessment: tachycardic     Rhythm: sinus tachycardia     Ectopy: none     Critical Care performed:  None ____________________________________________   INITIAL IMPRESSION / ASSESSMENT AND PLAN / ED COURSE  54 y.o. female with history of anemia, anxiety, diabetes, fibromyalgia, GERD, obesity who presents for evaluation of burning chest  pain, nausea, and vomiting.  Patient has been in the waiting room for over 9 hours and at this time feels that she is back to baseline.  No further episodes of vomiting.  Symptoms were persistent for several days and worse after she ate at a restaurant earlier today.  She describes it as burning in her chest associated with nausea and vomiting.  Her EKG shows no evidence of ischemia.  High-sensitivity troponin x2 -.  Right upper quadrant ultrasound showing normal gallbladder, confirmed by radiology.  Chest x-ray with no evidence of pneumothorax, pneumonia, aspiration, or edema, confirmed by radiology.  Labs showing no leukocytosis, normal lipase, normal LFTs, normal electrolytes.  Most likely GERD.  Will start patient on Protonix and will give a prescription for Zofran and  Maalox as needed.  Will refer to GI.  Discussed my standard return precautions.  History gathered from patient and her husband was at bedside.  Care discussed with both of them.  Old medical records reviewed.      _____________________________________________ Please note:  Patient was evaluated in Emergency Department today for the symptoms described in the history of present illness. Patient was evaluated in the context of the global COVID-19 pandemic, which necessitated consideration that the patient might be at risk for infection with the SARS-CoV-2 virus that causes COVID-19. Institutional protocols and algorithms that pertain to the evaluation of patients at risk for COVID-19 are in a state of rapid change based on information released by regulatory bodies including the CDC and federal and state organizations. These policies and algorithms were followed during the patient's care in the ED.  Some ED evaluations and interventions may be delayed as a result of limited staffing during the pandemic.   [place] Controlled Substance Database was reviewed by me. ____________________________________________   FINAL CLINICAL IMPRESSION(S) / ED  DIAGNOSES   Final diagnoses:  Chest pain, unspecified type  Gastroesophageal reflux disease, unspecified whether esophagitis present      NEW MEDICATIONS STARTED DURING THIS VISIT:  ED Discharge Orders         Ordered    alum & mag hydroxide-simeth (MAALOX MAX) 259-563-87 MG/5ML suspension  Every 6 hours PRN     Discontinue  Reprint     05/24/20 0508    pantoprazole (PROTONIX) 40 MG tablet  Daily     Discontinue  Reprint     05/24/20 0508    ondansetron (ZOFRAN ODT) 4 MG disintegrating tablet  Every 8 hours PRN     Discontinue  Reprint     05/24/20 5643           Note:  This document was prepared using Dragon voice recognition software and may include unintentional dictation errors.    Alfred Levins, [place], MD 05/24/20 [phone]

## 2020-05-24 NOTE — ED Notes (Signed)
Pt returned from ultrasound

## 2020-05-24 NOTE — ED Notes (Signed)
Pt states coming because "I think I am having gastric reflux." I keep belching and I have pain to the RUQ of the abdomen. Pt states on her way here, she thinks she had an anxiety attach, thinking she was having a heart attack and called 911. Pt states she has gained 100lbs as well. Pt resting in bed. Husband called by pt.

## 2020-05-24 NOTE — ED Notes (Signed)
Pt states she is having RUQ pain that radiates to her back.

## 2020-05-26 ENCOUNTER — Other Ambulatory Visit: Payer: Self-pay

## 2020-05-27 ENCOUNTER — Other Ambulatory Visit: Payer: Self-pay

## 2020-05-27 ENCOUNTER — Ambulatory Visit: Payer: BC Managed Care – PPO | Admitting: Gastroenterology

## 2020-05-27 ENCOUNTER — Encounter: Payer: Self-pay | Admitting: Gastroenterology

## 2020-05-27 VITALS — BP 138/92 | HR 102 | Temp 97.2°F | Ht 65.0 in | Wt 264.0 lb

## 2020-05-27 DIAGNOSIS — K760 Fatty (change of) liver, not elsewhere classified: Secondary | ICD-10-CM | POA: Diagnosis not present

## 2020-05-27 DIAGNOSIS — Z1211 Encounter for screening for malignant neoplasm of colon: Secondary | ICD-10-CM | POA: Diagnosis not present

## 2020-05-27 DIAGNOSIS — K219 Gastro-esophageal reflux disease without esophagitis: Secondary | ICD-10-CM

## 2020-05-27 DIAGNOSIS — R109 Unspecified abdominal pain: Secondary | ICD-10-CM

## 2020-05-27 MED ORDER — SUTAB 1479-225-188 MG PO TABS
ORAL_TABLET | ORAL | 0 refills | Status: DC
Start: 2020-05-27 — End: 2021-01-25

## 2020-05-27 NOTE — Patient Instructions (Signed)
Low-FODMAP Eating Plan  FODMAPs (fermentable oligosaccharides, disaccharides, monosaccharides, and polyols) are sugars that are hard for some people to digest. A low-FODMAP eating plan may help some people who have bowel (intestinal) diseases to manage their symptoms. This meal plan can be complicated to follow. Work with a diet and nutrition specialist (dietitian) to make a low-FODMAP eating plan that is right for you. A dietitian can make sure that you get enough nutrition from this diet. What are tips for following this plan? Reading food labels  Check labels for hidden FODMAPs such as: ? High-fructose syrup. ? Honey. ? Agave. ? Natural fruit flavors. ? Onion or garlic powder.  Choose low-FODMAP foods that contain 3-4 grams of fiber per serving.  Check food labels for serving sizes. Eat only one serving at a time to make sure FODMAP levels stay low. Meal planning  Follow a low-FODMAP eating plan for up to 6 weeks, or as told by your health care provider or dietitian.  To follow the eating plan: 1. Eliminate high-FODMAP foods from your diet completely. 2. Gradually reintroduce high-FODMAP foods into your diet one at a time. Most people should wait a few days after introducing one high-FODMAP food before they introduce the next high-FODMAP food. Your dietitian can recommend how quickly you may reintroduce foods. 3. Keep a daily record of what you eat and drink, and make note of any symptoms that you have after eating. 4. Review your daily record with a dietitian regularly. Your dietitian can help you identify which foods you can eat and which foods you should avoid. General tips  Drink enough fluid each day to keep your urine pale yellow.  Avoid processed foods. These often have added sugar and may be high in FODMAPs.  Avoid most dairy products, whole grains, and sweeteners.  Work with a dietitian to make sure you get enough fiber in your diet. Recommended  foods Grains  Gluten-free grains, such as rice, oats, buckwheat, quinoa, corn, polenta, and millet. Gluten-free pasta, bread, or cereal. Rice noodles. Corn tortillas. Vegetables  Eggplant, zucchini, cucumber, peppers, green beans, Brussels sprouts, bean sprouts, lettuce, arugula, kale, Swiss chard, spinach, collard greens, bok choy, summer squash, potato, and tomato. Limited amounts of corn, carrot, and sweet potato. Green parts of scallions. Fruits  Bananas, oranges, lemons, limes, blueberries, raspberries, strawberries, grapes, cantaloupe, honeydew melon, kiwi, papaya, passion fruit, and pineapple. Limited amounts of dried cranberries, banana chips, and shredded coconut. Dairy  Lactose-free milk, yogurt, and kefir. Lactose-free cottage cheese and ice cream. Non-dairy milks, such as almond, coconut, hemp, and rice milk. Yogurts made of non-dairy milks. Limited amounts of goat cheese, brie, mozzarella, parmesan, swiss, and other hard cheeses. Meats and other protein foods  Unseasoned beef, pork, poultry, or fish. Eggs. Berniece Salines. Tofu (firm) and tempeh. Limited amounts of nuts and seeds, such as almonds, walnuts, [place] nuts, pecans, peanuts, pumpkin seeds, chia seeds, and sunflower seeds. Fats and oils  Butter-free spreads. Vegetable oils, such as olive, canola, and sunflower oil. Seasoning and other foods  Artificial sweeteners with names that do not end in "ol" such as aspartame, saccharine, and stevia. Maple syrup, white table sugar, raw sugar, brown sugar, and molasses. Fresh basil, coriander, parsley, rosemary, and thyme. Beverages  Water and mineral water. Sugar-sweetened soft drinks. Small amounts of orange juice or cranberry juice. Black and green tea. Most dry wines. Coffee. This may not be a complete list of low-FODMAP foods. Talk with your dietitian for more information. Foods to avoid Grains  Wheat,  including kamut, durum, and semolina. Barley and bulgur. Couscous. Wheat-based  cereals. Wheat noodles, bread, crackers, and pastries. Vegetables  Chicory root, artichoke, asparagus, cabbage, snow peas, sugar snap peas, mushrooms, and cauliflower. Onions, garlic, leeks, and the white part of scallions. Fruits  Fresh, dried, and juiced forms of apple, pear, watermelon, peach, plum, cherries, apricots, blackberries, boysenberries, figs, nectarines, and mango. Avocado. Dairy  Milk, yogurt, ice cream, and soft cheese. Cream and sour cream. Milk-based sauces. Custard. Meats and other protein foods  Fried or fatty meat. Sausage. Cashews and pistachios. Soybeans, baked beans, black beans, chickpeas, kidney beans, fava beans, navy beans, lentils, and split peas. Seasoning and other foods  Any sugar-free gum or candy. Foods that contain artificial sweeteners such as sorbitol, mannitol, isomalt, or xylitol. Foods that contain honey, high-fructose corn syrup, or agave. Bouillon, vegetable stock, beef stock, and chicken stock. Garlic and onion powder. Condiments made with onion, such as hummus, chutney, pickles, relish, salad dressing, and salsa. Tomato paste. Beverages  Chicory-based drinks. Coffee substitutes. Chamomile tea. Fennel tea. Sweet or fortified wines such as port or sherry. Diet soft drinks made with isomalt, mannitol, maltitol, sorbitol, or xylitol. Apple, pear, and mango juice. Juices with high-fructose corn syrup. This may not be a complete list of high-FODMAP foods. Talk with your dietitian to discuss what dietary choices are best for you.  Summary  A low-FODMAP eating plan is a short-term diet that eliminates FODMAPs from your diet to help ease symptoms of certain bowel diseases.  The eating plan usually lasts up to 6 weeks. After that, high-FODMAP foods are restarted gradually, one at a time, so you can find out which may be causing symptoms.  A low-FODMAP eating plan can be complicated. It is best to work with a dietitian who has experience with this type of  plan. This information is not intended to replace advice given to you by your health care provider. Make sure you discuss any questions you have with your health care provider. Document Revised: 11/02/2017 Document Reviewed: 07/17/2017 Elsevier Patient Education  [place].

## 2020-05-27 NOTE — Addendum Note (Signed)
Addended by: Wayna Chalet on: 05/27/2020 04:11 PM   Modules accepted: Orders

## 2020-05-27 NOTE — Progress Notes (Signed)
Amy [name] [address]  [place]  [place], [place] [address]  Main: [phone]  Fax: [phone]   Gastroenterology Consultation  Referring Provider:     Redmond School, MD Primary Care Physician:  Redmond School, MD Reason for Consultation: Heartburn        HPI:    Chief Complaint  Patient presents with  . New Patient (Initial Visit)  . Gastroesophageal Reflux    Amy [name] is a 54 y.o. y/o female referred for consultation & management  by Dr. Redmond School, MD.  Patient reports history of anxiety and recently went to the ER as she experienced severe burning sensation in chest.  Before this, she was having intermittent episodes at home and was taking over-the-counter antacids and also took Prilosec which did not help.  However, this resolved in the ER.  She was given Protonix that she has been taking daily for the last 4 to 5 days and her symptoms have completely resolved.  No dysphagia.  No prior EGD or colonoscopy.  Does report intermittent bright red blood per rectum only when she is straining.  No family history of colon cancer.  Reports history of H. pylori that was treated in 2009.  Past Medical History:  Diagnosis Date  . Anemia   . Anxiety    xanax prn  . Blood transfusion without reported diagnosis   . Degenerative disc disease   . Diabetes mellitus without complication ([place])   . Fatty liver   . Fibromyalgia   . GERD (gastroesophageal reflux disease)    prilosec otc-instructed to take dos  . Hot flashes 02/19/2014  . Hypertension    controlled on hctz-bp at pat 145/94  . Neuromuscular disorder ([place])    firbrmylagia  . Obesity   . PONV (postoperative nausea and vomiting)    pt states has not had ponv but has been treated in past with scopolamine, does admit to history of motion sickness, history of vertigo  . Vertigo    recent diagnosis of vertigo-uses otc meclizine  . Weight gain 02/19/2014    Past Surgical History:  Procedure  Laterality Date  . ABDOMINAL HYSTERECTOMY    . BREAST REDUCTION SURGERY     2010  . ECTOPIC PREGNANCY SURGERY     2002  . LAPAROSCOPIC ASSISTED VAGINAL HYSTERECTOMY  06/14/2011   Procedure: LAPAROSCOPIC ASSISTED VAGINAL HYSTERECTOMY;  Surgeon: Amy Lex, MD;  Location: [facility];  Service: Gynecology;  Laterality: N/A;  Abdomen and vagina  . LAPAROSCOPY     2006-for adhesions    Prior to Admission medications   Medication Sig Start Date End Date Taking? Authorizing Provider  ALPRAZolam Duanne Moron) 0.5 MG tablet Take 0.5 mg by mouth 4 (four) times daily as needed for anxiety.    Yes [provider]  Cholecalciferol (D3-50 PO) Take by mouth.   Yes [provider]  famotidine (PEPCID) 20 MG tablet Take 20 mg by mouth 2 (two) times daily as needed.    Yes [provider]  FLUoxetine (PROZAC) 20 MG capsule Take 1 capsule by mouth daily. 04/26/20  Yes [provider]  HYDROcodone-acetaminophen (NORCO/VICODIN) 5-325 MG tablet Take 0.5-1 tablets by mouth every 4 (four) hours as needed. 04/12/20  Yes [provider]  hydrocortisone 2.5 % cream Apply 1 application topically 2 (two) times daily.   Yes [provider]  Lidocaine, Anorectal, (RECTICARE) 5 % CREA Apply topically.   Yes [provider]  loratadine (CLARITIN) 10 MG tablet Take  10 mg by mouth daily.   Yes [provider]  losartan (COZAAR) 50 MG tablet Take 50 mg by mouth daily.   Yes [provider]  loteprednol (LOTEMAX) 0.2 % SUSP 1 drop 4 (four) times daily.   Yes [provider]  metFORMIN (GLUCOPHAGE-XR) 500 MG 24 hr tablet Take 500 mg by mouth daily with breakfast. *May take additional tablet in the evening for high blood glucose levels 07/05/18  Yes [provider]  pantoprazole (PROTONIX) 40 MG tablet Take 1 tablet (40 mg total) by mouth daily. 05/24/20 05/24/21 Yes Rudene Re, MD    Family History  Problem Relation Age of Onset  .  Cancer Paternal Uncle        liver   . Cancer Maternal Grandmother        throat  . Arthritis Mother   . Anxiety disorder Mother   . Depression Mother   . Diabetes Father   . Hypertension Father   . Arthritis Sister   . Diabetes Maternal Grandfather   . Hypertension Maternal Grandfather   . Diabetes Paternal Grandmother   . Hypertension Paternal Grandmother   . Anxiety disorder Paternal Grandmother   . Diabetes Paternal Grandfather   . Hypertension Paternal Grandfather   . Mood Disorder Brother   . Arthritis Sister   . Panic disorder Sister   . Psychosis Maternal Uncle   . Mood Disorder Maternal Uncle   . Anxiety disorder Paternal Uncle      Social History   Tobacco Use  . Smoking status: Never Smoker  . Smokeless tobacco: Never Used  Substance Use Topics  . Alcohol use: No  . Drug use: No    Allergies as of 05/27/2020 - Review Complete 05/27/2020  Allergen Reaction Noted  . Hydromorphone Nausea And Vomiting 06/08/2011  . Ciprofloxacin Rash 06/08/2011    Review of Systems:    All systems reviewed and negative except where noted in HPI.   Physical Exam:  BP (!) 138/92   Pulse (!) 102   Temp (!) 97.2 F (36.2 C) (Oral)   Ht 5' 5"  (1.651 m)   Wt 264 lb (119.7 kg)   LMP 05/01/2011   BMI 43.93 kg/m  Patient's last menstrual period was 05/01/2011. Psych:  Alert and cooperative. Normal mood and affect. General:   Alert,  Well-developed, well-nourished, pleasant and cooperative in NAD Head:  Normocephalic and atraumatic. Eyes:  Sclera clear, no icterus.   Conjunctiva pink. Ears:  Normal auditory acuity. Nose:  No deformity, discharge, or lesions. Mouth:  No deformity or lesions,oropharynx pink & moist. Neck:  Supple; no masses or thyromegaly. Abdomen:  Normal bowel sounds.  No bruits.  Soft, non-tender and non-distended without masses, hepatosplenomegaly or hernias noted.  No guarding or rebound tenderness.    Msk:  Symmetrical without gross deformities. Good,  equal movement & strength bilaterally. Pulses:  Normal pulses noted. Extremities:  No clubbing or edema.  No cyanosis. Neurologic:  Alert and oriented x3;  grossly normal neurologically. Skin:  Intact without significant lesions or rashes. No jaundice. Lymph Nodes:  No significant cervical adenopathy. Psych:  Alert and cooperative. Normal mood and affect.   Labs: CBC    Component Value Date/Time   WBC 10.1 05/23/2020 2027   RBC 5.09 05/23/2020 2027   HGB 14.7 05/23/2020 2027   HCT 43.6 05/23/2020 2027   PLT 255 05/23/2020 2027   MCV 85.7 05/23/2020 2027   MCH 28.9 05/23/2020 2027   MCHC 33.7 05/23/2020 2027  RDW 13.3 05/23/2020 2027   LYMPHSABS 2.1 07/12/2016 2349   MONOABS 0.6 07/12/2016 2349   EOSABS 0.1 07/12/2016 2349   BASOSABS 0.0 07/12/2016 2349   CMP     Component Value Date/Time   NA 139 05/23/2020 2027   K 3.7 05/23/2020 2027   CL 104 05/23/2020 2027   CO2 25 05/23/2020 2027   GLUCOSE 164 (H) 05/23/2020 2027   BUN 9 05/23/2020 2027   CREATININE 0.83 05/23/2020 2027   CREATININE 0.69 02/19/2014 1542   CALCIUM 8.7 (L) 05/23/2020 2027   PROT 6.7 05/24/2020 0221   ALBUMIN 3.6 05/24/2020 0221   AST 30 05/24/2020 0221   ALT 34 05/24/2020 0221   ALKPHOS 89 05/24/2020 0221   BILITOT 0.9 05/24/2020 0221   GFRNONAA >60 05/23/2020 2027   GFRAA >60 05/23/2020 2027    Imaging Studies: DG Chest 2 View  Result Date: 05/23/2020 CLINICAL DATA:  54 year old female with chest pain. EXAM: CHEST - 2 VIEW COMPARISON:  Chest radiograph dated 07/21/2019. FINDINGS: The heart size and mediastinal contours are within normal limits. Both lungs are clear. The visualized skeletal structures are unremarkable. IMPRESSION: No active cardiopulmonary disease. Electronically Signed   By: Anner Crete M.D.   On: 05/23/2020 20:54   US Abdomen Limited  Result Date: 05/24/2020 CLINICAL DATA:  Right upper quadrant pain EXAM: ULTRASOUND ABDOMEN LIMITED RIGHT UPPER QUADRANT COMPARISON:   None. FINDINGS: Gallbladder: No gallstones or wall thickening visualized. No sonographic Murphy sign noted by sonographer. Common bile duct: Diameter: 2.8 mm Liver: Increased echotexture seen throughout. No focal abnormality or biliary ductal dilatation. Portal vein is patent on color Doppler imaging with normal direction of blood flow towards the liver. Other: None. IMPRESSION: Normal appearing gallbladder Hepatic steatosis Electronically Signed   By: Prudencio Pair M.D.   On: 05/24/2020 03:40    Assessment and Plan:   MONEE DEMBECK is a 54 y.o. y/o female has been referred for heartburn  Patient educated extensively on acid reflux lifestyle modification, including buying a bed wedge, not eating 3 hrs before bedtime, diet modifications, and handout given for the same.   Patient on Protonix once daily which is controlling her heartburn well.  However, she reports history of H. pylori and also right upper quadrant pain With normal gallbladder on ultrasound.  Patient would like further evaluation with endoscopy for her ongoing symptoms  Alternative options of conservative management were discussed in detail, including but not limited to medication management, foregoing endoscopic procedures at this time and others.    (Risks of PPI use were discussed with patient including bone loss, C. Diff diarrhea, pneumonia, infections, CKD, electrolyte abnormalities.  Pt. Verbalizes understanding and chooses to continue the medication.)  Discontinue Protonix after 1 to 2 months and if symptoms return, consider EGD  Colonoscopy indicated for screening and intermittent blood per rectum to evaluate for hemorrhoids  I have discussed alternative options, risks & benefits,  which include, but are not limited to, bleeding, infection, perforation,respiratory complication & drug reaction.  The patient agrees with this plan & written consent will be obtained.    Also reports bloating with vegetables.  Low FODMAP diet  discussed   Dr Amy [name]  Speech recognition software was used to dictate the above note.

## 2020-06-01 ENCOUNTER — Other Ambulatory Visit: Payer: Self-pay

## 2020-06-01 ENCOUNTER — Telehealth: Payer: Self-pay

## 2020-06-01 ENCOUNTER — Ambulatory Visit
Admission: RE | Admit: 2020-06-01 | Discharge: 2020-06-01 | Disposition: A | Discharge status: Home / Self Care | Payer: BC Managed Care – PPO | Source: Ambulatory Visit | Attending: Gastroenterology | Admitting: Gastroenterology

## 2020-06-01 DIAGNOSIS — R1012 Left upper quadrant pain: Secondary | ICD-10-CM | POA: Insufficient documentation

## 2020-06-01 DIAGNOSIS — R1011 Right upper quadrant pain: Secondary | ICD-10-CM

## 2020-06-01 LAB — HEPATITIS C ANTIBODY (REFLEX): HCV Ab: <0.1 s/co ratio (ref 0.0–0.9)

## 2020-06-01 LAB — MITOCHONDRIAL/SMOOTH MUSCLE AB PNL
Mitochondrial Ab: <20 Units (ref 0.0–20.0)
Smooth Muscle Ab: 9 Units (ref 0–19)

## 2020-06-01 LAB — IGG: IgG (Immunoglobin G), Serum: 869 mg/dL (ref 586–1602)

## 2020-06-01 LAB — HEPATITIS B SURFACE ANTIGEN: Hepatitis B Surface Ag: NEGATIVE

## 2020-06-01 LAB — HCV COMMENT:

## 2020-06-01 LAB — HEPATITIS B CORE ANTIBODY, TOTAL: Hep B Core Total Ab: NEGATIVE

## 2020-06-01 LAB — CERULOPLASMIN: Ceruloplasmin: 30.9 mg/dL (ref 19.0–39.0)

## 2020-06-01 LAB — HEPATITIS B SURFACE ANTIBODY,QUALITATIVE: Hep B Surface Ab, Qual: NONREACTIVE

## 2020-06-01 LAB — ANA: ANA Titer 1: NEGATIVE

## 2020-06-01 LAB — FERRITIN: Ferritin: 152 ng/mL — ABNORMAL HIGH (ref 15–150)

## 2020-06-01 LAB — ANTI-MICROSOMAL ANTIBODY LIVER / KIDNEY: LKM1 Ab: 0.6 Units (ref 0.0–20.0)

## 2020-06-01 LAB — HEPATITIS A ANTIBODY, TOTAL: hep A Total Ab: NEGATIVE

## 2020-06-01 MED ORDER — IOHEXOL 300 MG/ML  SOLN
100.0000 mL | Freq: Once | INTRAMUSCULAR | Status: AC | PRN
  Administered 2020-06-01: 100 mL via INTRAVENOUS

## 2020-06-01 NOTE — Telephone Encounter (Signed)
Dr. Bonna Gains called patient to give her CT Scan results and recommendations.

## 2020-06-01 NOTE — Telephone Encounter (Signed)
I am on the phone with her right now and she states that her pain is back. I will go ahead and set her the CT Scan and STAT labs too. Thank you so much! Per Dr. Bonna Gains   I will call patient to let her know what Dr. Michele Mcalpine recommendations were.  Called Patient to lt her know that she could go to the [facility] to have her CT Scan done. Patient stated that she will go right ahead and have it done. Then patient will come here to our office and have her blood drawn.

## 2020-06-01 NOTE — Telephone Encounter (Signed)
Patient called stating that yesterday she was having some RUQ pain (underneath her breast) lasting about 5 minutes, sweaty and nauseated and then it went away. Then later on the day she had LUQ pain lasting about 5 minutes as well. Patient denied vomiting, fever, chills, diarrhea or constipation. Patient is scheduled to have her colonoscopy done next week on Thursday June 10, 2020. Today patient had not had any symptoms. However, she would like to know if she should be concerned since she had felt better since yesterday's episodes. Please advise.

## 2020-06-02 ENCOUNTER — Ambulatory Visit: Admission: RE | Admit: 2020-06-02 | Payer: BC Managed Care – PPO | Source: Ambulatory Visit

## 2020-06-03 ENCOUNTER — Other Ambulatory Visit
Admission: RE | Admit: 2020-06-03 | Discharge: 2020-06-03 | Disposition: A | Discharge status: Home / Self Care | Payer: BC Managed Care – PPO | Attending: Gastroenterology | Admitting: Gastroenterology

## 2020-06-03 DIAGNOSIS — R1012 Left upper quadrant pain: Secondary | ICD-10-CM | POA: Insufficient documentation

## 2020-06-03 DIAGNOSIS — R1011 Right upper quadrant pain: Secondary | ICD-10-CM | POA: Insufficient documentation

## 2020-06-03 LAB — COMPREHENSIVE METABOLIC PANEL
ALT: 37 U/L (ref 0–44)
AST: 36 U/L (ref 15–41)
Albumin: 4 g/dL (ref 3.5–5.0)
Alkaline Phosphatase: 112 U/L (ref 38–126)
Anion gap: 8 (ref 5–15)
BUN: 8 mg/dL (ref 6–20)
CO2: 27 mmol/L (ref 22–32)
Calcium: 9 mg/dL (ref 8.9–10.3)
Chloride: 102 mmol/L (ref 98–111)
Creatinine, Ser: 0.77 mg/dL (ref 0.44–1.00)
GFR calc Af Amer: >60 mL/min (ref >60)
GFR calc non Af Amer: >60 mL/min (ref >60)
Glucose, Bld: 159 mg/dL — ABNORMAL HIGH (ref 70–99)
Potassium: 4.2 mmol/L (ref 3.5–5.1)
Sodium: 137 mmol/L (ref 135–145)
Total Bilirubin: 0.7 mg/dL (ref 0.3–1.2)
Total Protein: 7.3 g/dL (ref 6.5–8.1)

## 2020-06-08 ENCOUNTER — Other Ambulatory Visit: Payer: Self-pay

## 2020-06-08 ENCOUNTER — Other Ambulatory Visit
Admission: RE | Admit: 2020-06-08 | Discharge: 2020-06-08 | Disposition: A | Discharge status: Home / Self Care | Payer: BC Managed Care – PPO | Source: Ambulatory Visit | Attending: Gastroenterology | Admitting: Gastroenterology

## 2020-06-08 ENCOUNTER — Telehealth: Payer: Self-pay

## 2020-06-08 DIAGNOSIS — Z20822 Contact with and (suspected) exposure to covid-19: Secondary | ICD-10-CM | POA: Insufficient documentation

## 2020-06-08 DIAGNOSIS — Z01812 Encounter for preprocedural laboratory examination: Secondary | ICD-10-CM | POA: Diagnosis not present

## 2020-06-08 LAB — SARS CORONAVIRUS 2 (TAT 6-24 HRS): SARS Coronavirus 2: NEGATIVE

## 2020-06-08 NOTE — Telephone Encounter (Signed)
Patient stated that over the weekend she had felt that she gets nauseas, some chest pain, cough, wheezing, vomiting and RUQ pain. Patient stated that it could be sinus drainage or from her acid reflux. I recommended for her to go and see her PCP so they could check her since she has her procedures scheduled for this Thursday 06/10/2020. Patient stated that she would give him a call and try to be checked. I told her to let me know.

## 2020-06-08 NOTE — Telephone Encounter (Signed)
Patient called again to let us know that she had called her PCP and they told her that she could come in but that it would take about 3 hours for her to be seen as a walk-in. Patient stated that this was not her first time that has happened and that her PCP had told her that it was due to her acid reflux. Therefore, stated that she would go and have her COVID-19 test done today and try to wait until this Thursday to have her procedures done. I told her to give [place] a call if she had any further questions. Dr. Bonna Gains, this is just FYI.

## 2020-06-10 ENCOUNTER — Encounter: Admission: RE | Payer: Self-pay | Source: Home / Self Care

## 2020-06-10 ENCOUNTER — Telehealth: Payer: Self-pay

## 2020-06-10 ENCOUNTER — Encounter: Payer: Self-pay | Admitting: Anesthesiology

## 2020-06-10 ENCOUNTER — Ambulatory Visit
Admission: RE | Admit: 2020-06-10 | Payer: BC Managed Care – PPO | Source: Home / Self Care | Admitting: Gastroenterology

## 2020-06-10 SURGERY — ESOPHAGOGASTRODUODENOSCOPY (EGD) WITH PROPOFOL
Anesthesia: General

## 2020-06-10 MED ORDER — LIDOCAINE HCL (PF) 2 % IJ SOLN
INTRAMUSCULAR | Status: AC
  Filled 2020-06-10: qty 5

## 2020-06-10 MED ORDER — PROPOFOL 500 MG/50ML IV EMUL
INTRAVENOUS | Status: AC
  Filled 2020-06-10: qty 100

## 2020-06-10 NOTE — Telephone Encounter (Signed)
This pt cancelled her procedure too She was throwing up Can you call endo unit and tell them both my patients are cancelled for July 8th please. Thanks-per Dr. Michele Mcalpine secure chat from Wednesday 06/09/2020 at 7:50 PM.  I called Trish from Endo unit this AM to let her know about this patient not being able to have her colonoscopy done the AM. I will call her back to let her know when patient rescheduled.

## 2020-06-10 NOTE — Telephone Encounter (Signed)
Patient stated that she was not sure if she wants to try again this soon. She stated that she knows that she needs to do the colonoscopy because she is still having the abdominal pain, nausea and vomiting. However, she wanted to know if there is any way that she could do a virtual colonoscopy instead. This message was sent to the physician. Waiting on her recommendation.

## 2020-06-16 ENCOUNTER — Other Ambulatory Visit: Payer: Self-pay

## 2020-06-16 ENCOUNTER — Ambulatory Visit (INDEPENDENT_AMBULATORY_CARE_PROVIDER_SITE_OTHER): Payer: BC Managed Care – PPO | Admitting: Psychiatry

## 2020-06-16 ENCOUNTER — Encounter: Payer: Self-pay | Admitting: Psychiatry

## 2020-06-16 DIAGNOSIS — F39 Unspecified mood [affective] disorder: Secondary | ICD-10-CM

## 2020-06-16 DIAGNOSIS — F4310 Post-traumatic stress disorder, unspecified: Secondary | ICD-10-CM | POA: Diagnosis not present

## 2020-06-16 DIAGNOSIS — G4700 Insomnia, unspecified: Secondary | ICD-10-CM | POA: Diagnosis not present

## 2020-06-16 MED ORDER — QUETIAPINE FUMARATE 100 MG PO TABS: ORAL_TABLET | ORAL | 1 refills | Status: DC

## 2020-06-16 NOTE — Progress Notes (Signed)
Amy [name] [id number] [birth year] 54 y.o.  Subjective:   Patient ID:  Amy [name] is a 54 y.o. (DOB [birth year]) female.  Chief Complaint:  Chief Complaint  Patient presents with  . Anxiety  . Depression  . Paranoid  . Insomnia    HPI Amy [name] presents to the office today for follow-up of anxiety, depression, mood disturbance, paranoia, and insomnia. She had adverse reaction to Equetro to include LE edema, eye swelling, diaphoresis, rash, itching .  Pt also noticed that she was excessively talkative while taking Equetro.   Had a severe panic attack with chest pain, difficulty breathing, abdominal pain, burning sensation in chest, and n/v. Went to ER by ambulance for panic attack. Was referred to GI to r/o GI cause of recent s/s. Has continued to have frequent panic attacks.   Has had worsening depression since mother's day and anniversary of loss of baby and near loss of her own life. She reports that she has had memories of these events. She reports that she has been crying frequently. Withdrawing from others. Has had some recent irritability.   She reports that she re-started Sertraline several weeks on her own and also started Wellbutrin XL from a family member. She reports that Wellbutrin XL made her more talkative. Reports that she took these medications for about 2-3 weeks.    Recently heard "Amazing Shirlee [place]" being hummed when no one else heard this. Continues to think husband is in the house when he is not or that someone is at her door when no one is there. She reports paranoia.   Reports poor sleep and states she slept 2 hours last night. Reports sleeping about 4 hours most nights and at most 5-6 hours a night. Appetite fluctuates from not eating to wanting to eat continuously. She reports poor concentration and memory. Reports low energy.  Denies SI.   Past Medication Trials: Abilify- Irritable, restless, nausea Vraylar- akathisia Latuda- Effective, then stopped  working Lamotrigine  Carbamazepine- SJS Prozac- Somewhat helpful for mood and anxiety. Trileptal Sertraline- Limited improvement in mood and anxiety s/s.  Prozac Propranolol- effective for akathisia. May have been helpful for anxiety. Buspar- "did not like the way it made me feel" Xanax Gabapentin- Adverse effects, drowsiness  PHQ2-9     Office Visit from 11/20/2018 in [facility]  PHQ-2 Total Score 0       Review of Systems:  Review of Systems  Eyes:       Saw opthamologist and was presribed steroid eye drops.   Cardiovascular:       Has chest pain with panic s/s.   Gastrointestinal: Negative.   Musculoskeletal: Negative for gait problem.  Skin:       Has been dx'd with rosacea  Psychiatric/Behavioral:       Please refer to HPI    Medications: I have reviewed the patient's current medications.  Current Outpatient Medications  Medication Sig Dispense Refill  . ALPRAZolam (XANAX) 0.5 MG tablet Take 0.5 mg by mouth 4 (four) times daily as needed for anxiety.     [name] loratadine (CLARITIN) 10 MG tablet Take 10 mg by mouth daily.    [name] losartan (COZAAR) 50 MG tablet Take 50 mg by mouth daily.    . metFORMIN (GLUCOPHAGE-XR) 500 MG 24 hr tablet Take 500 mg by mouth daily with breakfast. *May take additional tablet in the evening for high blood glucose levels    . metroNIDAZOLE (METROGEL) 0.75 % gel Apply topically 2 (two)  times daily.    . pantoprazole (PROTONIX) 40 MG tablet Take 1 tablet (40 mg total) by mouth daily. 30 tablet 1  . HYDROcodone-acetaminophen (NORCO/VICODIN) 5-325 MG tablet Take 0.5-1 tablets by mouth every 4 (four) hours as needed.    . hydrocortisone 2.5 % cream Apply 1 application topically 2 (two) times daily.    . Lidocaine, Anorectal, (RECTICARE) 5 % CREA Apply topically.    [name] loteprednol (LOTEMAX) 0.2 % SUSP 1 drop 4 (four) times daily.    [name] loteprednol (LOTEMAX) 0.5 % ophthalmic suspension 1 DROP 4 TIMES A DAY INTO BOTH EYES    . QUEtiapine  (SEROQUEL) 100 MG tablet Take 1/2 tab po QHS x 2-3 days, then increase to 1 tab po QHS x 2 days, then increase to 2 tabs po QHS x 2 days, then increase to 3 tabs po QHS 60 tablet 1  . Sodium Sulfate-Mag Sulfate-KCl (SUTAB) [phone] MG TABS At 5 PM take 12 tablets using the 8 oz cup provided in the kit drinking 5 cups of water and 5 hours before your procedure repeat the same process. (Patient not taking: Reported on 06/16/2020) 24 tablet 0   No current facility-administered medications for this visit.    Medication Side Effects: Other: Adverse reaction with Carbamazepine  Allergies:  Allergies  Allergen Reactions  . Carbamazepine   . Hydromorphone Nausea And Vomiting  . Ciprofloxacin Rash    Past Medical History:  Diagnosis Date  . Anemia   . Anxiety    xanax prn  . Blood transfusion without reported diagnosis   . Degenerative disc disease   . Diabetes mellitus without complication ([place])   . Fatty liver   . Fibromyalgia   . GERD (gastroesophageal reflux disease)    prilosec otc-instructed to take dos  . Hot flashes 02/19/2014  . Hypertension    controlled on hctz-bp at pat 145/94  . Neuromuscular disorder ([place])    firbrmylagia  . Obesity   . PONV (postoperative nausea and vomiting)    pt states has not had ponv but has been treated in past with scopolamine, does admit to history of motion sickness, history of vertigo  . Vertigo    recent diagnosis of vertigo-uses otc meclizine  . Weight gain 02/19/2014    Family History  Problem Relation Age of Onset  . Cancer Paternal Uncle        liver   . Cancer Maternal Grandmother        throat  . Arthritis Mother   . Anxiety disorder Mother   . Depression Mother   . Diabetes Father   . Hypertension Father   . Arthritis Sister   . Diabetes Maternal Grandfather   . Hypertension Maternal Grandfather   . Diabetes Paternal Grandmother   . Hypertension Paternal Grandmother   . Anxiety disorder Paternal Grandmother   .  Diabetes Paternal Grandfather   . Hypertension Paternal Grandfather   . Mood Disorder Brother   . Arthritis Sister   . Panic disorder Sister   . Psychosis Maternal Uncle   . Mood Disorder Maternal Uncle   . Anxiety disorder Paternal Uncle     Social History   Socioeconomic History  . Marital status: Married    Spouse name: Not on file  . Number of children: Not on file  . Years of education: Not on file  . Highest education level: Not on file  Occupational History  . Not on file  Tobacco Use  . Smoking status: Never Smoker  .  Smokeless tobacco: Never Used  Substance and Sexual Activity  . Alcohol use: No  . Drug use: No  . Sexual activity: Yes    Partners: Male    Birth control/protection: Surgical    Comment: married  Other Topics Concern  . Not on file  Social History Narrative  . Not on file   Social Determinants of Health   Financial Resource Strain:   . Difficulty of Paying Living Expenses:   Food Insecurity:   . Worried About Charity fundraiser in the Last Year:   . Arboriculturist in the Last Year:   Transportation Needs:   . Film/video editor (Medical):   [name] Lack of Transportation (Non-Medical):   Physical Activity:   . Days of Exercise per Week:   . Minutes of Exercise per Session:   Stress:   . Feeling of Stress :   Social Connections:   . Frequency of Communication with Friends and Family:   . Frequency of Social Gatherings with Friends and Family:   . Attends Religious Services:   . Active Member of Clubs or Organizations:   . Attends Archivist Meetings:   [name] Marital Status:   Intimate Partner Violence:   . Fear of Current or Ex-Partner:   . Emotionally Abused:   [name] Physically Abused:   . Sexually Abused:     Past Medical History, Surgical history, Social history, and Family history were reviewed and updated as appropriate.   Please see review of systems for further details on the patient's review from today.   Objective:    Physical Exam:  LMP 05/01/2011   Physical Exam Constitutional:      General: She is not in acute distress. Musculoskeletal:        General: No deformity.  Neurological:     Mental Status: She is alert and oriented to person, place, and time.     Coordination: Coordination normal.  Psychiatric:        Attention and Perception: Attention and perception normal. She does not perceive auditory or visual hallucinations.        Mood and Affect: Mood is anxious and depressed. Affect is tearful. Affect is not labile, blunt, angry or inappropriate.        Speech: Speech normal.        Behavior: Behavior normal.        Thought Content: Thought content is paranoid. Thought content is not delusional. Thought content does not include homicidal or suicidal ideation. Thought content does not include homicidal or suicidal plan.        Cognition and Memory: Cognition and memory normal.        Judgment: Judgment normal.     Comments: Insight intact     Lab Review:     Component Value Date/Time   NA 137 06/03/2020 1117   K 4.2 06/03/2020 1117   CL 102 06/03/2020 1117   CO2 27 06/03/2020 1117   GLUCOSE 159 (H) 06/03/2020 1117   BUN 8 06/03/2020 1117   CREATININE 0.77 06/03/2020 1117   CREATININE 0.69 02/19/2014 1542   CALCIUM 9.0 06/03/2020 1117   PROT 7.3 06/03/2020 1117   ALBUMIN 4.0 06/03/2020 1117   AST 36 06/03/2020 1117   ALT 37 06/03/2020 1117   ALKPHOS 112 06/03/2020 1117   BILITOT 0.7 06/03/2020 1117   GFRNONAA >60 06/03/2020 1117   GFRAA >60 06/03/2020 1117       Component Value Date/Time   WBC 10.1  05/23/2020 2027   RBC 5.09 05/23/2020 2027   HGB 14.7 05/23/2020 2027   HCT 43.6 05/23/2020 2027   PLT 255 05/23/2020 2027   MCV 85.7 05/23/2020 2027   MCH 28.9 05/23/2020 2027   MCHC 33.7 05/23/2020 2027   RDW 13.3 05/23/2020 2027   LYMPHSABS 2.1 07/12/2016 2349   MONOABS 0.6 07/12/2016 2349   EOSABS 0.1 07/12/2016 2349   BASOSABS 0.0 07/12/2016 2349    No results  found for: POCLITH, LITHIUM   No results found for: PHENYTOIN, PHENOBARB, VALPROATE, CBMZ   .res Assessment: Plan:   Discussed adding Carbamazepine to allergy list due to adverse reaction. Discussed potential benefits, risks, and side effects of Seroquel to include potential metabolic side effects. Discussed that Seroquel is indicated for Bipolar D/O and Psychosis and is also used off-label for insomnia and PTSD, and therefore may be helpful for several of her s/s. Pt agrees to trial of Seroquel. Advised pt to continue to monitor glucose levels regularly and to contact office if there is a significant increase in glucose levels.  Will start Seroquel 50 mg po QHS for 2-3 days, then increase to 100 mg po QHS for 2 days, then increase to 200 mg po QHS for 2 days, then increase to 300 mg po QHS. Pt to f/u in 4 weeks or sooner if clinically indicated.  Patient advised to contact office with any questions, adverse effects, or acute worsening in signs and symptoms.  Amy [name] was seen today for anxiety, depression, paranoid and insomnia.  Diagnoses and all orders for this visit:  PTSD (post-traumatic stress disorder) -     QUEtiapine (SEROQUEL) 100 MG tablet; Take 1/2 tab po QHS x 2-3 days, then increase to 1 tab po QHS x 2 days, then increase to 2 tabs po QHS x 2 days, then increase to 3 tabs po QHS  Episodic mood disorder (HCC) -     QUEtiapine (SEROQUEL) 100 MG tablet; Take 1/2 tab po QHS x 2-3 days, then increase to 1 tab po QHS x 2 days, then increase to 2 tabs po QHS x 2 days, then increase to 3 tabs po QHS  Insomnia, unspecified type -     QUEtiapine (SEROQUEL) 100 MG tablet; Take 1/2 tab po QHS x 2-3 days, then increase to 1 tab po QHS x 2 days, then increase to 2 tabs po QHS x 2 days, then increase to 3 tabs po QHS     Please see After Visit Summary for patient specific instructions.  Future Appointments  Date Time Provider [place]  07/01/2020 10:30 AM Estill Dooms, NP  [facility]  07/14/2020  8:00 AM Thayer Headings, PMHNP [facility] None  08/25/2020  2:00 PM Virgel Manifold, MD [facility] None    No orders of the defined types were placed in this encounter.   -------------------------------

## 2020-07-01 ENCOUNTER — Other Ambulatory Visit: Payer: BC Managed Care – PPO | Admitting: Adult Health

## 2020-07-11 ENCOUNTER — Other Ambulatory Visit: Payer: Self-pay | Admitting: Psychiatry

## 2020-07-11 DIAGNOSIS — F39 Unspecified mood [affective] disorder: Secondary | ICD-10-CM

## 2020-07-11 DIAGNOSIS — F4310 Post-traumatic stress disorder, unspecified: Secondary | ICD-10-CM

## 2020-07-11 DIAGNOSIS — G4700 Insomnia, unspecified: Secondary | ICD-10-CM

## 2020-07-12 NOTE — Telephone Encounter (Signed)
She has apt tomorrow 08/10

## 2020-07-13 ENCOUNTER — Encounter: Payer: Self-pay | Admitting: Psychiatry

## 2020-07-13 ENCOUNTER — Ambulatory Visit (INDEPENDENT_AMBULATORY_CARE_PROVIDER_SITE_OTHER): Payer: BC Managed Care – PPO | Admitting: Psychiatry

## 2020-07-13 ENCOUNTER — Other Ambulatory Visit: Payer: Self-pay

## 2020-07-13 VITALS — Wt 260.0 lb

## 2020-07-13 DIAGNOSIS — F39 Unspecified mood [affective] disorder: Secondary | ICD-10-CM

## 2020-07-13 DIAGNOSIS — G4700 Insomnia, unspecified: Secondary | ICD-10-CM | POA: Diagnosis not present

## 2020-07-13 DIAGNOSIS — F4310 Post-traumatic stress disorder, unspecified: Secondary | ICD-10-CM | POA: Diagnosis not present

## 2020-07-13 DIAGNOSIS — R632 Polyphagia: Secondary | ICD-10-CM | POA: Diagnosis not present

## 2020-07-13 MED ORDER — TOPIRAMATE 25 MG PO TABS: ORAL_TABLET | ORAL | 1 refills | Status: DC

## 2020-07-13 MED ORDER — QUETIAPINE FUMARATE 100 MG PO TABS: ORAL_TABLET | ORAL | 1 refills | Status: DC

## 2020-07-13 NOTE — Progress Notes (Signed)
Amy FURGERSON [id number] Oct 03, 1966 54 y.o.  Subjective:   Patient ID:  Amy [name] is a 54 y.o. (DOB 06-06-1966) female.  Chief Complaint:  Chief Complaint  Patient presents with  . Anxiety  . Depression  . Insomnia  . Paranoid    HPI Amy [name] presents to the office today for follow-up of mood d/o, anxiety, and insomnia. She reports that she is tolerating seroquel. She reports that sleep has improved slightly since starting Seroquel. Estimates sleeping about 6 hours a night. She reports that she has some occ middle of the night awakening. She reports that anxiety and mood have been slightly improved. She reports that she could not find her Xanax this morning and feels more anxious on exam. She reports that panic are happening less and now about once a week. She reports that her mood is slightly improved and continues to experience persistent sad mood. Sadness in response to niece and nephew starting school and college. Has had irritability over the last week. She reports that she has been gaining wt and reports, "I've always been an emotional eater." Denies increased appetite over the last month. She reports energy and motivation have been "about the same." She reports difficulty with concentration and focus. She denies any change in paranoia. Reports occ passive death wishes. Denies SI.   Reports that she started Wellbutrin last Thursday after contacting her PCP about tx options to help with wt loss. She reports that she had a headache, nausea, and light-headedness yesterday. Denies any change in anxiety or irritability since starting Wellbutrin.    Past Medication Trials: Abilify- Irritable, restless, nausea Vraylar- akathisia Latuda- Effective, then stopped working Lamotrigine  Carbamazepine- SJS Prozac- Somewhat helpful for mood and anxiety. Trileptal Sertraline- Limited improvement in mood and anxiety s/s.  Prozac Propranolol- effective for akathisia. May have been  helpful for anxiety. Buspar- "did not like the way it made me feel" Xanax Gabapentin- Adverse effects, drowsiness   PHQ2-9     Office Visit from 11/20/2018 in [facility]  PHQ-2 Total Score 0       Review of Systems:  Review of Systems  Eyes: Positive for visual disturbance.  Endocrine:       Checks glucose in the morning. Reports that her glucose levels has been consistent with past glucose levels.  Musculoskeletal: Negative for gait problem.  Neurological: Positive for headaches.  Psychiatric/Behavioral:       Please refer to HPI    She reports that she recently had a glucose level above 300, which is the highest level that she has been aware of. She reports that she had a milkshake.   Medications: I have reviewed the patient's current medications.  Current Outpatient Medications  Medication Sig Dispense Refill  . ALPRAZolam (XANAX) 0.5 MG tablet Take 0.5 mg by mouth 4 (four) times daily as needed for anxiety.     [name] buPROPion (WELLBUTRIN) 100 MG tablet Take by mouth.    [name] HYDROcodone-acetaminophen (NORCO/VICODIN) 5-325 MG tablet Take 0.5-1 tablets by mouth every 4 (four) hours as needed.    . hydrocortisone 2.5 % cream Apply 1 application topically 2 (two) times daily.    . Lidocaine, Anorectal, (RECTICARE) 5 % CREA Apply topically.    [name] loratadine (CLARITIN) 10 MG tablet Take 10 mg by mouth daily.    [name] losartan (COZAAR) 50 MG tablet Take 50 mg by mouth daily.    [name] loteprednol (LOTEMAX) 0.2 % SUSP 1 drop 4 (four) times daily.    [name]  loteprednol (LOTEMAX) 0.5 % ophthalmic suspension 1 DROP 4 TIMES A DAY INTO BOTH EYES    . metFORMIN (GLUCOPHAGE-XR) 500 MG 24 hr tablet Take 500 mg by mouth daily with breakfast. *May take additional tablet in the evening for high blood glucose levels    . metroNIDAZOLE (METROGEL) 0.75 % gel Apply topically 2 (two) times daily.    . pantoprazole (PROTONIX) 40 MG tablet Take 1 tablet (40 mg total) by mouth daily. 30 tablet 1  . QUEtiapine  (SEROQUEL) 100 MG tablet Take 2 tab po QHS x 2-3 days, then increase to 3 tabs po QHS 60 tablet 1  . Sodium Sulfate-Mag Sulfate-KCl (SUTAB) [phone] MG TABS At 5 PM take 12 tablets using the 8 oz cup provided in the kit drinking 5 cups of water and 5 hours before your procedure repeat the same process. (Patient not taking: Reported on 06/16/2020) 24 tablet 0  . topiramate (TOPAMAX) 25 MG tablet Take 1 tab po QHS x 1 week, then increase to 2 tabs po QHS 60 tablet 1   No current facility-administered medications for this visit.    Medication Side Effects: None  Allergies:  Allergies  Allergen Reactions  . Carbamazepine   . Hydromorphone Nausea And Vomiting  . Ciprofloxacin Rash    Past Medical History:  Diagnosis Date  . Anemia   . Anxiety    xanax prn  . Blood transfusion without reported diagnosis   . Degenerative disc disease   . Diabetes mellitus without complication ([place])   . Fatty liver   . Fibromyalgia   . GERD (gastroesophageal reflux disease)    prilosec otc-instructed to take dos  . Hot flashes 02/19/2014  . Hypertension    controlled on hctz-bp at pat 145/94  . Neuromuscular disorder ([place])    firbrmylagia  . Obesity   . PONV (postoperative nausea and vomiting)    pt states has not had ponv but has been treated in past with scopolamine, does admit to history of motion sickness, history of vertigo  . Vertigo    recent diagnosis of vertigo-uses otc meclizine  . Weight gain 02/19/2014    Family History  Problem Relation Age of Onset  . Cancer Paternal Uncle        liver   . Cancer Maternal Grandmother        throat  . Arthritis Mother   . Anxiety disorder Mother   . Depression Mother   . Diabetes Father   . Hypertension Father   . Arthritis Sister   . Diabetes Maternal Grandfather   . Hypertension Maternal Grandfather   . Diabetes Paternal Grandmother   . Hypertension Paternal Grandmother   . Anxiety disorder Paternal Grandmother   . Diabetes Paternal  Grandfather   . Hypertension Paternal Grandfather   . Mood Disorder Brother   . Arthritis Sister   . Panic disorder Sister   . Psychosis Maternal Uncle   . Mood Disorder Maternal Uncle   . Anxiety disorder Paternal Uncle     Social History   Socioeconomic History  . Marital status: Married    Spouse name: Not on file  . Number of children: Not on file  . Years of education: Not on file  . Highest education level: Not on file  Occupational History  . Not on file  Tobacco Use  . Smoking status: Never Smoker  . Smokeless tobacco: Never Used  Substance and Sexual Activity  . Alcohol use: No  . Drug use: No  .  Sexual activity: Yes    Partners: Male    Birth control/protection: Surgical    Comment: married  Other Topics Concern  . Not on file  Social History Narrative  . Not on file   Social Determinants of Health   Financial Resource Strain:   . Difficulty of Paying Living Expenses:   Food Insecurity:   . Worried About Charity fundraiser in the Last Year:   . Arboriculturist in the Last Year:   Transportation Needs:   . Film/video editor (Medical):   [name] Lack of Transportation (Non-Medical):   Physical Activity:   . Days of Exercise per Week:   . Minutes of Exercise per Session:   Stress:   . Feeling of Stress :   Social Connections:   . Frequency of Communication with Friends and Family:   . Frequency of Social Gatherings with Friends and Family:   . Attends Religious Services:   . Active Member of Clubs or Organizations:   . Attends Archivist Meetings:   [name] Marital Status:   Intimate Partner Violence:   . Fear of Current or Ex-Partner:   . Emotionally Abused:   [name] Physically Abused:   . Sexually Abused:     Past Medical History, Surgical history, Social history, and Family history were reviewed and updated as appropriate.   Please see review of systems for further details on the patient's review from today.   Objective:   Physical Exam:   Wt 260 lb (117.9 kg)   LMP 05/01/2011   BMI 43.27 kg/m   Physical Exam Constitutional:      General: She is not in acute distress. Musculoskeletal:        General: No deformity.  Neurological:     Mental Status: She is alert and oriented to person, place, and time.     Coordination: Coordination normal.  Psychiatric:        Attention and Perception: Attention and perception normal. She does not perceive auditory or visual hallucinations.        Mood and Affect: Mood is anxious and depressed. Affect is not labile, blunt, angry or inappropriate.        Speech: Speech normal.        Behavior: Behavior normal. Behavior is cooperative.        Thought Content: Thought content is paranoid. Thought content is not delusional. Thought content does not include homicidal or suicidal ideation. Thought content does not include homicidal or suicidal plan.        Cognition and Memory: Cognition and memory normal.        Judgment: Judgment normal.     Comments: Insight intact     Lab Review:     Component Value Date/Time   NA 137 06/03/2020 1117   K 4.2 06/03/2020 1117   CL 102 06/03/2020 1117   CO2 27 06/03/2020 1117   GLUCOSE 159 (H) 06/03/2020 1117   BUN 8 06/03/2020 1117   CREATININE 0.77 06/03/2020 1117   CREATININE 0.69 02/19/2014 1542   CALCIUM 9.0 06/03/2020 1117   PROT 7.3 06/03/2020 1117   ALBUMIN 4.0 06/03/2020 1117   AST 36 06/03/2020 1117   ALT 37 06/03/2020 1117   ALKPHOS 112 06/03/2020 1117   BILITOT 0.7 06/03/2020 1117   GFRNONAA >60 06/03/2020 1117   GFRAA >60 06/03/2020 1117       Component Value Date/Time   WBC 10.1 05/23/2020 2027   RBC 5.09 05/23/2020 2027  HGB 14.7 05/23/2020 2027   HCT 43.6 05/23/2020 2027   PLT 255 05/23/2020 2027   MCV 85.7 05/23/2020 2027   MCH 28.9 05/23/2020 2027   MCHC 33.7 05/23/2020 2027   RDW 13.3 05/23/2020 2027   LYMPHSABS 2.1 07/12/2016 2349   MONOABS 0.6 07/12/2016 2349   EOSABS 0.1 07/12/2016 2349   BASOSABS 0.0  07/12/2016 2349    No results found for: POCLITH, LITHIUM   No results found for: PHENYTOIN, PHENOBARB, VALPROATE, CBMZ   .res Assessment: Plan:   Discussed that recent headaches, jitteriness, and increased irritability could be side effects of bupropion immediate release.  Patient reports that she plans to stop bupropion. Discussed alternatives to bupropion for weight loss since patient reports that she is concerned about weight gain.  Discussed potential benefits, risks, and side effects of Topamax to be used off label for weight loss.  Discussed that Topamax is also used off label for mood stabilization and anxiety which may also be helpful.  Will start Topamax 25 mg at bedtime for 1 week, then increase to 50 mg at bedtime. Discussed continuing to titrate Seroquel to 200 mg at bedtime for 2 to 3 days, then to 300 mg at bedtime to help with anxiety, mood stabilization, paranoia, and insomnia. Encourage patient to consider resuming psychotherapy with Georgana Curio, [facility]. Patient to follow-up with this provider in 4 weeks or sooner if clinically indicated. Patient advised to contact office with any questions, adverse effects, or acute worsening in signs and symptoms.  Amy [name] was seen today for anxiety, depression, insomnia and paranoid.  Diagnoses and all orders for this visit:  Binge eating -     topiramate (TOPAMAX) 25 MG tablet; Take 1 tab po QHS x 1 week, then increase to 2 tabs po QHS  PTSD (post-traumatic stress disorder) -     QUEtiapine (SEROQUEL) 100 MG tablet; Take 2 tab po QHS x 2-3 days, then increase to 3 tabs po QHS  Episodic mood disorder (HCC) -     topiramate (TOPAMAX) 25 MG tablet; Take 1 tab po QHS x 1 week, then increase to 2 tabs po QHS -     QUEtiapine (SEROQUEL) 100 MG tablet; Take 2 tab po QHS x 2-3 days, then increase to 3 tabs po QHS  Insomnia, unspecified type -     QUEtiapine (SEROQUEL) 100 MG tablet; Take 2 tab po QHS x 2-3 days, then increase to 3 tabs po  QHS     Please see After Visit Summary for patient specific instructions.  Future Appointments  Date Time Provider [place]  08/10/2020  9:00 AM Thayer Headings, PMHNP [facility] None  08/25/2020  2:00 PM Virgel Manifold, MD [facility] None    No orders of the defined types were placed in this encounter.   -------------------------------

## 2020-07-14 ENCOUNTER — Ambulatory Visit: Payer: BC Managed Care – PPO | Admitting: Psychiatry

## 2020-07-15 ENCOUNTER — Telehealth: Payer: Self-pay | Admitting: Psychiatry

## 2020-07-15 NOTE — Telephone Encounter (Signed)
Pt called, took Topamax last night and was up all night. Is very tired and haven't went to bed. She was emotional on the phone.  [phone]

## 2020-07-15 NOTE — Telephone Encounter (Signed)
Pt called again. Requesting to talk with Janett Billow. [phone].

## 2020-07-15 NOTE — Telephone Encounter (Signed)
LM for patient to call back.

## 2020-07-19 NOTE — Telephone Encounter (Signed)
Left message to call back with an update

## 2020-08-05 ENCOUNTER — Other Ambulatory Visit: Payer: Self-pay | Admitting: Psychiatry

## 2020-08-05 DIAGNOSIS — R632 Polyphagia: Secondary | ICD-10-CM

## 2020-08-05 DIAGNOSIS — F39 Unspecified mood [affective] disorder: Secondary | ICD-10-CM

## 2020-08-10 ENCOUNTER — Telehealth: Payer: Self-pay | Admitting: Psychiatry

## 2020-08-10 ENCOUNTER — Telehealth (INDEPENDENT_AMBULATORY_CARE_PROVIDER_SITE_OTHER): Payer: BC Managed Care – PPO | Admitting: Psychiatry

## 2020-08-10 ENCOUNTER — Encounter: Payer: Self-pay | Admitting: Psychiatry

## 2020-08-10 DIAGNOSIS — F4310 Post-traumatic stress disorder, unspecified: Secondary | ICD-10-CM

## 2020-08-10 DIAGNOSIS — F39 Unspecified mood [affective] disorder: Secondary | ICD-10-CM

## 2020-08-10 DIAGNOSIS — G4700 Insomnia, unspecified: Secondary | ICD-10-CM | POA: Diagnosis not present

## 2020-08-10 MED ORDER — QUETIAPINE FUMARATE 200 MG PO TABS: 200.0000 mg | ORAL_TABLET | Freq: Every day | ORAL | 0 refills | Status: DC

## 2020-08-10 NOTE — Telephone Encounter (Signed)
Ms. Amy, [name] are scheduled for a virtual visit with your provider today.    Just as we do with appointments in the office, we must obtain your consent to participate.  Your consent will be active for this visit and any virtual visit you may have with one of our providers in the next 365 days.    If you have a MyChart account, I can also send a copy of this consent to you electronically.  All virtual visits are billed to your insurance company just like a traditional visit in the office.  As this is a virtual visit, video technology does not allow for your provider to perform a traditional examination.  This may limit your provider's ability to fully assess your condition.  If your provider identifies any concerns that need to be evaluated in person or the need to arrange testing such as labs, EKG, etc, we will make arrangements to do so.    Although advances in technology are sophisticated, we cannot ensure that it will always work on either your end or our end.  If the connection with a video visit is poor, we may have to switch to a telephone visit.  With either a video or telephone visit, we are not always able to ensure that we have a secure connection.   I need to obtain your verbal consent now.   Are you willing to proceed with your visit today?   Amy [name] has provided verbal consent on 08/10/2020 for a virtual visit (video or telephone).   Thayer Headings, PMHNP 08/10/2020  9:16 AM
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
## 2020-08-11 NOTE — Telephone Encounter (Signed)
Please review

## 2020-08-25 ENCOUNTER — Ambulatory Visit: Payer: BC Managed Care – PPO | Admitting: Gastroenterology

## 2020-08-29 ENCOUNTER — Emergency Department (HOSPITAL_COMMUNITY)
Admission: EM | Admit: 2020-08-29 | Discharge: 2020-08-29 | Disposition: A | Discharge status: Home / Self Care | Payer: BC Managed Care – PPO | Attending: Emergency Medicine | Admitting: Emergency Medicine

## 2020-08-29 ENCOUNTER — Encounter (HOSPITAL_COMMUNITY): Payer: Self-pay | Admitting: Emergency Medicine

## 2020-08-29 ENCOUNTER — Other Ambulatory Visit: Payer: Self-pay

## 2020-08-29 ENCOUNTER — Emergency Department (HOSPITAL_COMMUNITY): Payer: BC Managed Care – PPO

## 2020-08-29 DIAGNOSIS — F410 Panic disorder [episodic paroxysmal anxiety] without agoraphobia: Secondary | ICD-10-CM | POA: Diagnosis not present

## 2020-08-29 DIAGNOSIS — Z20822 Contact with and (suspected) exposure to covid-19: Secondary | ICD-10-CM | POA: Insufficient documentation

## 2020-08-29 DIAGNOSIS — R42 Dizziness and giddiness: Secondary | ICD-10-CM | POA: Insufficient documentation

## 2020-08-29 DIAGNOSIS — Z7984 Long term (current) use of oral hypoglycemic drugs: Secondary | ICD-10-CM | POA: Diagnosis not present

## 2020-08-29 DIAGNOSIS — E119 Type 2 diabetes mellitus without complications: Secondary | ICD-10-CM | POA: Insufficient documentation

## 2020-08-29 DIAGNOSIS — I10 Essential (primary) hypertension: Secondary | ICD-10-CM | POA: Diagnosis not present

## 2020-08-29 DIAGNOSIS — Z79899 Other long term (current) drug therapy: Secondary | ICD-10-CM | POA: Insufficient documentation

## 2020-08-29 HISTORY — DX: Panic disorder (episodic paroxysmal anxiety): F41.0

## 2020-08-29 LAB — RESPIRATORY PANEL BY RT PCR (FLU A&B, COVID)
Influenza A by PCR: NEGATIVE
Influenza B by PCR: NEGATIVE
SARS Coronavirus 2 by RT PCR: NEGATIVE

## 2020-08-29 LAB — CBC WITH DIFFERENTIAL/PLATELET
Abs Immature Granulocytes: 0.07 10*3/uL (ref 0.00–0.07)
Basophils Absolute: 0.1 10*3/uL (ref 0.0–0.1)
Basophils Relative: 1 %
Eosinophils Absolute: 0.3 10*3/uL (ref 0.0–0.5)
Eosinophils Relative: 3 %
HCT: 44.6 % (ref 36.0–46.0)
Hemoglobin: 14.4 g/dL (ref 12.0–15.0)
Immature Granulocytes: 1 %
Lymphocytes Relative: 32 %
Lymphs Abs: 2.6 10*3/uL (ref 0.7–4.0)
MCH: 28.4 pg (ref 26.0–34.0)
MCHC: 32.3 g/dL (ref 30.0–36.0)
MCV: 88 fL (ref 80.0–100.0)
Monocytes Absolute: 0.6 10*3/uL (ref 0.1–1.0)
Monocytes Relative: 7 %
Neutro Abs: 4.4 10*3/uL (ref 1.7–7.7)
Neutrophils Relative %: 56 %
Platelets: 237 10*3/uL (ref 150–400)
RBC: 5.07 MIL/uL (ref 3.87–5.11)
RDW: 14 % (ref 11.5–15.5)
WBC: 8 10*3/uL (ref 4.0–10.5)
nRBC: 0 % (ref 0.0–0.2)

## 2020-08-29 LAB — COMPREHENSIVE METABOLIC PANEL
ALT: 44 U/L (ref 0–44)
AST: 45 U/L — ABNORMAL HIGH (ref 15–41)
Albumin: 3.7 g/dL (ref 3.5–5.0)
Alkaline Phosphatase: 115 U/L (ref 38–126)
Anion gap: 10 (ref 5–15)
BUN: 11 mg/dL (ref 6–20)
CO2: 28 mmol/L (ref 22–32)
Calcium: 8.9 mg/dL (ref 8.9–10.3)
Chloride: 100 mmol/L (ref 98–111)
Creatinine, Ser: 0.69 mg/dL (ref 0.44–1.00)
GFR calc Af Amer: >60 mL/min (ref >60)
GFR calc non Af Amer: >60 mL/min (ref >60)
Glucose, Bld: 258 mg/dL — ABNORMAL HIGH (ref 70–99)
Potassium: 3.7 mmol/L (ref 3.5–5.1)
Sodium: 138 mmol/L (ref 135–145)
Total Bilirubin: 0.8 mg/dL (ref 0.3–1.2)
Total Protein: 7.2 g/dL (ref 6.5–8.1)

## 2020-08-29 MED ORDER — METOCLOPRAMIDE HCL 5 MG/ML IJ SOLN
10.0000 mg | Freq: Once | INTRAMUSCULAR | Status: AC
  Administered 2020-08-29: 10 mg via INTRAVENOUS
  Filled 2020-08-29: qty 2

## 2020-08-29 MED ORDER — LORAZEPAM 2 MG/ML IJ SOLN
1.0000 mg | Freq: Once | INTRAMUSCULAR | Status: AC
  Administered 2020-08-29: 1 mg via INTRAVENOUS
  Filled 2020-08-29: qty 1

## 2020-08-29 MED ORDER — SODIUM CHLORIDE 0.9 % IV BOLUS
1000.0000 mL | Freq: Once | INTRAVENOUS | Status: AC
  Administered 2020-08-29: 1000 mL via INTRAVENOUS

## 2020-08-29 MED ORDER — MECLIZINE HCL 25 MG PO TABS
25.0000 mg | ORAL_TABLET | Freq: Three times a day (TID) | ORAL | 0 refills | Status: DC | PRN
Start: 2020-08-29 — End: 2021-03-25

## 2020-08-29 NOTE — ED Provider Notes (Signed)
Pt is feeling much better after treatment.  CT neg.  Covid neg.  Labs neg other than BS elevated at 258, but she has not had her morning meds.  Pt is stable for d/c.  Return if worse.  F/u with pcp.   Isla Pence, MD 08/29/20 [phone]

## 2020-08-29 NOTE — ED Triage Notes (Signed)
Pt with c/o sudden onset dizziness when she tried to stand. Has been nauseated and vomiting. Per EMS, pt received 39m zofran IV en route.

## 2020-08-29 NOTE — ED Provider Notes (Signed)
[facility] EMERGENCY DEPARTMENT Provider Note   CSN: [id number] Arrival date & time: 08/29/20  0547     History Chief Complaint  Patient presents with  . Dizziness    Amy [name] is a 54 y.o. female.  Patient presents to the emergency department for evaluation of dizziness.  Patient reports that she woke up and felt dizzy.  She reports that it did feel like when she had vertigo years ago.  She felt like she was spinning.  Patient then became acutely nauseated.  She has had multiple bowel movements and waking up, but no diarrhea.  Denies abdominal pain.  Patient extremely anxious at arrival.        Past Medical History:  Diagnosis Date  . Anemia   . Anxiety    xanax prn  . Blood transfusion without reported diagnosis   . Degenerative disc disease   . Diabetes mellitus without complication ([place])   . Fatty liver   . Fibromyalgia   . GERD (gastroesophageal reflux disease)    prilosec otc-instructed to take dos  . Hot flashes 02/19/2014  . Hypertension    controlled on hctz-bp at pat 145/94  . Neuromuscular disorder ([place])    firbrmylagia  . Obesity   . Panic attacks   . PONV (postoperative nausea and vomiting)    pt states has not had ponv but has been treated in past with scopolamine, does admit to history of motion sickness, history of vertigo  . Vertigo    recent diagnosis of vertigo-uses otc meclizine  . Weight gain 02/19/2014    Patient Active Problem List   Diagnosis Date Noted  . Class 3 severe obesity due to excess calories in adult ([address]) 11/17/2019  . Insomnia 11/20/2018  . Abscess of vagina 11/16/2018  . Social phobia 08/24/2018  . Panic disorder 08/24/2018  . Night sweats 03/19/2017  . Screening for colorectal cancer 03/19/2017  . Rectocele 03/19/2017  . Rectal irritation 03/19/2017  . Hot flashes 02/19/2014  . Weight gain 02/19/2014  . Internal hemorrhoid 02/19/2014    Past Surgical History:  Procedure Laterality Date  . ABDOMINAL  HYSTERECTOMY    . BREAST REDUCTION SURGERY     2010  . ECTOPIC PREGNANCY SURGERY     2002  . LAPAROSCOPIC ASSISTED VAGINAL HYSTERECTOMY  06/14/2011   Procedure: LAPAROSCOPIC ASSISTED VAGINAL HYSTERECTOMY;  Surgeon: Luz Lex, MD;  Location: [facility];  Service: Gynecology;  Laterality: N/A;  Abdomen and vagina  . LAPAROSCOPY     2006-for adhesions     OB History    Gravida  2   Para      Term      Preterm      AB  2   Living  0     SAB  1   TAB      Ectopic  1   Multiple      Live Births              Family History  Problem Relation Age of Onset  . Cancer Paternal Uncle        liver   . Cancer Maternal Grandmother        throat  . Arthritis Mother   . Anxiety disorder Mother   . Depression Mother   . Diabetes Father   . Hypertension Father   . Arthritis Sister   . Diabetes Maternal Grandfather   . Hypertension Maternal Grandfather   . Diabetes Paternal Grandmother   . Hypertension  Paternal Grandmother   . Anxiety disorder Paternal Grandmother   . Diabetes Paternal Grandfather   . Hypertension Paternal Grandfather   . Mood Disorder Brother   . Arthritis Sister   . Panic disorder Sister   . Psychosis Maternal Uncle   . Mood Disorder Maternal Uncle   . Anxiety disorder Paternal Uncle     Social History   Tobacco Use  . Smoking status: Never Smoker  . Smokeless tobacco: Never Used  Substance Use Topics  . Alcohol use: No  . Drug use: No    Home Medications Prior to Admission medications   Medication Sig Start Date End Date Taking? Authorizing Provider  ALPRAZolam Duanne Moron) 0.5 MG tablet Take 0.5 mg by mouth 4 (four) times daily as needed for anxiety.     [provider]  famotidine-calcium carbonate-magnesium hydroxide (PEPCID COMPLETE) [phone] MG chewable tablet Chew 1 tablet by mouth daily as needed.    [provider]  FLUoxetine (PROZAC) 20 MG capsule Take 20 mg by mouth daily.    [provider]   hydrocortisone 2.5 % cream Apply 1 application topically 2 (two) times daily.    [provider]  Lidocaine, Anorectal, (RECTICARE) 5 % CREA Apply topically.    [provider]  loratadine (CLARITIN) 10 MG tablet Take 10 mg by mouth daily.    [provider]  losartan (COZAAR) 50 MG tablet Take 50 mg by mouth daily.    [provider]  loteprednol (LOTEMAX) 0.2 % SUSP 1 drop 4 (four) times daily.    [provider]  loteprednol (LOTEMAX) 0.5 % ophthalmic suspension 1 DROP 4 TIMES A DAY INTO BOTH EYES 05/20/20   [provider]  meclizine (ANTIVERT) 25 MG tablet Take 1 tablet (25 mg total) by mouth 3 (three) times daily as needed for dizziness. 08/29/20   Orpah Greek, MD  metFORMIN (GLUCOPHAGE-XR) 500 MG 24 hr tablet Take 500 mg by mouth 2 (two) times daily with a meal. *May take additional tablet in the evening for high blood glucose levels 07/05/18   [provider]  metroNIDAZOLE (METROGEL) 0.75 % gel Apply topically 2 (two) times daily. 05/31/20   [provider]  pantoprazole (PROTONIX) 40 MG tablet Take 1 tablet (40 mg total) by mouth daily. Patient not taking: Reported on 08/10/2020 05/24/20 05/24/21  Rudene Re, MD  QUEtiapine (SEROQUEL) 200 MG tablet Take 1 tablet (200 mg total) by mouth at bedtime. 08/10/20 11/08/20  Thayer Headings, PMHNP  Sodium Sulfate-Mag Sulfate-KCl (SUTAB) [phone] MG TABS At 5 PM take 12 tablets using the 8 oz cup provided in the kit drinking 5 cups of water and 5 hours before your procedure repeat the same process. Patient not taking: Reported on 06/16/2020 05/27/20   Virgel Manifold, MD  topiramate (TOPAMAX) 25 MG tablet TAKE 1 TABLET BY MOUTH AT BEDTIME FOR 1 WEEK, THEN INCREASE TO 2 AT BEDTIME 08/11/20   Thayer Headings, PMHNP    Allergies    Carbamazepine, Hydromorphone, and Ciprofloxacin  Review of Systems   Review of Systems  Gastrointestinal: Positive for nausea.   Neurological: Positive for dizziness.  Psychiatric/Behavioral: The patient is nervous/anxious.   All other systems reviewed and are negative.   Physical Exam Updated Vital Signs BP 119/71   Pulse 91   Temp 97.7 F (36.5 C) (Oral)   Resp 18   Ht 5' 5"  (1.651 m)   Wt 117.9 kg   LMP 05/01/2011   SpO2 93%   BMI  43.27 kg/m   Physical Exam Vitals and nursing note reviewed.  Constitutional:      General: She is not in acute distress.    Appearance: Normal appearance. She is well-developed.  HENT:     Head: Normocephalic and atraumatic.     Right Ear: Hearing normal.     Left Ear: Hearing normal.     Nose: Nose normal.  Eyes:     Conjunctiva/sclera: Conjunctivae normal.     Pupils: Pupils are equal, round, and reactive to light.  Cardiovascular:     Rate and Rhythm: Regular rhythm.     Heart sounds: S1 normal and S2 normal. No murmur heard.  No friction rub. No gallop.   Pulmonary:     Effort: Pulmonary effort is normal. No respiratory distress.     Breath sounds: Normal breath sounds.  Chest:     Chest wall: No tenderness.  Abdominal:     General: Bowel sounds are normal.     Palpations: Abdomen is soft.     Tenderness: There is no abdominal tenderness. There is no guarding or rebound. Negative signs include Murphy's sign and McBurney's sign.     Hernia: No hernia is present.  Musculoskeletal:        General: Normal range of motion.     Cervical back: Normal range of motion and neck supple.  Skin:    General: Skin is warm and dry.     Findings: No rash.  Neurological:     Mental Status: She is alert and oriented to person, place, and time.     GCS: GCS eye subscore is 4. GCS verbal subscore is 5. GCS motor subscore is 6.     Cranial Nerves: No cranial nerve deficit.     Sensory: No sensory deficit.     Coordination: Coordination normal.  Psychiatric:        Speech: Speech normal.        Behavior: Behavior normal.        Thought Content: Thought content normal.      ED Results / Procedures / Treatments   Labs (all labs ordered are listed, but only abnormal results are displayed) Labs Reviewed  RESPIRATORY PANEL BY RT PCR (FLU A&B, COVID)  CBC WITH DIFFERENTIAL/PLATELET  COMPREHENSIVE METABOLIC PANEL  URINALYSIS, ROUTINE W REFLEX MICROSCOPIC    EKG None  Radiology No results found.  Procedures Procedures (including critical care time)  Medications Ordered in ED Medications  sodium chloride 0.9 % bolus 1,000 mL (1,000 mLs Intravenous New Bag/Given 08/29/20 0622)  LORazepam (ATIVAN) injection 1 mg (1 mg Intravenous Given 08/29/20 0623)  metoCLOPramide (REGLAN) injection 10 mg (10 mg Intravenous Given 08/29/20 3545)    ED Course  I have reviewed the triage vital signs and the nursing notes.  Pertinent labs & imaging results that were available during my care of the patient were reviewed by me and considered in my medical decision making (see chart for details).    MDM Rules/Calculators/A&P                          Patient presents to the emergency department for evaluation of dizziness.  Patient had sudden onset of dizziness when she woke up around 3:30 AM.  She was having vertigo symptoms with movement initially but symptoms do seem to have improved somewhat.  She does have a distant history of vertigo.  Patient experiencing severe anxiety and panic attack at arrival to the ED.  She was treated with Reglan, Ativan and fluids.  Will perform work-up.  Will sign out to oncoming ER physician.  Anticipate discharge if work-up is negative.  Final Clinical Impression(s) / ED Diagnoses Final diagnoses:  Vertigo  Panic attack    Rx / DC Orders ED Discharge Orders         Ordered    meclizine (ANTIVERT) 25 MG tablet  3 times daily PRN        08/29/20 0656           Orpah Greek, MD 08/29/20 [id number]

## 2020-09-23 ENCOUNTER — Encounter: Payer: Self-pay | Admitting: Psychiatry

## 2020-10-12 ENCOUNTER — Ambulatory Visit: Payer: BC Managed Care – PPO | Admitting: Psychiatry

## 2020-10-13 ENCOUNTER — Ambulatory Visit: Payer: BC Managed Care – PPO | Admitting: Psychiatry

## 2020-11-09 ENCOUNTER — Ambulatory Visit: Payer: BC Managed Care – PPO | Admitting: Psychiatry

## 2020-11-11 ENCOUNTER — Ambulatory Visit (INDEPENDENT_AMBULATORY_CARE_PROVIDER_SITE_OTHER): Payer: BC Managed Care – PPO | Admitting: Psychiatry

## 2020-11-11 ENCOUNTER — Other Ambulatory Visit: Payer: Self-pay

## 2020-11-11 ENCOUNTER — Encounter: Payer: Self-pay | Admitting: Psychiatry

## 2020-11-11 DIAGNOSIS — F39 Unspecified mood [affective] disorder: Secondary | ICD-10-CM

## 2020-11-11 DIAGNOSIS — F4310 Post-traumatic stress disorder, unspecified: Secondary | ICD-10-CM | POA: Diagnosis not present

## 2020-11-11 MED ORDER — RISPERIDONE 0.5 MG PO TABS: ORAL_TABLET | ORAL | 1 refills | Status: DC

## 2020-11-11 NOTE — Progress Notes (Signed)
Amy [name] [id number] 06-18-1966 54 y.o.  Subjective:   Patient ID:  Amy [name] is a 54 y.o. (DOB 04/12/1966) female.  Chief Complaint:  Chief Complaint  Patient presents with  . Anxiety  . Depression  . Paranoid    HPI Amy [name] presents to the office today for follow-up of anxiety, depression, insomnia, and paranoia.  She reports that she has been having difficulty falling asleep and staying asleep and may not be able to fall asleep until 4-5 am. Having nightmares and vivid dreams. She reports weight gain and reports that she has been eating at night when unable to sleep. Low energy and motivation. She reports that she will occasionally go a week without bathing. Reports that she is easily angered, such as while driving in traffic.   She reports that she is having increased panic attacks. Reports that she has panic attacks in the shower. Reports that she had severe anxiety with being asked to join a small group. Has severe anxiety with going into any public place and has to take Xanax before going to apts or church. She reports periods of shakiness and is unsure if this is related to glucose levels, BP, or anxiety. Frequent worry about "everything." She reports poor concentration and will lose her train of thought while talking. Reports that she cannot recall things that she has done, ie went to the pharmacy to pick up a medication that she picked up already in the morning. She reports paranoia has been severe. Reports that she is occasionally hearing things that her husband does not hear. Denies SI.   Continues to have frequent flashbacks and intrusive memories. Recently made a connection with someone that helped in her medical care when she had a near death experience and lost her baby.   Husband has been out of work due to medical reasons and she has not been able to afford to come for treatment more often. Her father is going to need radiation for Prostate cancer.   She  reports that she has not been taking Seroquel and cannot remember when she last took it. Cannot recall response.   Past Medication Trials: Abilify- Irritable, restless, nausea Vraylar- akathisia Latuda- Effective, then stopped working Seroquel Lamotrigine Carbamazepine- SJS Topamax-Sleeplessness, irritability Prozac- Somewhat helpful for mood and anxiety. Trileptal Topamax Sertraline- Limited improvement in mood and anxiety s/s.  Prozac Propranolol- effective for akathisia. May have been helpful for anxiety. Buspar- "did not like the way it made me feel" Xanax Gabapentin- Adverse effects, drowsiness  PHQ2-9   Flowsheet Row Office Visit from 11/20/2018 in [facility]  PHQ-2 Total Score 0       Review of Systems:  Review of Systems  Musculoskeletal: Negative for gait problem.  Neurological: Positive for dizziness. Negative for tremors.  Psychiatric/Behavioral:       Please refer to HPI  Reports that she is snoring excessively and plans to try to schedule a sleep study.   Medications: I have reviewed the patient's current medications.  Current Outpatient Medications  Medication Sig Dispense Refill  . ALPRAZolam (XANAX) 0.5 MG tablet Take 0.5 mg by mouth 4 (four) times daily as needed for anxiety.     [name] FLUoxetine (PROZAC) 20 MG capsule Take 20 mg by mouth daily.    [name] losartan (COZAAR) 100 MG tablet Take 50 mg by mouth daily.    . metFORMIN (GLUCOPHAGE-XR) 500 MG 24 hr tablet Take 500 mg by mouth 2 (two) times daily with a meal. *  May take additional tablet in the evening for high blood glucose levels    . famotidine-calcium carbonate-magnesium hydroxide (PEPCID COMPLETE) 10-800-165 MG chewable tablet Chew 1 tablet by mouth daily as needed.    . hydrocortisone 2.5 % cream Apply 1 application topically 2 (two) times daily.    . Lidocaine, Anorectal, (RECTICARE) 5 % CREA Apply topically.    [name] loratadine (CLARITIN) 10 MG tablet Take 10 mg by mouth daily.    [name]  loteprednol (LOTEMAX) 0.2 % SUSP 1 drop 4 (four) times daily.    [name] loteprednol (LOTEMAX) 0.5 % ophthalmic suspension 1 DROP 4 TIMES A DAY INTO BOTH EYES    . meclizine (ANTIVERT) 25 MG tablet Take 1 tablet (25 mg total) by mouth 3 (three) times daily as needed for dizziness. 30 tablet 0  . metroNIDAZOLE (METROGEL) 0.75 % gel Apply topically 2 (two) times daily.    . pantoprazole (PROTONIX) 40 MG tablet Take 1 tablet (40 mg total) by mouth daily. (Patient not taking: Reported on 08/10/2020) 30 tablet 1  . risperiDONE (RISPERDAL) 0.5 MG tablet Take 1/2 tab po QHS for 5-7 nights, then increase to 1 tab po QHS 30 tablet 1  . Sodium Sulfate-Mag Sulfate-KCl (SUTAB) [id number] MG TABS At 5 PM take 12 tablets using the 8 oz cup provided in the kit drinking 5 cups of water and 5 hours before your procedure repeat the same process. (Patient not taking: Reported on 06/16/2020) 24 tablet 0   No current facility-administered medications for this visit.    Medication Side Effects: None  Allergies:  Allergies  Allergen Reactions  . Carbamazepine   . Hydromorphone Nausea And Vomiting  . Ciprofloxacin Rash    Past Medical History:  Diagnosis Date  . Anemia   . Anxiety    xanax prn  . Blood transfusion without reported diagnosis   . Degenerative disc disease   . Diabetes mellitus without complication ([place])   . Fatty liver   . Fibromyalgia   . GERD (gastroesophageal reflux disease)    prilosec otc-instructed to take dos  . Hot flashes 02/19/2014  . Hypertension    controlled on hctz-bp at pat 145/94  . Neuromuscular disorder ([place])    firbrmylagia  . Obesity   . Panic attacks   . PONV (postoperative nausea and vomiting)    pt states has not had ponv but has been treated in past with scopolamine, does admit to history of motion sickness, history of vertigo  . Vertigo    recent diagnosis of vertigo-uses otc meclizine  . Weight gain 02/19/2014    Family History  Problem Relation Age of Onset   . Cancer Paternal Uncle        liver   . Cancer Maternal Grandmother        throat  . Arthritis Mother   . Anxiety disorder Mother   . Depression Mother   . Diabetes Father   . Hypertension Father   . Arthritis Sister   . Diabetes Maternal Grandfather   . Hypertension Maternal Grandfather   . Diabetes Paternal Grandmother   . Hypertension Paternal Grandmother   . Anxiety disorder Paternal Grandmother   . Diabetes Paternal Grandfather   . Hypertension Paternal Grandfather   . Mood Disorder Brother   . Arthritis Sister   . Panic disorder Sister   . Psychosis Maternal Uncle   . Mood Disorder Maternal Uncle   . Anxiety disorder Paternal Uncle     Social History   Socioeconomic History  .  Marital status: Married    Spouse name: Not on file  . Number of children: Not on file  . Years of education: Not on file  . Highest education level: Not on file  Occupational History  . Not on file  Tobacco Use  . Smoking status: Never Smoker  . Smokeless tobacco: Never Used  Substance and Sexual Activity  . Alcohol use: No  . Drug use: No  . Sexual activity: Yes    Partners: Male    Birth control/protection: Surgical    Comment: married  Other Topics Concern  . Not on file  Social History Narrative  . Not on file   Social Determinants of Health   Financial Resource Strain: Not on file  Food Insecurity: Not on file  Transportation Needs: Not on file  Physical Activity: Not on file  Stress: Not on file  Social Connections: Not on file  Intimate Partner Violence: Not on file    Past Medical History, Surgical history, Social history, and Family history were reviewed and updated as appropriate.   Please see review of systems for further details on the patient's review from today.   Objective:   Physical Exam:  LMP 05/01/2011   Physical Exam Constitutional:      General: She is not in acute distress. Musculoskeletal:        General: No deformity.  Neurological:      Mental Status: She is alert and oriented to person, place, and time.     Coordination: Coordination normal.  Psychiatric:        Attention and Perception: Attention and perception normal. She does not perceive auditory or visual hallucinations.        Mood and Affect: Mood is anxious and depressed. Affect is not labile, blunt, angry or inappropriate.        Speech: Speech normal.        Behavior: Behavior normal.        Thought Content: Thought content normal. Thought content is not paranoid or delusional. Thought content does not include homicidal or suicidal ideation. Thought content does not include homicidal or suicidal plan.        Cognition and Memory: Cognition and memory normal.        Judgment: Judgment normal.     Comments: Insight intact     Lab Review:     Component Value Date/Time   NA 138 08/29/2020 0615   K 3.7 08/29/2020 0615   CL 100 08/29/2020 0615   CO2 28 08/29/2020 0615   GLUCOSE 258 (H) 08/29/2020 0615   BUN 11 08/29/2020 0615   CREATININE 0.69 08/29/2020 0615   CREATININE 0.69 02/19/2014 1542   CALCIUM 8.9 08/29/2020 0615   PROT 7.2 08/29/2020 0615   ALBUMIN 3.7 08/29/2020 0615   AST 45 (H) 08/29/2020 0615   ALT 44 08/29/2020 0615   ALKPHOS 115 08/29/2020 0615   BILITOT 0.8 08/29/2020 0615   GFRNONAA >60 08/29/2020 0615   GFRAA >60 08/29/2020 0615       Component Value Date/Time   WBC 8.0 08/29/2020 0615   RBC 5.07 08/29/2020 0615   HGB 14.4 08/29/2020 0615   HCT 44.6 08/29/2020 0615   PLT 237 08/29/2020 0615   MCV 88.0 08/29/2020 0615   MCH 28.4 08/29/2020 0615   MCHC 32.3 08/29/2020 0615   RDW 14.0 08/29/2020 0615   LYMPHSABS 2.6 08/29/2020 0615   MONOABS 0.6 08/29/2020 0615   EOSABS 0.3 08/29/2020 0615   BASOSABS 0.1 08/29/2020 0615  No results found for: POCLITH, LITHIUM   No results found for: PHENYTOIN, PHENOBARB, VALPROATE, CBMZ   .res Assessment: Plan:    Pt seen for 30 minutes and time spent counseling pt regarding  possible treatment options to include Risperdal and re-starting Sertraline. Reviewed potential metabolic side effects associated with atypical antipsychotics, as well as potential risk for movement side effects. Advised pt to contact office if movement side effects occur. Pt agrees to trial of Risperdal.  Start Risperdal 0.5 mg po QHS for 5-7 nights, then increase to 1 tab po QHS.  Will continue Prozac 20 mg po qd for mood and anxiety. Discussed considering switching Prozac back to Sertraline in the future since this may have been more effective for her mood and anxiety.  Pt to follow-up in 1-2 months or sooner if clinically indicated.  Agree with plan to resume therapy with Georgana Curio, [facility].  Patient advised to contact office with any questions, adverse effects, or acute worsening in signs and symptoms.    Consepcion was seen today for anxiety, depression and paranoid.  Diagnoses and all orders for this visit:  PTSD (post-traumatic stress disorder) -     risperiDONE (RISPERDAL) 0.5 MG tablet; Take 1/2 tab po QHS for 5-7 nights, then increase to 1 tab po QHS  Episodic mood disorder (H[facility]) -     risperiDONE (RISPERDAL) 0.5 MG tablet; Take 1/2 tab po QHS for 5-7 nights, then increase to 1 tab po QHS     Please see After Visit Summary for patient specific instructions.  Future Appointments  Date Time Provider [place]  11/23/2020  4:00 PM May, [place], [facility] None  12/02/2020  3:00 PM May, [place], [facility] CP-CP None  12/17/2020 10:00 AM Croitoru, Dani Gobble, MD [facility]  01/12/2021  8:30 AM Thayer Headings, PMHNP CP-CP None  01/17/2021 12:00 PM May, [place], [facility] CP-CP None    No orders of the defined types were placed in this encounter.   -------------------------------

## 2020-11-23 ENCOUNTER — Ambulatory Visit: Payer: BC Managed Care – PPO | Admitting: Psychiatry

## 2020-12-02 ENCOUNTER — Ambulatory Visit: Payer: BC Managed Care – PPO | Admitting: Psychiatry

## 2020-12-08 ENCOUNTER — Other Ambulatory Visit: Payer: Self-pay | Admitting: Psychiatry

## 2020-12-08 DIAGNOSIS — F4310 Post-traumatic stress disorder, unspecified: Secondary | ICD-10-CM

## 2020-12-08 DIAGNOSIS — F39 Unspecified mood [affective] disorder: Secondary | ICD-10-CM

## 2020-12-14 ENCOUNTER — Ambulatory Visit: Payer: BC Managed Care – PPO | Admitting: Psychiatry

## 2020-12-17 ENCOUNTER — Ambulatory Visit: Payer: BC Managed Care – PPO | Admitting: Cardiovascular Disease

## 2020-12-23 ENCOUNTER — Ambulatory Visit: Payer: BC Managed Care – PPO | Admitting: Cardiovascular Disease

## 2021-01-12 ENCOUNTER — Ambulatory Visit: Payer: BC Managed Care – PPO | Admitting: Psychiatry

## 2021-01-17 ENCOUNTER — Ambulatory Visit: Payer: BC Managed Care – PPO | Admitting: Psychiatry

## 2021-01-17 ENCOUNTER — Telehealth: Payer: Self-pay | Admitting: Cardiovascular Disease

## 2021-01-17 DIAGNOSIS — Z5181 Encounter for therapeutic drug level monitoring: Secondary | ICD-10-CM | POA: Diagnosis not present

## 2021-01-17 DIAGNOSIS — I10 Essential (primary) hypertension: Secondary | ICD-10-CM

## 2021-01-17 NOTE — Telephone Encounter (Signed)
Left message to call back  

## 2021-01-17 NOTE — Telephone Encounter (Signed)
Follow up:    potient calling to speak with the nurse she forgot to as something.

## 2021-01-17 NOTE — Telephone Encounter (Signed)
Patient of Dr. Sallyanne Kuster - operator message states chest pain, shortness of breath, weakness for about 1 week off an on  Spoke with patient about concerns. She states her PCP changed her from losartan 161m to HCTZ 228mx2 in the morning, per her request b/c she thought losartan was causing her weight gain. Our records indicated she was taking losartan 5060maily but she states this was increased d/t her BP increasing after a steroid injection. She took HCTZ a few weeks ago but felt weak, no energy so went back to losartan 100m78m few days ago, she tried HCTZ again w/same symptoms. She took losartan 100mg36mterday again. She sounds lethargic on the phone, reports threw up this morning.   Her BP now is 164/94 and HR 115. She has been in bed She does not have a log of her BP readings with her or from recollection   She has not contacted her PCP yet, who made the med changes  Will send to Dr. C [facility] [place]

## 2021-01-17 NOTE — Telephone Encounter (Signed)
Pt c/o medication issue:  1. Name of Medication: hydrochlorothiazide 25 mg  2. How are you currently taking this medication (dosage and times per day)? 2 tablets in the morning (50 mg total)  3. Are you having a reaction (difficulty breathing--STAT)? Yes   4. What is your medication issue?  Patient states her PCP switched her to hydrochlorothiazide about 1 week ago and ever since she began taking it she has had chest pain and SOB on and off. She states she has also been extremely weak. Patient states she switched back to Losartan yesterday due to the symptoms, but she is still experiencing SOB.   Pt c/o of Chest Pain: STAT if CP now or developed within 24 hours  1. Are you having CP right now? No   2. Are you experiencing any other symptoms (ex. SOB, nausea, vomiting, sweating)? SOB, weakness  3. How long have you been experiencing CP? Patient states CP has been going on for about 1 week  4. Is your CP continuous or coming and going? Coming and going  5. Have you taken Nitroglycerin? No  ?  Pt c/o Shortness Of Breath: STAT if SOB developed within the last 24 hours or pt is noticeably SOB on the phone  1. Are you currently SOB (can you hear that pt is SOB on the phone)? Yes   2. How long have you been experiencing SOB? About 1 week per patient  3. Are you SOB when sitting or when up moving around? Both   4. Are you currently experiencing any other symptoms? Weakness

## 2021-01-17 NOTE — Telephone Encounter (Signed)
Blood pressure 122/81 at last check Reviewed recommendations with patient, verbalized understanding  Will come to office for labs

## 2021-01-17 NOTE — Telephone Encounter (Signed)
It sounds like she may be hypokalemic and/or dehydrated. Can we please have her chaeck a BMET and Mg? Would stay on losartan 100 mg daily, but not take HCTZ until we get the labs.

## 2021-01-18 ENCOUNTER — Telehealth: Payer: Self-pay | Admitting: Cardiovascular Disease

## 2021-01-18 DIAGNOSIS — E669 Obesity, unspecified: Secondary | ICD-10-CM

## 2021-01-18 DIAGNOSIS — I10 Essential (primary) hypertension: Secondary | ICD-10-CM

## 2021-01-18 DIAGNOSIS — Z79899 Other long term (current) drug therapy: Secondary | ICD-10-CM

## 2021-01-18 DIAGNOSIS — E1169 Type 2 diabetes mellitus with other specified complication: Secondary | ICD-10-CM

## 2021-01-18 LAB — MAGNESIUM: Magnesium: 1.9 mg/dL (ref 1.6–2.3)

## 2021-01-18 LAB — BASIC METABOLIC PANEL
BUN/Creatinine Ratio: 13 (ref 9–23)
BUN: 32 mg/dL — ABNORMAL HIGH (ref 6–24)
CO2: 24 mmol/L (ref 20–29)
Calcium: 9.6 mg/dL (ref 8.7–10.2)
Chloride: 95 mmol/L — ABNORMAL LOW (ref 96–106)
Creatinine, Ser: 2.51 mg/dL — ABNORMAL HIGH (ref 0.57–1.00)
GFR calc Af Amer: 25 mL/min/{1.73_m2} — ABNORMAL LOW (ref >59)
GFR calc non Af Amer: 21 mL/min/{1.73_m2} — ABNORMAL LOW (ref >59)
Glucose: 165 mg/dL — ABNORMAL HIGH (ref 65–99)
Potassium: 3.7 mmol/L (ref 3.5–5.2)
Sodium: 138 mmol/L (ref 134–144)

## 2021-01-18 MED ORDER — AMLODIPINE BESYLATE 5 MG PO TABS
5.0000 mg | ORAL_TABLET | Freq: Every day | ORAL | 3 refills | Status: DC
Start: 2021-01-18 — End: 2021-01-21

## 2021-01-18 NOTE — Telephone Encounter (Signed)
Returned the call to the patient. She has been advised that the A1C will be added to her labs. She will have them done in [place].

## 2021-01-18 NOTE — Telephone Encounter (Signed)
Pt c/o medication issue:  1. Name of Medication: metFORMIN (GLUCOPHAGE-XR) 500 MG 24 hr tablet  2. How are you currently taking this medication (dosage and times per day)? 5 tablets daily  3. Are you having a reaction (difficulty breathing--STAT)? no  4. What is your medication issue? Patient states her PCP increased her to 2579m of metformin daily. She states he said to go ahead and take this many to knock it out. She states her A1C was not that high. She would like to know if the blood that was already taken could also be tested for her A1C. If not  She would like to know if an order can be put in so she can have it done.

## 2021-01-18 NOTE — Telephone Encounter (Signed)
Patient is requesting to review lab results.

## 2021-01-18 NOTE — Telephone Encounter (Signed)
Amy Klein, MD  01/18/2021 8:24 AM EST Back to Top     Acute renal insufficiency, likely due to hypovolemia.  Stay off the hydrochlorothiazide. Please stop the losartan and the metformin as well, until renal function recovers. Start amlodipine 5 mg once daily for her blood pressure in the meantime. Please drink plenty of water. I would recommend renal artery Dopplers to make sure that were not dealing with bilateral renal artery stenosis. Recheck a basic metabolic panel on Thursday. Magnesium not back yet (if it comes back abnormal, please also repeat a magnesium with the BMET; if normal, no need to repeat it)   Patient called with results per MD She is aware of med changes Rx(s) sent to pharmacy electronically. Renal doppler ordered/BMET ordered Advised she can have BMET done in our office if doppler is this week, otherwise will need to get done at [facility] in [place]

## 2021-01-19 ENCOUNTER — Telehealth: Payer: Self-pay | Admitting: Cardiovascular Disease

## 2021-01-19 NOTE — Telephone Encounter (Signed)
    Pt would like to ask Dr. Lurline Del nurse if she gets her renal on Friday can she get her result right away before Dr. Loletha Grayer leave for vacation. She was told Dr. Loletha Grayer will be out for a week and she wont be able to rest if she have to wait for the result until he gets back.

## 2021-01-19 NOTE — Telephone Encounter (Signed)
The patient has been made aware and verbalized her understanding.

## 2021-01-19 NOTE — Telephone Encounter (Signed)
Spoke to patient she stated she is scheduled for renal dopplers 2/18.She was told Dr.Croitoru will be going out of town for 1 week.She would like to know results before then.She also has gained weight since starting on Losartan.She has follow up appointment scheduled with Dr.Croitoru 03/25/21.She would like a sooner appointment if one becomes available.Advised I will send message to Dr.Croitoru's RN.

## 2021-01-19 NOTE — Telephone Encounter (Signed)
Dr. Ellyn Hack will be covering for me while gone, but I will try to get a look at Dopplers on the 18th. I would wait at least 2-3 weeks after all these changes in her meds, to see where BP and kidney function settles before making additional plans.

## 2021-01-20 DIAGNOSIS — E669 Obesity, unspecified: Secondary | ICD-10-CM | POA: Diagnosis not present

## 2021-01-20 DIAGNOSIS — E1169 Type 2 diabetes mellitus with other specified complication: Secondary | ICD-10-CM | POA: Diagnosis not present

## 2021-01-20 DIAGNOSIS — Z79899 Other long term (current) drug therapy: Secondary | ICD-10-CM | POA: Diagnosis not present

## 2021-01-20 DIAGNOSIS — I10 Essential (primary) hypertension: Secondary | ICD-10-CM | POA: Diagnosis not present

## 2021-01-21 ENCOUNTER — Telehealth: Payer: Self-pay

## 2021-01-21 ENCOUNTER — Ambulatory Visit (HOSPITAL_COMMUNITY)
Admission: RE | Admit: 2021-01-21 | Discharge: 2021-01-21 | Disposition: A | Discharge status: Home / Self Care | Payer: Medicare HMO | Source: Ambulatory Visit | Attending: Cardiology | Admitting: Cardiology

## 2021-01-21 ENCOUNTER — Other Ambulatory Visit: Payer: Self-pay

## 2021-01-21 DIAGNOSIS — I10 Essential (primary) hypertension: Secondary | ICD-10-CM | POA: Diagnosis not present

## 2021-01-21 LAB — HEMOGLOBIN A1C
Est. average glucose Bld gHb Est-mCnc: 177 mg/dL
Hgb A1c MFr Bld: 7.8 % — ABNORMAL HIGH (ref 4.8–5.6)

## 2021-01-21 LAB — BASIC METABOLIC PANEL
BUN/Creatinine Ratio: 16 (ref 9–23)
BUN: 16 mg/dL (ref 6–24)
CO2: 23 mmol/L (ref 20–29)
Calcium: 9.6 mg/dL (ref 8.7–10.2)
Chloride: 101 mmol/L (ref 96–106)
Creatinine, Ser: 1.03 mg/dL — ABNORMAL HIGH (ref 0.57–1.00)
GFR calc Af Amer: 72 mL/min/{1.73_m2} (ref >59)
GFR calc non Af Amer: 63 mL/min/{1.73_m2} (ref >59)
Glucose: 148 mg/dL — ABNORMAL HIGH (ref 65–99)
Potassium: 3.8 mmol/L (ref 3.5–5.2)
Sodium: 145 mmol/L — ABNORMAL HIGH (ref 134–144)

## 2021-01-21 MED ORDER — CARVEDILOL 6.25 MG PO TABS
6.2500 mg | ORAL_TABLET | Freq: Two times a day (BID) | ORAL | 3 refills | Status: DC
Start: 2021-01-21 — End: 2021-12-16

## 2021-01-21 NOTE — Telephone Encounter (Signed)
I think the swelling and the flushing sensation are side effect so amlodipine, not a true allergy. Stop amlodipine and try carvedilol 6.25 mg bid in addition to her losartan. Really needs to be seen in the office, not sure if there is an APP appt available. I am not in office next 10 days.

## 2021-01-21 NOTE — Telephone Encounter (Signed)
Called pt to inform her of the recommendations that Dr. Loletha Grayer made. Dr. Loletha Grayer believes that these are side effects and not an allergic reaction.  Discontinued amlodipine and switch tp carvedilol 6.53m twice daily.  Appointment made for pt to see HAlmyra Deforest PA on Tues 2/22 to follow up on medication change. Pt verbalizes understanding.

## 2021-01-21 NOTE — Telephone Encounter (Signed)
Keisha from [facility] called triage, stating issues with this pt. Pt states that she first started taking the amlodipine on Monday Feb. 14th and has been taking as prescribed. Pt states that she feels hot, is having shortness of breath and has swelling in her bilateral lower extremities.  Blood pressure taken in [place] with result 156/85.  Advised pt to stop taking amlodipine at this time, with concern of side effects vs allergic reaction.  Pt is also diabetic and has been NPO for procedure, advised that pt can be given something to help with feelings of possible hypoglycemia. Will route to pharmacy and Dr. Loletha Grayer for further advice.

## 2021-01-25 ENCOUNTER — Encounter: Payer: Self-pay | Admitting: Physician Assistant

## 2021-01-25 ENCOUNTER — Other Ambulatory Visit: Payer: Self-pay

## 2021-01-25 ENCOUNTER — Ambulatory Visit: Payer: Medicare HMO | Admitting: Physician Assistant

## 2021-01-25 VITALS — BP 140/100 | HR 84 | Ht 65.0 in | Wt 271.4 lb

## 2021-01-25 DIAGNOSIS — E119 Type 2 diabetes mellitus without complications: Secondary | ICD-10-CM | POA: Diagnosis not present

## 2021-01-25 DIAGNOSIS — I10 Essential (primary) hypertension: Secondary | ICD-10-CM

## 2021-01-25 DIAGNOSIS — K760 Fatty (change of) liver, not elsewhere classified: Secondary | ICD-10-CM | POA: Diagnosis not present

## 2021-01-25 DIAGNOSIS — E782 Mixed hyperlipidemia: Secondary | ICD-10-CM | POA: Diagnosis not present

## 2021-01-25 DIAGNOSIS — N179 Acute kidney failure, unspecified: Secondary | ICD-10-CM

## 2021-01-25 NOTE — Progress Notes (Signed)
Cardiology Office Note:    Date:  01/26/2021   ID:  [name], DOB [birth year], MRN [id number]  PCP:  Redmond School, [place]  Cardiologist:  Amy Klein, MD  Advanced Practice Provider:  No care team member to display Electrophysiologist:  None   Referring MD: Redmond School, MD   Chief Complaint  Patient presents with  . Follow-up    Seen for Dr. Sallyanne Kuster    History of Present Illness:    Amy [name] is a 55 y.o. female with a hx of hypertension, hyperlipidemia, DM 2, fatty liver disease, GERD, and obesity.  Patient was previously evaluated in 2020 for intermittent chest pain.  EKG and troponin were normal.  CTA obtained in the emergency room did not show any PE.  He was last seen by Dr. Sallyanne Kuster on 09/25/2019 at which time he was doing well.  Diet and exercise were recommended to help with elevated cholesterol.  Based on recent phone note, it appears patient was previously on losartan 100 mg daily however this was switched to hydrochlorothiazide 25 mg times 2 in the morning at her request because she was thought losartan was causing her weight gain.  Since starting on the hydrochlorothiazide, she has been having intermittent weakness.  Blood pressure was elevated, heart rate was mostly in the 110s.  Lab work obtained on 01/17/2021 showed creatinine went up to 2.51 consistent with acute renal insufficiency likely related to hypovolemia.  She was instructed to stop hydrochlorothiazide immediately and start holding the losartan and Metformin as well.  She was switched to amlodipine 5 mg daily for her blood pressure during the meantime.  Dr. Sallyanne Kuster recommended repeat basic metabolic panel and renal artery Doppler to make sure there is no bilateral renal artery stenosis.  Repeat blood work obtained on 01/20/2021 showed creatinine has went down to 1.03.  Hemoglobin A1c obtained on 01/20/2021 was 7.8.  Renal artery Doppler was negative for significant  renal artery disease.  Patient presents today for follow-up.  Based on home blood pressure cuff, her systolic blood pressure has been ranging from 120s to 150s range.  Instead of taking carvedilol 6.25 mg twice a day, she is actually taking carvedilol 6.25 mg every morning.  I instructed her to start taking carvedilol twice a day instead of once a day.  I will remove losartan from her medication list since she is not taking it.  She is still currently holding Metformin and will see her endocrinologist to decide whether to restart her Metformin.  She is concerned with her weight gain.  TSH obtained on 11/29/2020 was 2.88 which was normal.  ALT borderline elevated 45 on the same day.  She asked about keto diet to help with weight loss, I did not recommend this given her history of elevated liver enzyme, fatty liver disease, and cholesterol issue.  She can follow-up with Dr. Sallyanne Kuster in 2 months.    Past Medical History:  Diagnosis Date  . Anemia   . Anxiety    xanax prn  . Blood transfusion without reported diagnosis   . Degenerative disc disease   . Diabetes mellitus without complication ([place])   . Fatty liver   . Fibromyalgia   . GERD (gastroesophageal reflux disease)    prilosec otc-instructed to take dos  . Hot flashes 02/19/2014  . Hypertension    controlled on hctz-bp at pat 145/94  . Neuromuscular disorder ([place])    firbrmylagia  . Obesity   .  Panic attacks   . PONV (postoperative nausea and vomiting)    pt states has not had ponv but has been treated in past with scopolamine, does admit to history of motion sickness, history of vertigo  . Vertigo    recent diagnosis of vertigo-uses otc meclizine  . Weight gain 02/19/2014    Past Surgical History:  Procedure Laterality Date  . ABDOMINAL HYSTERECTOMY    . BREAST REDUCTION SURGERY     2010  . ECTOPIC PREGNANCY SURGERY     2002  . LAPAROSCOPIC ASSISTED VAGINAL HYSTERECTOMY  06/14/2011   Procedure: LAPAROSCOPIC ASSISTED VAGINAL  HYSTERECTOMY;  Surgeon: Luz Lex, MD;  Location: [facility];  Service: Gynecology;  Laterality: N/A;  Abdomen and vagina  . LAPAROSCOPY     2006-for adhesions    Current Medications: Current Meds  Medication Sig  . ALPRAZolam (XANAX) 0.5 MG tablet Take 0.5 mg by mouth 4 (four) times daily as needed for anxiety.   . carvedilol (COREG) 6.25 MG tablet Take 1 tablet (6.25 mg total) by mouth 2 (two) times daily.  . famotidine-calcium carbonate-magnesium hydroxide (PEPCID COMPLETE) 10-800-165 MG chewable tablet Chew 1 tablet by mouth daily as needed.  [name] FLUoxetine (PROZAC) 20 MG capsule Take 20 mg by mouth daily.  . hydrocortisone 2.5 % cream Apply 1 application topically 2 (two) times daily.  . Lidocaine, Anorectal, 5 % CREA Apply topically.  [name] loratadine (CLARITIN) 10 MG tablet Take 10 mg by mouth daily.  [name] loteprednol (LOTEMAX) 0.2 % SUSP 1 drop 4 (four) times daily.  [name] loteprednol (LOTEMAX) 0.5 % ophthalmic suspension 1 DROP 4 TIMES A DAY INTO BOTH EYES  . meclizine (ANTIVERT) 25 MG tablet Take 1 tablet (25 mg total) by mouth 3 (three) times daily as needed for dizziness.  . metFORMIN (GLUCOPHAGE-XR) 500 MG 24 hr tablet Take 500 mg by mouth 2 (two) times daily with a meal. *May take additional tablet in the evening for high blood glucose levels  . [DISCONTINUED] losartan (COZAAR) 100 MG tablet Take 50 mg by mouth daily.     Allergies:   Carbamazepine, Hydromorphone, and Ciprofloxacin   Social History   Socioeconomic History  . Marital status: Married    Spouse name: Not on file  . Number of children: Not on file  . Years of education: Not on file  . Highest education level: Not on file  Occupational History  . Not on file  Tobacco Use  . Smoking status: Never Smoker  . Smokeless tobacco: Never Used  Substance and Sexual Activity  . Alcohol use: No  . Drug use: No  . Sexual activity: Yes    Partners: Male    Birth control/protection: Surgical    Comment: married  Other Topics  Concern  . Not on file  Social History Narrative  . Not on file   Social Determinants of Health   Financial Resource Strain: Not on file  Food Insecurity: Not on file  Transportation Needs: Not on file  Physical Activity: Not on file  Stress: Not on file  Social Connections: Not on file     Family History: The patient's family history includes Anxiety disorder in her mother, paternal grandmother, and paternal uncle; Arthritis in her mother, sister, and sister; Cancer in her maternal grandmother and paternal uncle; Depression in her mother; Diabetes in her father, maternal grandfather, paternal grandfather, and paternal grandmother; Hypertension in her father, maternal grandfather, paternal grandfather, and paternal grandmother; Mood Disorder in her brother and maternal uncle;  Panic disorder in her sister; Psychosis in her maternal uncle.  ROS:   Please see the history of present illness.     All other systems reviewed and are negative.  EKGs/Labs/Other Studies Reviewed:    The following studies were reviewed today:  Renal artery doppler 01/21/2021 Summary:  Renal:    Right: Normal size right kidney. Normal right Resistive Index.     Normal cortical thickness of right kidney. No evidence of     right renal artery stenosis. RRV flow present.  Left: Normal size of left kidney. Normal left Resistive Index.     Normal cortical thickness of the left kidney. 1-59% stenosis     of the left renal artery. LRV flow present. There is a kink     in the vessel at the origin/proximal segment that is likely     causing mildly elevated velocities.  Mesenteric:  Normal Celiac artery and Superior Mesenteric artery findings.    EKG:  EKG is not ordered today.    Recent Labs: 08/29/2020: ALT 44; Hemoglobin 14.4; Platelets 237 01/17/2021: Magnesium 1.9 01/20/2021: BUN 16; Creatinine, Ser 1.03; Potassium 3.8; Sodium 145  Recent Lipid Panel No results found for: CHOL,  TRIG, HDL, CHOLHDL, VLDL, LDLCALC, LDLDIRECT   Risk Assessment/Calculations:       Physical Exam:    VS:  BP (!) 140/100   Pulse 84   Ht 5' 5"  (1.651 m)   Wt 271 lb 6.4 oz (123.1 kg)   LMP 05/01/2011   SpO2 98%   BMI 45.16 kg/m     Wt Readings from Last 3 Encounters:  01/25/21 271 lb 6.4 oz (123.1 kg)  08/29/20 260 lb (117.9 kg)  05/27/20 264 lb (119.7 kg)     GEN:  Well nourished, well developed in no acute distress HEENT: Normal NECK: No JVD; No carotid bruits LYMPHATICS: No lymphadenopathy CARDIAC: RRR, no murmurs, rubs, gallops RESPIRATORY:  Clear to auscultation without rales, wheezing or rhonchi  ABDOMEN: Soft, non-tender, non-distended MUSCULOSKELETAL:  No edema; No deformity  SKIN: Warm and dry NEUROLOGIC:  Alert and oriented x 3 PSYCHIATRIC:  Normal affect   ASSESSMENT:    1. Essential hypertension   2. Mixed hyperlipidemia   3. Controlled type 2 diabetes mellitus without complication, without long-term current use of insulin ([place])   4. AKI (acute kidney injury) ([place])   5. Fatty liver disease, nonalcoholic    PLAN:    In order of problems listed above:  1. Hypertension: She is only taking carvedilol once a day instead of twice a day.  I recommended she start taking it twice a day.  She had a significant acute kidney injury on the hydrochlorothiazide, will avoid any diuretic at this point  2. Hyperlipidemia: Diet and exercise.  Focus on weight loss.  Mild elevated liver enzymes due to fatty liver disease  3. DM2: Managed by primary care provider  4. AKI: Renal function is returning back to baseline after discontinuation of hydrochlorothiazide  5. Fatty liver disease: Focus on weight loss.      Medication Adjustments/Labs and Tests Ordered: Current medicines are reviewed at length with the patient today.  Concerns regarding medicines are outlined above.  No orders of the defined types were placed in this encounter.  No orders of the defined types  were placed in this encounter.   Patient Instructions  Medication Instructions:  MAKE SURE TO TAKE CARVEDILOL TWICE DAILY  *If you need a refill on your cardiac medications before your next appointment,  please call your pharmacy*  Lab Work:   Testing/Procedures:  NONE    NONE  Follow-Up: Your next appointment:  [place] DR C IN [facility]IL  In Person with Amy Klein, MD   At [facility], you and your health needs are our priority.  As part of our continuing mission to provide you with exceptional heart care, we have created designated Provider Care Teams.  These Care Teams include your primary Cardiologist (physician) and Advanced Practice Providers (APPs -  Physician Assistants and Nurse Practitioners) who all work together to provide you with the care you need, when you need it.     Hilbert Corrigan, [place]  01/26/2021 9:49 PM    [facility]

## 2021-01-25 NOTE — Patient Instructions (Signed)
Medication Instructions:  MAKE SURE TO TAKE CARVEDILOL TWICE DAILY  *If you need a refill on your cardiac medications before your next appointment, please call your pharmacy*  Lab Work:   Testing/Procedures:  NONE    NONE  Follow-Up: Your next appointment:  [place]  In Person with Sanda Klein, MD   At [facility], you and your health needs are our priority.  As part of our continuing mission to provide you with exceptional heart care, we have created designated Provider Care Teams.  These Care Teams include your primary Cardiologist (physician) and Advanced Practice Providers (APPs -  Physician Assistants and Nurse Practitioners) who all work together to provide you with the care you need, when you need it.

## 2021-01-26 ENCOUNTER — Encounter: Payer: Self-pay | Admitting: Physician Assistant

## 2021-01-27 ENCOUNTER — Ambulatory Visit: Payer: Self-pay | Admitting: Psychiatry

## 2021-01-31 DIAGNOSIS — E1169 Type 2 diabetes mellitus with other specified complication: Secondary | ICD-10-CM | POA: Diagnosis not present

## 2021-01-31 DIAGNOSIS — Z7984 Long term (current) use of oral hypoglycemic drugs: Secondary | ICD-10-CM | POA: Diagnosis not present

## 2021-01-31 DIAGNOSIS — E1165 Type 2 diabetes mellitus with hyperglycemia: Secondary | ICD-10-CM | POA: Diagnosis not present

## 2021-01-31 DIAGNOSIS — Z6841 Body Mass Index (BMI) 40.0 and over, adult: Secondary | ICD-10-CM | POA: Diagnosis not present

## 2021-01-31 DIAGNOSIS — E669 Obesity, unspecified: Secondary | ICD-10-CM | POA: Diagnosis not present

## 2021-03-08 ENCOUNTER — Encounter: Payer: Self-pay | Admitting: Psychiatry

## 2021-03-08 ENCOUNTER — Ambulatory Visit (INDEPENDENT_AMBULATORY_CARE_PROVIDER_SITE_OTHER): Payer: Medicare HMO | Admitting: Psychiatry

## 2021-03-08 ENCOUNTER — Other Ambulatory Visit: Payer: Self-pay

## 2021-03-08 DIAGNOSIS — F39 Unspecified mood [affective] disorder: Secondary | ICD-10-CM

## 2021-03-08 DIAGNOSIS — R69 Illness, unspecified: Secondary | ICD-10-CM | POA: Diagnosis not present

## 2021-03-08 MED ORDER — DIVALPROEX SODIUM ER 250 MG PO TB24: ORAL_TABLET | ORAL | 1 refills | Status: DC

## 2021-03-08 NOTE — Progress Notes (Signed)
Amy [name] [id number] October 12, 1966 55 y.o.  Subjective:   Patient ID:  Amy [name] is a 55 y.o. (DOB 1966-06-07) female.  Chief Complaint:  Chief Complaint  Patient presents with  . Anxiety  . Depression  . Other    Anger, irritability  . Paranoid    HPI Amy [name] presents to the office today for follow-up of anxiety, mood disturbance, paranoia, and insomnia. Her husband has been out of work since early November. She lost her health insurance and had to get new insurance.   She reports that her sleep has been disrupted due to husband being in severe pain and not sleeping. She reports going to bed at 2 am and awakening at 4 am. Has not been having nightmares due to limited sleep.   She reports that her mood has been "so angry" and at times has said things she regrets to her husband and has kicked a table, thrown things, and punched a hole in the wall. She reports that husband has commented that when she feels attacked that she is ready to fight. She reports that at times she has "blacked out" when very angry with others. "I hate myself at this point." She describes mood lability.  Depressed mood. "Everything seems to be out of control." She reports high levels of anxiety and panic attacks. She reports that she has anxiety in response to germs. She reports that she has been having fibromyalgia flares in response to stress. She reports that she has had decreased appetite with Ozempic. She reports that her energy and motivation have been low and her family has had to come to help with things in their yard. She reports difficulty with concentration and will postpone tasks for as long as possible. She reports that she completed a medical form incorrectly due to poor concentration. She reports SI a few months ago in the context of severe stress. Denies SI.   She reports that her husband has told her that she drives recklessly. Denies excessive spending. She reports racing thoughts. She  reports periods of excessive talking and other times does not want to talk with anyone.   She reports that paranoia has been worse. She reports that yesterday she kept hearing someone knock and husband was telling her no one was there.   In February she was having chest pain and difficulty breathing. She was told that she was very dehydrated and to stop all mediations and a renal ultrasound.   Now on disability.   Past Medication Trials: Abilify- Irritable, restless, nausea Vraylar- akathisia Latuda- Effective, then stopped working Risperidone- "felt weird."  Seroquel Lamotrigine Carbamazepine- SJS Topamax-Sleeplessness, irritability Prozac- Somewhat helpful for mood and anxiety. Trileptal Topamax Sertraline- Limited improvement in mood and anxiety s/s.  Prozac Propranolol- effective for akathisia. May have been helpful for anxiety. Buspar- "did not like the way it made me feel" Xanax Gabapentin- Adverse effects, drowsiness  PHQ2-9   Flowsheet Row Office Visit from 11/20/2018 in [facility]  PHQ-2 Total Score 0       Review of Systems:  Review of Systems  Gastrointestinal: Positive for nausea.  Musculoskeletal: Positive for myalgias. Negative for gait problem.  Neurological: Positive for tremors.  Psychiatric/Behavioral:       Please refer to HPI    Medications: I have reviewed the patient's current medications.  Current Outpatient Medications  Medication Sig Dispense Refill  . ALPRAZolam (XANAX) 0.5 MG tablet Take 0.5 mg by mouth 4 (four) times daily as needed  for anxiety.     . carvedilol (COREG) 6.25 MG tablet Take 1 tablet (6.25 mg total) by mouth 2 (two) times daily. 180 tablet 3  . divalproex (DEPAKOTE ER) 250 MG 24 hr tablet Take 1 tablet at bedtime for one week, then increase to 2 tablets at bedtime for one week, then increase to 3 tablets at bedtime 45 tablet 1  . famotidine-calcium carbonate-magnesium hydroxide (PEPCID COMPLETE) 10-800-165 MG  chewable tablet Chew 1 tablet by mouth daily as needed.    . loratadine (CLARITIN) 10 MG tablet Take 10 mg by mouth daily.    . metFORMIN (GLUCOPHAGE-XR) 500 MG 24 hr tablet Take 500 mg by mouth 2 (two) times daily with a meal. *May take additional tablet in the evening for high blood glucose levels    . Semaglutide,0.25 or 0.5MG/DOS, 2 MG/1.5ML SOPN Inject into the skin.    . hydrocortisone 2.5 % cream Apply 1 application topically 2 (two) times daily.    . Lidocaine, Anorectal, 5 % CREA Apply topically.    [name] loteprednol (LOTEMAX) 0.2 % SUSP 1 drop 4 (four) times daily.    [name] loteprednol (LOTEMAX) 0.5 % ophthalmic suspension 1 DROP 4 TIMES A DAY INTO BOTH EYES    . meclizine (ANTIVERT) 25 MG tablet Take 1 tablet (25 mg total) by mouth 3 (three) times daily as needed for dizziness. (Patient not taking: Reported on 03/08/2021) 30 tablet 0   No current facility-administered medications for this visit.    Medication Side Effects: Other: N/A  Allergies:  Allergies  Allergen Reactions  . Carbamazepine   . Hydromorphone Nausea And Vomiting  . Ciprofloxacin Rash    Past Medical History:  Diagnosis Date  . Anemia   . Anxiety    xanax prn  . Blood transfusion without reported diagnosis   . Degenerative disc disease   . Diabetes mellitus without complication ([place])   . Fatty liver   . Fibromyalgia   . GERD (gastroesophageal reflux disease)    prilosec otc-instructed to take dos  . Hot flashes 02/19/2014  . Hypertension    controlled on hctz-bp at pat 145/94  . Neuromuscular disorder ([place])    firbrmylagia  . Obesity   . Panic attacks   . PONV (postoperative nausea and vomiting)    pt states has not had ponv but has been treated in past with scopolamine, does admit to history of motion sickness, history of vertigo  . Vertigo    recent diagnosis of vertigo-uses otc meclizine  . Weight gain 02/19/2014    Family History  Problem Relation Age of Onset  . Cancer Paternal Uncle         liver   . Cancer Maternal Grandmother        throat  . Arthritis Mother   . Anxiety disorder Mother   . Depression Mother   . Diabetes Father   . Hypertension Father   . Arthritis Sister   . Diabetes Maternal Grandfather   . Hypertension Maternal Grandfather   . Diabetes Paternal Grandmother   . Hypertension Paternal Grandmother   . Anxiety disorder Paternal Grandmother   . Diabetes Paternal Grandfather   . Hypertension Paternal Grandfather   . Mood Disorder Brother   . Arthritis Sister   . Panic disorder Sister   . Psychosis Maternal Uncle   . Mood Disorder Maternal Uncle   . Anxiety disorder Paternal Uncle     Social History   Socioeconomic History  . Marital status: Married  Spouse name: Not on file  . Number of children: Not on file  . Years of education: Not on file  . Highest education level: Not on file  Occupational History  . Not on file  Tobacco Use  . Smoking status: Never Smoker  . Smokeless tobacco: Never Used  Substance and Sexual Activity  . Alcohol use: No  . Drug use: No  . Sexual activity: Yes    Partners: Male    Birth control/protection: Surgical    Comment: married  Other Topics Concern  . Not on file  Social History Narrative  . Not on file   Social Determinants of Health   Financial Resource Strain: Not on file  Food Insecurity: Not on file  Transportation Needs: Not on file  Physical Activity: Not on file  Stress: Not on file  Social Connections: Not on file  Intimate Partner Violence: Not on file    Past Medical History, Surgical history, Social history, and Family history were reviewed and updated as appropriate.   Please see review of systems for further details on the patient's review from today.   Objective:   Physical Exam:  LMP 05/01/2011   Physical Exam Constitutional:      General: She is not in acute distress. Musculoskeletal:        General: No deformity.  Neurological:     Mental Status: She is alert and  oriented to person, place, and time.     Coordination: Coordination normal.  Psychiatric:        Attention and Perception: Attention and perception normal. She does not perceive auditory or visual hallucinations.        Mood and Affect: Mood is anxious and depressed. Affect is labile and tearful. Affect is not blunt, angry or inappropriate.        Speech: Speech normal.        Behavior: Behavior normal. Behavior is cooperative.        Thought Content: Thought content is paranoid. Thought content is not delusional. Thought content does not include homicidal or suicidal ideation. Thought content does not include homicidal or suicidal plan.        Cognition and Memory: Cognition and memory normal.        Judgment: Judgment normal.     Comments: Insight intact     Lab Review:     Component Value Date/Time   NA 145 (H) 01/20/2021 1132   K 3.8 01/20/2021 1132   CL 101 01/20/2021 1132   CO2 23 01/20/2021 1132   GLUCOSE 148 (H) 01/20/2021 1132   GLUCOSE 258 (H) 08/29/2020 0615   BUN 16 01/20/2021 1132   CREATININE 1.03 (H) 01/20/2021 1132   CREATININE 0.69 02/19/2014 1542   CALCIUM 9.6 01/20/2021 1132   PROT 7.2 08/29/2020 0615   ALBUMIN 3.7 08/29/2020 0615   AST 45 (H) 08/29/2020 0615   ALT 44 08/29/2020 0615   ALKPHOS 115 08/29/2020 0615   BILITOT 0.8 08/29/2020 0615   GFRNONAA 63 01/20/2021 1132   GFRAA 72 01/20/2021 1132       Component Value Date/Time   WBC 8.0 08/29/2020 0615   RBC 5.07 08/29/2020 0615   HGB 14.4 08/29/2020 0615   HCT 44.6 08/29/2020 0615   PLT 237 08/29/2020 0615   MCV 88.0 08/29/2020 0615   MCH 28.4 08/29/2020 0615   MCHC 32.3 08/29/2020 0615   RDW 14.0 08/29/2020 0615   LYMPHSABS 2.6 08/29/2020 0615   MONOABS 0.6 08/29/2020 0615   EOSABS  0.3 08/29/2020 0615   BASOSABS 0.1 08/29/2020 0615    No results found for: POCLITH, LITHIUM   No results found for: PHENYTOIN, PHENOBARB, VALPROATE, CBMZ   .res Assessment: Plan:    Patient seen for 30  minutes and time spent counseling patient regarding current signs and symptoms.  Patient identifies anger, irritability, and mood lability as current chief complaints.  Discussed potential benefits, risks, and side effects of Depakote ER for mood stabilization.  Discussed that Depakote may also be helpful for insomnia and anxiety.  Discussed starting one medication at a time due to her history of adverse reactions to medication.  Also discussed starting with low-dose and gradually increasing based upon tolerability. Will start Depakote ER 250 mg at bedtime for 1 week, then increase to 500 mg at bedtime for 1 week, then increase to 750 mg at bedtime for mood stabilization. Patient to follow-up in 1 to 2 months or sooner if clinically indicated. Patient advised to contact office with any questions, adverse effects, or acute worsening in signs and symptoms.   Hailley was seen today for anxiety, depression, other and paranoid.  Diagnoses and all orders for this visit:  Episodic mood disorder (HCC) -     divalproex (DEPAKOTE ER) 250 MG 24 hr tablet; Take 1 tablet at bedtime for one week, then increase to 2 tablets at bedtime for one week, then increase to 3 tablets at bedtime     Please see After Visit Summary for patient specific instructions.  Future Appointments  Date Time Provider [place]  03/25/2021  2:40 PM Croitoru, Cogdell, MD [facility]  04/22/2021 10:00 AM Thayer Headings, PMHNP [facility] None    No orders of the defined types were placed in this encounter.   -------------------------------

## 2021-03-17 ENCOUNTER — Telehealth: Payer: Self-pay | Admitting: Cardiovascular Disease

## 2021-03-17 NOTE — Telephone Encounter (Signed)
Pt c/o BP issue: STAT if pt c/o blurred vision, one-sided weakness or slurred speech  1. What are your last 5 BP readings?  134/88 149/93 155/99 160/100 155/98 147/107 153/100 154/101  2. Are you having any other symptoms (ex. Dizziness, headache, blurred vision, passed out)? Headache on yesterday and last night  3. What is your BP issue?  PT is calling with concerns about her BP. She states she usually has to take Ozempic for her diabetes once a week,but doesn't fill that it should raise her BP that high.She also states she took a BP pill at 10 or 11 last night but she is still not feeling well.Please advise

## 2021-03-17 NOTE — Telephone Encounter (Signed)
Spoke with patient and she was started Ozempic about 7 weeks ago but was increased this week After taking injection this week she developed nausea and vomiting, she will not be continuing She did vomit both Tuesday and Wednesday about 30 minutes or less after taking blood pressure medications Headache last couple days with elevated blood pressures, yesterday 169/104, 150/103 Did take her blood pressure medication last night without issues Today blood pressure 124/85  Advised patient to continue current regimen and monitoring blood pressure  Keep visit next week as scheduled and call back if blood pressure starts to go back up Per patient blood pressures prior to Ozempic were doing well. Will forward to Dr Lorenza [place] for review

## 2021-03-17 NOTE — Telephone Encounter (Signed)
Thanks. Suspect BP elevation was due to some loss of BP meds absorption with vomiting (a known Ozempic side effect). Would expect a stable BP going forward. Will discuss at office visit.

## 2021-03-17 NOTE — Telephone Encounter (Signed)
Patient is calling to follow up. She states her BP is better, but she did not provide updated readings. She would still like a call back to discuss.

## 2021-03-24 ENCOUNTER — Telehealth: Payer: Self-pay | Admitting: Cardiovascular Disease

## 2021-03-24 NOTE — Telephone Encounter (Signed)
Pt c/o BP issue: STAT if pt c/o blurred vision, one-sided weakness or slurred speech  1. What are your last 5 BP readings? 179/107  2. Are you having any other symptoms (ex. Dizziness, headache, blurred vision, passed out)? no  3. What is your BP issue? Patient states her BP running high patient called the doctor on call last night. Patient has a apt tomorrow. She would like to speak with a nurse.

## 2021-03-24 NOTE — Telephone Encounter (Signed)
Returned call to patient of Dr. Loletha Grayer BP this morning is 179/107, 165/109 She called on-call provider last night -- was advised at 12:30am that she could take extra carvedilol (after taking BID, as prescribed) -- no note in epic  She just recently started taking carvedilol twice daily (she had been only taking daily, as her BP was "good in the evening") Explained that if she does not take her evening med, her BP would be higher in the morning She said her endocrinologist advised her OK to hold her evening dose if BP is good   She stopped ozempic last week She also had been off prozac, but recently started it back and then BP went up, so she decided to stop it again -- this is not on our med list Advised against this practice of stopping/starting this medication on her own   She has not been good about dietary choices and may be consuming more salt/sodium  Advised to monitor BP, advised of ED precautions, and to bring BP cuff and medications to her visit tomorrow, as we need to confirm our list is accurate with what she is taking at home.   Routed to MD as Juluis Rainier

## 2021-03-24 NOTE — Telephone Encounter (Signed)
Carvedilol should always be a twice daily medication.

## 2021-03-25 ENCOUNTER — Encounter: Payer: Self-pay | Admitting: Cardiovascular Disease

## 2021-03-25 ENCOUNTER — Ambulatory Visit (INDEPENDENT_AMBULATORY_CARE_PROVIDER_SITE_OTHER): Payer: Medicare HMO | Admitting: Cardiovascular Disease

## 2021-03-25 ENCOUNTER — Other Ambulatory Visit: Payer: Self-pay

## 2021-03-25 VITALS — BP 140/78 | HR 87 | Ht 65.0 in | Wt 266.6 lb

## 2021-03-25 DIAGNOSIS — I10 Essential (primary) hypertension: Secondary | ICD-10-CM | POA: Diagnosis not present

## 2021-03-25 DIAGNOSIS — E785 Hyperlipidemia, unspecified: Secondary | ICD-10-CM

## 2021-03-25 DIAGNOSIS — Z79899 Other long term (current) drug therapy: Secondary | ICD-10-CM

## 2021-03-25 DIAGNOSIS — K7581 Nonalcoholic steatohepatitis (NASH): Secondary | ICD-10-CM

## 2021-03-25 DIAGNOSIS — E119 Type 2 diabetes mellitus without complications: Secondary | ICD-10-CM

## 2021-03-25 DIAGNOSIS — N179 Acute kidney failure, unspecified: Secondary | ICD-10-CM | POA: Diagnosis not present

## 2021-03-25 MED ORDER — HYDROCHLOROTHIAZIDE 12.5 MG PO CAPS: 12.5000 mg | ORAL_CAPSULE | Freq: Every day | ORAL | 1 refills | Status: DC

## 2021-03-25 NOTE — Progress Notes (Signed)
Cardiology Office Note:    Date:  03/26/2021   ID:  Amy [name], DOB Dec 14, 1965, MRN [id number]  PCP:  Redmond School, MD  Cardiologist:  Sanda Klein, MD  Electrophysiologist:  None   Referring MD: Redmond School, MD   Chief Complaint  Patient presents with  . Hypertension    History of Present Illness:    Amy [name] is a 55 y.o. female with a hx of severe obesity, HTN, mixed hyperlipidemia, type 2 diabetes mellitus, recent diagnosis of fatty liver, gastroesophageal reflux disease.  She started taking hydrochlorothiazide 25 mg since she had complaints of swelling in her hands and feet, but then in February 2022, she had markedly elevated creatinine at 2.5.  Had some chest discomfort at the time that resolved with cessation of the hydrochlorothiazide and normalization of kidney function.  She did not tolerate amlodipine since this caused ankle swelling and flushing.  In early April 2022, she did not tolerate Ozempic due to nausea and vomiting.  She had a burst of hypertension for couple of days, probably beta-blocker rebound since she was throwing up her blood pressure medications.  She has been taking carvedilol, but for a long time was only taking it once daily.  Glycemic control is not terrible, but is suboptimal with a hemoglobin A1c of 7.8%, and her triglycerides are up to 241, borderline low HDL at 40.  LDL is 120.  She is not taking statins.  CT angiography August 2020 for chest pain did not show pulmonary embolism and was also significant for the complete absence of atherosclerotic calcification in her coronary arteries or thoracic aorta.  CXR was unrevealing and she has a normal gallbladder on right upper quadrant ultrasound.  Her potassium was very low at 2.7.  Hydrochlorothiazide was discontinued and she started taking losartan. Both her ALT and AST are elevated < 2xULN since 2014 when she had an abdominal ultrasound that showed fatty liver. No evidence of renal artery  stenosis on Duplex US Feb 2022.  Past Medical History:  Diagnosis Date  . Anemia   . Anxiety    xanax prn  . Blood transfusion without reported diagnosis   . Degenerative disc disease   . Diabetes mellitus without complication ([place])   . Fatty liver   . Fibromyalgia   . GERD (gastroesophageal reflux disease)    prilosec otc-instructed to take dos  . Hot flashes 02/19/2014  . Hypertension    controlled on hctz-bp at pat 145/94  . Neuromuscular disorder ([place])    firbrmylagia  . Obesity   . Panic attacks   . PONV (postoperative nausea and vomiting)    pt states has not had ponv but has been treated in past with scopolamine, does admit to history of motion sickness, history of vertigo  . Vertigo    recent diagnosis of vertigo-uses otc meclizine  . Weight gain 02/19/2014    Past Surgical History:  Procedure Laterality Date  . ABDOMINAL HYSTERECTOMY    . BREAST REDUCTION SURGERY     2010  . ECTOPIC PREGNANCY SURGERY     2002  . LAPAROSCOPIC ASSISTED VAGINAL HYSTERECTOMY  06/14/2011   Procedure: LAPAROSCOPIC ASSISTED VAGINAL HYSTERECTOMY;  Surgeon: Luz Lex, MD;  Location: [facility];  Service: Gynecology;  Laterality: N/A;  Abdomen and vagina  . LAPAROSCOPY     2006-for adhesions    Current Medications: Current Meds  Medication Sig  . ALPRAZolam (XANAX) 0.5 MG tablet Take 0.5 mg by mouth 4 (four) times daily  as needed for anxiety.   . carvedilol (COREG) 6.25 MG tablet Take 1 tablet (6.25 mg total) by mouth 2 (two) times daily.  [name] FLUoxetine (PROZAC) 20 MG capsule Take 20 mg by mouth daily.  . hydrochlorothiazide (MICROZIDE) 12.5 MG capsule Take 1 capsule (12.5 mg total) by mouth daily.  [name] loratadine (CLARITIN) 10 MG tablet Take 10 mg by mouth daily.  . metFORMIN (GLUCOPHAGE-XR) 500 MG 24 hr tablet Take 500 mg by mouth 2 (two) times daily with a meal. *May take additional tablet in the evening for high blood glucose levels  . Semaglutide,0.25 or 0.5MG/DOS, 2 MG/1.5ML SOPN  Inject into the skin.  . [DISCONTINUED] divalproex (DEPAKOTE ER) 250 MG 24 hr tablet Take 1 tablet at bedtime for one week, then increase to 2 tablets at bedtime for one week, then increase to 3 tablets at bedtime  . [DISCONTINUED] famotidine-calcium carbonate-magnesium hydroxide (PEPCID COMPLETE) 10-800-165 MG chewable tablet Chew 1 tablet by mouth daily as needed.  . [DISCONTINUED] hydrocortisone 2.5 % cream Apply 1 application topically 2 (two) times daily.  . [DISCONTINUED] Lidocaine, Anorectal, 5 % CREA Apply topically.  . [DISCONTINUED] loteprednol (LOTEMAX) 0.2 % SUSP 1 drop 4 (four) times daily.  . [DISCONTINUED] loteprednol (LOTEMAX) 0.5 % ophthalmic suspension 1 DROP 4 TIMES A DAY INTO BOTH EYES  . [DISCONTINUED] meclizine (ANTIVERT) 25 MG tablet Take 1 tablet (25 mg total) by mouth 3 (three) times daily as needed for dizziness.     Allergies:   Carbamazepine, Hydromorphone, and Ciprofloxacin   Social History   Socioeconomic History  . Marital status: Married    Spouse name: Not on file  . Number of children: Not on file  . Years of education: Not on file  . Highest education level: Not on file  Occupational History  . Not on file  Tobacco Use  . Smoking status: Never Smoker  . Smokeless tobacco: Never Used  Substance and Sexual Activity  . Alcohol use: No  . Drug use: No  . Sexual activity: Yes    Partners: Male    Birth control/protection: Surgical    Comment: married  Other Topics Concern  . Not on file  Social History Narrative  . Not on file   Social Determinants of Health   Financial Resource Strain: Not on file  Food Insecurity: Not on file  Transportation Needs: Not on file  Physical Activity: Not on file  Stress: Not on file  Social Connections: Not on file     Family History: The patient's family history includes Anxiety disorder in her mother, paternal grandmother, and paternal uncle; Arthritis in her mother, sister, and sister; Cancer in her  maternal grandmother and paternal uncle; Depression in her mother; Diabetes in her father, maternal grandfather, paternal grandfather, and paternal grandmother; Hypertension in her father, maternal grandfather, paternal grandfather, and paternal grandmother; Mood Disorder in her brother and maternal uncle; Panic disorder in her sister; Psychosis in her maternal uncle.  ROS:   Please see the history of present illness.     All other systems reviewed and are negative.  EKGs/Labs/Other Studies Reviewed:    The following studies were reviewed today: Renal duplex ultrasound 01/21/2021   Right: Normal size right kidney. Normal right Resistive Index.     Normal cortical thickness of right kidney. No evidence of     right renal artery stenosis. RRV flow present.  Left: Normal size of left kidney. Normal left Resistive Index.     Normal cortical thickness of the  left kidney. 1-59% stenosis     of the left renal artery. LRV flow present. There is a kink     in the vessel at the origin/proximal segment that is likely     causing mildly elevated velocities.  Mesenteric:  Normal Celiac artery and Superior Mesenteric artery findings.  Patent IVC.   EKG:  EKG is ordered today.  It is very similar to previous tracings and shows normal sinus rhythm, normal except for slight delay in R wave progression across the anterior precordium.  QTc 450 ms.  Recent Labs: 08/29/2020: ALT 44; Hemoglobin 14.4; Platelets 237 01/17/2021: Magnesium 1.9 01/20/2021: BUN 16; Creatinine, Ser 1.03; Potassium 3.8; Sodium 145  Recent Lipid Panel No results found for: CHOL, TRIG, HDL, CHOLHDL, VLDL, LDLCALC, LDLDIRECT July 30, 2019 total cholesterol 216, HDL 43, LDL 125, triglycerides 241 Hemoglobin A1c 6.6% Hemoglobin 15.6, creatinine 0.7, potassium 3.8, ALT 69 Physical Exam:    VS:  BP 140/78   Pulse 87   Ht 5' 5"  (1.651 m)   Wt 266 lb 9.6 oz (120.9 kg)   LMP 05/01/2011   SpO2 98%   BMI  44.36 kg/m     Wt Readings from Last 3 Encounters:  03/25/21 266 lb 9.6 oz (120.9 kg)  01/25/21 271 lb 6.4 oz (123.1 kg)  08/29/20 260 lb (117.9 kg)      General: Alert, oriented x3, no distress, morbidly obese Head: no evidence of trauma, PERRL, EOMI, no exophtalmos or lid lag, no myxedema, no xanthelasma; normal ears, nose and oropharynx Neck: normal jugular venous pulsations and no hepatojugular reflux; brisk carotid pulses without delay and no carotid bruits Chest: clear to auscultation, no signs of consolidation by percussion or palpation, normal fremitus, symmetrical and full respiratory excursions Cardiovascular: normal position and quality of the apical impulse, regular rhythm, normal first and second heart sounds, no murmurs, rubs or gallops Abdomen: no tenderness or distention, no masses by palpation, no abnormal pulsatility or arterial bruits, normal bowel sounds, no hepatosplenomegaly Extremities: no clubbing, cyanosis or edema; 2+ radial, ulnar and brachial pulses bilaterally; 2+ right femoral, posterior tibial and dorsalis pedis pulses; 2+ left femoral, posterior tibial and dorsalis pedis pulses; no subclavian or femoral bruits Neurological: grossly nonfocal Psych: Normal mood and affect   ASSESSMENT:    1. Essential hypertension   2. AKI (acute kidney injury) ([place])   3. Dyslipidemia (high LDL; low HDL)   4. NASH (nonalcoholic steatohepatitis)   5. Controlled type 2 diabetes mellitus without complication, without long-term current use of insulin ([place])   6. Medication management    PLAN:    In order of problems listed above:   1. HTN: She does not like the losartan since she is concerned that contributed to her weight gain.  Intolerant of calcium channel blockers due to edema and flushing.  Will recommend carvedilol twice daily and a lower dose of hydrochlorothiazide tolerated without problems.  Important to stay well-hydrated.   2. AKI: We will try to avoid the  combination of RAAS inhibitors and diuretics due to her tendency to develop prerenal azotemia/renal insufficiency.  I asked her to report blood pressure readings in a couple of weeks via MyChart. Recheck labs in a month. 3. HLP: Typical lipid profile suggestive of the metabolic syndrome/insulin resistance with low HDL, elevated triglycerides and likely adverse small dense LDL particle profile.  She is still concerned about taking statins, due to the liver test abnormalities related to her fatty liver.  She is worried that losartan  might be causing weight gain, since it is temporally associated with her increase in weight, although the relationship is highly unlikely.  Reviewed the fact that many of her health problems including the adverse lipid profile, the fatty liver and her diabetes would all improve substantially if she lost a lot of weight.  Should even consider bariatric surgery, if conservative efforts at weight loss are not successful. 4. NASH: No evidence of parenchymal liver insufficiency or structural changes other than fatty liver.  She does not drink alcohol.  Weight loss is the major way to deal with this. 5. DM: Fair, but suboptimal glycemic control.  Recommend increase physical exercise and weight loss.    Medication Adjustments/Labs and Tests Ordered: Current medicines are reviewed at length with the patient today.  Concerns regarding medicines are outlined above.  Orders Placed This Encounter  Procedures  . Basic metabolic panel  . EKG 12-Lead   Meds ordered this encounter  Medications  . hydrochlorothiazide (MICROZIDE) 12.5 MG capsule    Sig: Take 1 capsule (12.5 mg total) by mouth daily.    Dispense:  90 capsule    Refill:  1    Patient Instructions  Medication Instructions:  Begin taking hydrochlorothiazide 12.33m once daily   *If you need a refill on your cardiac medications before your next appointment, please call your pharmacy*   Lab Work: BMet, to be drawn in  1 month.   If you have labs (blood work) drawn today and your tests are completely normal, you will receive your results only by: .Marland KitchenMyChart Message (if you have MyChart) OR . A paper copy in the mail If you have any lab test that is abnormal or we need to change your treatment, we will call you to review the results.   Testing/Procedures: None ordered  Follow-Up: [facility] you and your health needs are our priority.  As part of our continuing mission to provide you with exceptional heart care, we have created designated Provider Care Teams.  These Care Teams include your primary Cardiologist (physician) and Advanced Practice Providers (APPs -  Physician Assistants and Nurse Practitioners) who all work together to provide you with the care you need, when you need it.  We recommend signing up for the patient portal called "MyChart".  Sign up information is provided on this After Visit Summary.  MyChart is used to connect with patients for Virtual Visits (Telemedicine).  Patients are able to view lab/test results, encounter notes, upcoming appointments, etc.  Non-urgent messages can be sent to your provider as well.   To learn more about what you can do with MyChart, go to [email or web address]    Your next appointment:   6 month(s)  The format for your next appointment:   In Person  Provider:   MSanda Klein MD   Other Instructions Take daily BP readings, send to Dr. CSallyanne Kustervia MHubbellin 2 weeks.      Signed, MSanda Klein MD  03/26/2021 11:41 AM    [place]

## 2021-03-25 NOTE — Telephone Encounter (Signed)
Patient had an appointment on 03/25/21

## 2021-03-25 NOTE — Patient Instructions (Signed)
Medication Instructions:  Begin taking hydrochlorothiazide 12.47m once daily   *If you need a refill on your cardiac medications before your next appointment, please call your pharmacy*   Lab Work: BMet, to be drawn in 1 month.   If you have labs (blood work) drawn today and your tests are completely normal, you will receive your results only by: .Marland KitchenMyChart Message (if you have MyChart) OR . A paper copy in the mail If you have any lab test that is abnormal or we need to change your treatment, we will call you to review the results.   Testing/Procedures: None ordered  Follow-Up: [facility] you and your health needs are our priority.  As part of our continuing mission to provide you with exceptional heart care, we have created designated Provider Care Teams.  These Care Teams include your primary Cardiologist (physician) and Advanced Practice Providers (APPs -  Physician Assistants and Nurse Practitioners) who all work together to provide you with the care you need, when you need it.  We recommend signing up for the patient portal called "MyChart".  Sign up information is provided on this After Visit Summary.  MyChart is used to connect with patients for Virtual Visits (Telemedicine).  Patients are able to view lab/test results, encounter notes, upcoming appointments, etc.  Non-urgent messages can be sent to your provider as well.   To learn more about what you can do with MyChart, go to [email or web address]    Your next appointment:   6 month(s)  The format for your next appointment:   In Person  Provider:   MSanda Klein MD   Other Instructions Take daily BP readings, send to Dr. CSallyanne Kustervia MBlainein 2 weeks.

## 2021-03-26 ENCOUNTER — Encounter: Payer: Self-pay | Admitting: Cardiovascular Disease

## 2021-03-28 ENCOUNTER — Telehealth: Payer: Self-pay | Admitting: Cardiovascular Disease

## 2021-03-28 NOTE — Telephone Encounter (Signed)
Pt c/o medication issue:  1. Name of Medication HCTZ 12.5 mg  2. How are you currently taking this medication (dosage and times per day)? 1 time a day  3. Are you having a reaction (difficulty breathing--STAT)? no  4. What is your medication issue? *it cause her to sweat so much- she would like not to take it, if is okay with DR C

## 2021-03-28 NOTE — Telephone Encounter (Signed)
Please ask her to give it at least a couple of weeks.

## 2021-03-28 NOTE — Telephone Encounter (Addendum)
Spoke to patient. Patient states since starting this medication HCTZ and her Prozac last Friday. She has been experiencing sweating mainly her back . It occurred yesterday- Sunday. She had to ask her husband bout  Saturday .  Patient informed her shed did complain of being hot and sweaty . She states she has not had the episode today.\  Patient states takes both medication at the same time in the morning around about 7 to 8 am. Patient states the episodes started  9 or 10 am on Sunday while at church.   RN informed patient it maybe a happen stance that sweating occurred since she only really noticed it on Sunday. ( it was warm over the weekend).  Patient wanted to know if she can stop taking the HCTZ. RN informed patient that Dr Sallyanne Kuster probably would want her continue for little bit longer-but will defer to Dr Sallyanne Kuster for furtherinstruction

## 2021-03-28 NOTE — Telephone Encounter (Signed)
Spoke to patient - instruction given to  Try for at least another 2 weeks  - document if symptoms occur and contact office if can not tolerate Patient verbalized understanding.

## 2021-04-08 ENCOUNTER — Telehealth: Payer: Self-pay | Admitting: Internal Medicine

## 2021-04-08 NOTE — Telephone Encounter (Signed)
Patient called with generalized symptoms of malaise and joint discomfort with concern she was dehydrated from here diuretic.  She wanted to stop it.  BP has been reasonably been controlled.  She will hold hctz and montior bp and call office on Monday. She will increase potassium po intake of fruits and veggies.   [place]

## 2021-04-11 ENCOUNTER — Telehealth: Payer: Self-pay | Admitting: Cardiovascular Disease

## 2021-04-11 NOTE — Telephone Encounter (Signed)
Pt c/o medication issue: 1. Name of Medication: Hydrochlorothiazide 12.5   2. How are you currently taking this medication (dosage and times per day)? One tap once a day  3. Are you having a reaction (difficulty breathing--STAT)?  Patient states her whole body is cramping very bad 4. What is your medication issue? Patient did not take the medication in 2 days. Please advise

## 2021-04-11 NOTE — Telephone Encounter (Signed)
Returned the call to the patient. While on the phone her blood pressure was 151/91. She has been advised to restart the HCTZ and call us back next week with blood pressure readings. She will get follow up labs after being on the HCTZ for at least a week.

## 2021-04-11 NOTE — Telephone Encounter (Signed)
The BP is quite high. I recommend restarting the HCTZ. Would wait for a week of uninterrupted HCTZ before we check labs, otherwise difficult to know what they mean. Prefer PCP check for Lyme dz.

## 2021-04-11 NOTE — Telephone Encounter (Signed)
Spoke to patient . She states she has been feeling  Achy and pain  For 3 to 4 days Thursday , friday,s atrurday. Unable the get out of bed until yesterday -  -( muscle join pain, right hand ,wrist-aching, weak ) patient states she does no if the HCTZ is causing the  Issue, but she wants to know if she should continue with medication or stop and have lab work as prescribed  Patient states she [facility] on Friday. She did take HCTZ on Friday. She has not had medication Saturday, Sunday,  Monday. Per patient she states blood pressure and pulse has been fine did not give any numbers . Patient did check blood pressure while on the phone - b/p 150 /94 , pulse 74.  The patient states she did have a tick embedded in her back the last part of April - her husband  Removed - she did not seek any medical  Advice at that time. She states if she has to get labs now  would Dr Sallyanne Kuster also order for lyme disease as well.  RN  Informed patient will defer to Dr Sallyanne Kuster-  ( probably need to contact primary for lyme disease testing)

## 2021-04-18 DIAGNOSIS — Z79899 Other long term (current) drug therapy: Secondary | ICD-10-CM | POA: Diagnosis not present

## 2021-04-18 DIAGNOSIS — I10 Essential (primary) hypertension: Secondary | ICD-10-CM | POA: Diagnosis not present

## 2021-04-19 LAB — BASIC METABOLIC PANEL
BUN/Creatinine Ratio: 14 (ref 9–23)
BUN: 10 mg/dL (ref 6–24)
CO2: 26 mmol/L (ref 20–29)
Calcium: 9.4 mg/dL (ref 8.7–10.2)
Chloride: 97 mmol/L (ref 96–106)
Creatinine, Ser: 0.71 mg/dL (ref 0.57–1.00)
Glucose: 148 mg/dL — ABNORMAL HIGH (ref 65–99)
Potassium: 4 mmol/L (ref 3.5–5.2)
Sodium: 138 mmol/L (ref 134–144)
eGFR: 102 mL/min/{1.73_m2} (ref >59)

## 2021-04-22 ENCOUNTER — Ambulatory Visit: Payer: Medicare HMO | Admitting: Psychiatry

## 2021-04-25 ENCOUNTER — Ambulatory Visit: Payer: Medicare HMO | Admitting: Psychiatry

## 2021-06-08 DIAGNOSIS — E1165 Type 2 diabetes mellitus with hyperglycemia: Secondary | ICD-10-CM | POA: Diagnosis not present

## 2021-06-08 DIAGNOSIS — E119 Type 2 diabetes mellitus without complications: Secondary | ICD-10-CM | POA: Diagnosis not present

## 2021-06-08 DIAGNOSIS — Z6841 Body Mass Index (BMI) 40.0 and over, adult: Secondary | ICD-10-CM | POA: Diagnosis not present

## 2021-06-08 DIAGNOSIS — Z1331 Encounter for screening for depression: Secondary | ICD-10-CM | POA: Diagnosis not present

## 2021-07-12 ENCOUNTER — Other Ambulatory Visit: Payer: Self-pay | Admitting: Cardiovascular Disease

## 2021-07-28 ENCOUNTER — Other Ambulatory Visit: Payer: Self-pay

## 2021-07-28 ENCOUNTER — Ambulatory Visit: Payer: Medicare HMO | Admitting: Psychiatry

## 2021-07-28 ENCOUNTER — Encounter: Payer: Self-pay | Admitting: Psychiatry

## 2021-07-28 DIAGNOSIS — R632 Polyphagia: Secondary | ICD-10-CM | POA: Diagnosis not present

## 2021-07-28 DIAGNOSIS — F411 Generalized anxiety disorder: Secondary | ICD-10-CM | POA: Diagnosis not present

## 2021-07-28 DIAGNOSIS — F29 Unspecified psychosis not due to a substance or known physiological condition: Secondary | ICD-10-CM | POA: Diagnosis not present

## 2021-07-28 DIAGNOSIS — F4310 Post-traumatic stress disorder, unspecified: Secondary | ICD-10-CM | POA: Diagnosis not present

## 2021-07-28 DIAGNOSIS — F39 Unspecified mood [affective] disorder: Secondary | ICD-10-CM

## 2021-07-28 DIAGNOSIS — F4010 Social phobia, unspecified: Secondary | ICD-10-CM | POA: Diagnosis not present

## 2021-07-28 DIAGNOSIS — R69 Illness, unspecified: Secondary | ICD-10-CM | POA: Diagnosis not present

## 2021-07-28 MED ORDER — LUMATEPERONE TOSYLATE 42 MG PO CAPS: 42.0000 mg | ORAL_CAPSULE | Freq: Every day | ORAL | 1 refills | Status: DC

## 2021-07-28 NOTE — Progress Notes (Signed)
Amy [name] [id number] 1966-08-29 55 y.o.  Subjective:   Patient ID:  Amy [name] is a 55 y.o. (DOB 1966-09-09) female.  Chief Complaint:  Chief Complaint  Patient presents with   Anxiety   Other    Mood disturbance   Paranoid    HPI Amy [name] presents to the office today for follow-up of anxiety, mood disturbance, and insomnia. Husband had surgery and has been in PT. Reports husband remains unable to work. She reports that this has been stressful for her and that this has affected husband's mood. Reports husband's mood has improved some with medication.   Reports PCP started her on Lexapro 10 mg po qd about a month ago. She notices she does not need Xanax prn as often. Unsure if it has helped with her mood. She does not recall response to Depakote.   She reports that her mood has been depressed. She reports that she is wanting to stay in her room. Energy and motivation remain low. She reports frequently irritability. She reports certain things will trigger her anger and will "have to get to the bottom of it."  Denies elevated moods.  Reports recently applied for several credit cards. She reports continued poor sleep. She reports that she has racing thoughts and at times is unable to fall asleep until 2-4 am. She reports re-experiencing traumatic events and intrusive memories. Having nightmares about past traumatic experiences. Reports anxiety in crowded stores. Notices some obsessive thoughts and compulsions. Appetite has been good. She reports that she wants to eat late at night. She reports that she will crave sweets and then have hyperglycemia after eating sweets.   She reports some paranoia and feels that people may be talking about her at church. Had conflict with husband's relatives that live nearby and went to court and relatives were told not to come back on their property.   Mother found uncle dead.   Has been on social security disability since approximately 2020.    Past Medication Trials: Abilify- Irritable, restless, nausea Vraylar- akathisia Latuda- Effective, then stopped working Risperidone- "felt weird."  Seroquel Saphris Lamotrigine  Carbamazepine- SJS Topamax-Sleeplessness, irritability Prozac- Somewhat helpful for mood and anxiety.  Trileptal  Topamax Sertraline- Limited improvement in mood and anxiety s/s.  Prozac Lexapro Propranolol- effective for akathisia. May have been helpful for anxiety. Buspar- "did not like the way it made me feel"  Xanax Gabapentin- Adverse effects, drowsiness  PHQ2-9    Flowsheet Row Office Visit from 11/20/2018 in [facility]  PHQ-2 Total Score 0        Review of Systems:  Review of Systems  Constitutional:  Positive for fatigue.  Musculoskeletal:  Positive for arthralgias and myalgias. Negative for gait problem.  Psychiatric/Behavioral:         Please refer to HPI   Medications: I have reviewed the patient's current medications.  Current Outpatient Medications  Medication Sig Dispense Refill   ALPRAZolam (XANAX) 0.5 MG tablet Take 0.5 mg by mouth 4 (four) times daily as needed for anxiety.      carvedilol (COREG) 6.25 MG tablet Take 1 tablet (6.25 mg total) by mouth 2 (two) times daily. 180 tablet 3   escitalopram (LEXAPRO) 10 MG tablet Take 10 mg by mouth daily.     hydrochlorothiazide (MICROZIDE) 12.5 MG capsule TAKE 1 CAPSULE BY MOUTH EVERY DAY 90 capsule 1   loratadine (CLARITIN) 10 MG tablet Take 10 mg by mouth daily.     lumateperone tosylate (CAPLYTA) 42  MG capsule Take 1 capsule (42 mg total) by mouth daily. 30 capsule 1   metFORMIN (GLUCOPHAGE-XR) 500 MG 24 hr tablet Take 500 mg by mouth 2 (two) times daily with a meal. *May take additional tablet in the evening for high blood glucose levels     omeprazole (PRILOSEC) 20 MG capsule Take 20 mg by mouth daily.     Semaglutide,0.25 or 0.5MG/DOS, 2 MG/1.5ML SOPN Inject into the skin.     No current facility-administered  medications for this visit.    Medication Side Effects: None  Allergies:  Allergies  Allergen Reactions   Carbamazepine    Hydromorphone Nausea And Vomiting   Metformin Hives    Able to tolerate Metformin XR   Ciprofloxacin Rash    Past Medical History:  Diagnosis Date   Anemia    Anxiety    xanax prn   Blood transfusion without reported diagnosis    Degenerative disc disease    Diabetes mellitus without complication (HCC)    Fatty liver    Fibromyalgia    GERD (gastroesophageal reflux disease)    prilosec otc-instructed to take dos   Hot flashes 02/19/2014   Hypertension    controlled on hctz-bp at pat 145/94   Neuromuscular disorder (HCC)    firbrmylagia   Obesity    Panic attacks    PONV (postoperative nausea and vomiting)    pt states has not had ponv but has been treated in past with scopolamine, does admit to history of motion sickness, history of vertigo   Vertigo    recent diagnosis of vertigo-uses otc meclizine   Weight gain 02/19/2014    Past Medical History, Surgical history, Social history, and Family history were reviewed and updated as appropriate.   Please see review of systems for further details on the patient's review from today.   Objective:   Physical Exam:  LMP 05/01/2011   Physical Exam Constitutional:      General: She is not in acute distress. Musculoskeletal:        General: No deformity.  Neurological:     Mental Status: She is alert and oriented to person, place, and time.     Coordination: Coordination normal.  Psychiatric:        Attention and Perception: Attention and perception normal. She does not perceive auditory or visual hallucinations.        Mood and Affect: Mood is anxious and depressed. Affect is labile. Affect is not blunt, angry or inappropriate.        Speech: Speech normal.        Behavior: Behavior is cooperative.        Thought Content: Thought content is paranoid. Thought content is not delusional. Thought  content does not include homicidal or suicidal ideation. Thought content does not include homicidal or suicidal plan.        Cognition and Memory: Cognition and memory normal.        Judgment: Judgment normal.     Comments: Insight intact    Lab Review:     Component Value Date/Time   NA 138 04/18/2021 1235   K 4.0 04/18/2021 1235   CL 97 04/18/2021 1235   CO2 26 04/18/2021 1235   GLUCOSE 148 (H) 04/18/2021 1235   GLUCOSE 258 (H) 08/29/2020 0615   BUN 10 04/18/2021 1235   CREATININE 0.71 04/18/2021 1235   CREATININE 0.69 02/19/2014 1542   CALCIUM 9.4 04/18/2021 1235   PROT 7.2 08/29/2020 0615   ALBUMIN 3.7  08/29/2020 0615   AST 45 (H) 08/29/2020 0615   ALT 44 08/29/2020 0615   ALKPHOS 115 08/29/2020 0615   BILITOT 0.8 08/29/2020 0615   GFRNONAA 63 01/20/2021 1132   GFRAA 72 01/20/2021 1132       Component Value Date/Time   WBC 8.0 08/29/2020 0615   RBC 5.07 08/29/2020 0615   HGB 14.4 08/29/2020 0615   HCT 44.6 08/29/2020 0615   PLT 237 08/29/2020 0615   MCV 88.0 08/29/2020 0615   MCH 28.4 08/29/2020 0615   MCHC 32.3 08/29/2020 0615   RDW 14.0 08/29/2020 0615   LYMPHSABS 2.6 08/29/2020 0615   MONOABS 0.6 08/29/2020 0615   EOSABS 0.3 08/29/2020 0615   BASOSABS 0.1 08/29/2020 0615    No results found for: POCLITH, LITHIUM   No results found for: PHENYTOIN, PHENOBARB, VALPROATE, CBMZ   .res Assessment: Plan:     Patient seen for 30 minutes and time spent discussing adding medication that would help with mood stabilization and paranoia.  Discussed potential benefits, risks, and side effects of Caplyta.  She agrees to trial of (CAPLYTA).  Will start (CAPLYTA) 2 mg daily for mood stabilization and paranoia.   She reports that PCP will continue to manage alprazolam and Lexapro. Discussed potential benefits of therapy and patient agrees that resuming therapy would likely be beneficial.  Patient is assisted with scheduling appointment to resume therapy. Patient to  follow-up with this provider in 4 to 6 weeks or sooner if clinically indicated. Patient advised to contact office with any questions, adverse effects, or acute worsening in signs and symptoms.    Eran was seen today for anxiety, other and paranoid.  Diagnoses and all orders for this visit:  Episodic mood disorder ([place]) -     lumateperone tosylate (CAPLYTA) 42 MG capsule; Take 1 capsule (42 mg total) by mouth daily.  Psychosis, unspecified psychosis type ([place]) -     lumateperone tosylate (CAPLYTA) 42 MG capsule; Take 1 capsule (42 mg total) by mouth daily.  PTSD (post-traumatic stress disorder)  Binge eating  Generalized anxiety disorder  Social anxiety disorder    Please see After Visit Summary for patient specific instructions.  Future Appointments  Date Time Provider [place]  08/24/2021  2:00 PM Blanchie Serve, PhD [facility] None  08/25/2021  9:30 AM Thayer Headings, PMHNP [facility] None  10/20/2021  9:00 AM Croitoru, Dani Gobble, MD [facility]    No orders of the defined types were placed in this encounter.   -------------------------------

## 2021-08-03 ENCOUNTER — Telehealth: Payer: Self-pay

## 2021-08-03 NOTE — Telephone Encounter (Signed)
Prior Authorization submitted and approved for CAPLYTA 42 MG effective 01/04/2021-12/03/2021 with [employer] Part D DT#[id number]

## 2021-08-04 DIAGNOSIS — M47816 Spondylosis without myelopathy or radiculopathy, lumbar region: Secondary | ICD-10-CM | POA: Diagnosis not present

## 2021-08-04 DIAGNOSIS — K219 Gastro-esophageal reflux disease without esophagitis: Secondary | ICD-10-CM | POA: Diagnosis not present

## 2021-08-04 DIAGNOSIS — Z6841 Body Mass Index (BMI) 40.0 and over, adult: Secondary | ICD-10-CM | POA: Diagnosis not present

## 2021-08-04 DIAGNOSIS — E119 Type 2 diabetes mellitus without complications: Secondary | ICD-10-CM | POA: Diagnosis not present

## 2021-08-04 DIAGNOSIS — E669 Obesity, unspecified: Secondary | ICD-10-CM | POA: Diagnosis not present

## 2021-08-04 DIAGNOSIS — I10 Essential (primary) hypertension: Secondary | ICD-10-CM | POA: Diagnosis not present

## 2021-08-11 ENCOUNTER — Ambulatory Visit: Payer: Medicare HMO | Admitting: Psychiatry

## 2021-08-22 ENCOUNTER — Telehealth: Payer: Self-pay | Admitting: Psychiatry

## 2021-08-22 NOTE — Telephone Encounter (Signed)
Pt can't afford caplyta because even with her insurance it is $500.The PA was approved in august so I don't think that is the problem.She would like an alternative

## 2021-08-22 NOTE — Telephone Encounter (Signed)
Pt called reporting the samples you gave her, worked ok. Can't afford this med, it's $500 a month.I see a PA for Caplyta.  Willing to try something else that's not that costly. Pt contact [phone]

## 2021-08-22 NOTE — Telephone Encounter (Signed)
Contacted pharmacy and when they ran it, the cost is $528.00 for 30 day supply. Cost is $1700.00. Pt has Medicare so she is not able to use co-pay card. Let me get with Leda Gauze in the morning and see if Pt. Assistance or anything else is an option.

## 2021-08-23 NOTE — Telephone Encounter (Signed)
Amy [name] is contacting pt about a pt assistance form when she comes to pick up samples.

## 2021-08-24 ENCOUNTER — Ambulatory Visit: Payer: Medicare HMO | Admitting: Psychiatry

## 2021-08-25 ENCOUNTER — Ambulatory Visit: Payer: Medicare HMO | Admitting: Psychiatry

## 2021-08-25 ENCOUNTER — Telehealth: Payer: Self-pay | Admitting: Cardiovascular Disease

## 2021-08-25 NOTE — Telephone Encounter (Signed)
Pt c/o medication issue:  1. Name of Medication: hydrochlorothiazide (MICROZIDE) 12.5 MG capsule  2. How are you currently taking this medication (dosage and times per day)? Once daily  3. Are you having a reaction (difficulty breathing--STAT)? Lower back musculoskeletal pain   4. What is your medication issue? Patient wanted to know if she could stop this medication and see if her pain subsides. She read that her symptoms can be a side effect of this medication   Please advise

## 2021-08-25 NOTE — Telephone Encounter (Signed)
Returned call to pt she states that this lower back pain has been happening for "a couple of weeks now". She states that she thinks she is remembering that this happened before but she does not remember what she was told to do. She would like to take a vacation off this medication and see if the pain goes away. I told pt that this is not a side effect that we have heard of and will send message to Dr C and call her back with his message. Verbalizes understanding.   Will wait for Dr C.

## 2021-08-29 NOTE — Telephone Encounter (Signed)
The patient will call back with an update in 1-2 weeks.

## 2021-10-20 ENCOUNTER — Ambulatory Visit: Payer: Medicare HMO | Admitting: Cardiovascular Disease

## 2021-10-26 ENCOUNTER — Ambulatory Visit: Payer: Medicare HMO | Admitting: Cardiovascular Disease

## 2021-10-26 ENCOUNTER — Encounter: Payer: Self-pay | Admitting: Cardiovascular Disease

## 2021-10-26 ENCOUNTER — Other Ambulatory Visit: Payer: Self-pay

## 2021-10-26 VITALS — BP 128/80 | HR 97 | Ht 64.0 in | Wt 256.0 lb

## 2021-10-26 DIAGNOSIS — I10 Essential (primary) hypertension: Secondary | ICD-10-CM

## 2021-10-26 DIAGNOSIS — K7581 Nonalcoholic steatohepatitis (NASH): Secondary | ICD-10-CM | POA: Diagnosis not present

## 2021-10-26 DIAGNOSIS — E669 Obesity, unspecified: Secondary | ICD-10-CM | POA: Diagnosis not present

## 2021-10-26 DIAGNOSIS — E782 Mixed hyperlipidemia: Secondary | ICD-10-CM

## 2021-10-26 DIAGNOSIS — Z87448 Personal history of other diseases of urinary system: Secondary | ICD-10-CM

## 2021-10-26 DIAGNOSIS — E1169 Type 2 diabetes mellitus with other specified complication: Secondary | ICD-10-CM | POA: Diagnosis not present

## 2021-10-26 NOTE — Patient Instructions (Signed)

## 2021-10-26 NOTE — Progress Notes (Signed)
Cardiology Office Note:    Date:  10/28/2021   ID:  Amy [name], DOB 07-19-1966, MRN [id number]  PCP:  Redmond School, MD  Cardiologist:  Sanda Klein, MD  Electrophysiologist:  None   Referring MD: Redmond School, MD   Chief Complaint  Patient presents with   Hypertension     History of Present Illness:    Amy [name] is a 55 y.o. female with a hx of severe obesity, HTN, mixed hyperlipidemia, type 2 diabetes mellitus, fatty liver, gastroesophageal reflux disease.  For the most part her blood pressure is settled down in normal range.  When she first checked in today her diastolic blood pressure was quite high, but after sitting and relaxing for several minutes it returned to normal range.  She is morbidly obese and is very upset that she keeps gaining weight.  She does not exercise regularly.  She carefully watches her diet however.  She is currently taking Ozempic, but this really has not helped yet with weight loss and seems to cause worsening of her emotional issues.  She continues have emotional issues.  She became teary-eyed several times today in the office.  She still has flashbacks and nightmares about her ectopic pregnancy and is very despondent about her inability to have children.  She thinks she truly has posttraumatic stress disorder.  She is also under emotional strain caring for elderly family members with health problems and dealing with financial issues, as her husband is currently out of work.  Attempts to stop her diuretic therapy were associated with worsening edema.  Higher doses of diuretic on 1 occasion led to acute kidney injury with an elevation in creatinine to 2.5.  She did not tolerate amlodipine due to ankle swelling and flushing.  She does take carvedilol twice daily.  She probably had an episode of beta-blocker rebound when she first started Ozempic since she was throwing up her medicines.    Glycemic control has improved with the most recent  hemoglobin A1c of 7.1%.  Her most recent LDL cholesterol was 120.  Her BMI is 44.  CT angiography August 2020 for chest pain did not show pulmonary embolism and was also significant for the complete absence of atherosclerotic calcification in her coronary arteries or thoracic aorta.  CXR was unrevealing and she has a normal gallbladder on right upper quadrant ultrasound.  ALT and AST are elevated < 2xULN since 2014 when she had an abdominal ultrasound that showed fatty liver. No evidence of renal artery stenosis on Duplex US Feb 2022.  Past Medical History:  Diagnosis Date   Anemia    Anxiety    xanax prn   Blood transfusion without reported diagnosis    Degenerative disc disease    Diabetes mellitus without complication ([place])    Fatty liver    Fibromyalgia    GERD (gastroesophageal reflux disease)    prilosec otc-instructed to take dos   Hot flashes 02/19/2014   Hypertension    controlled on hctz-bp at pat 145/94   Neuromuscular disorder (HCC)    firbrmylagia   Obesity    Panic attacks    PONV (postoperative nausea and vomiting)    pt states has not had ponv but has been treated in past with scopolamine, does admit to history of motion sickness, history of vertigo   Vertigo    recent diagnosis of vertigo-uses otc meclizine   Weight gain 02/19/2014    Past Surgical History:  Procedure Laterality Date   ABDOMINAL HYSTERECTOMY  BREAST REDUCTION SURGERY     2010   ECTOPIC PREGNANCY SURGERY     2002   LAPAROSCOPIC ASSISTED VAGINAL HYSTERECTOMY  06/14/2011   Procedure: LAPAROSCOPIC ASSISTED VAGINAL HYSTERECTOMY;  Surgeon: Luz Lex, MD;  Location: [facility];  Service: Gynecology;  Laterality: N/A;  Abdomen and vagina   LAPAROSCOPY     2006-for adhesions    Current Medications: Current Meds  Medication Sig   ALPRAZolam (XANAX) 0.5 MG tablet Take 0.5 mg by mouth 4 (four) times daily as needed for anxiety.    carvedilol (COREG) 6.25 MG tablet Take 1 tablet (6.25 mg total) by  mouth 2 (two) times daily.   escitalopram (LEXAPRO) 10 MG tablet Take 10 mg by mouth daily.   hydrochlorothiazide (MICROZIDE) 12.5 MG capsule TAKE 1 CAPSULE BY MOUTH EVERY DAY   loratadine (CLARITIN) 10 MG tablet Take 10 mg by mouth daily.   lumateperone tosylate (CAPLYTA) 42 MG capsule Take 1 capsule (42 mg total) by mouth daily.   metFORMIN (GLUCOPHAGE-XR) 500 MG 24 hr tablet Take 500 mg by mouth 2 (two) times daily with a meal. *May take additional tablet in the evening for high blood glucose levels   omeprazole (PRILOSEC) 20 MG capsule Take 20 mg by mouth daily.   Semaglutide,0.25 or 0.5MG/DOS, 2 MG/1.5ML SOPN Inject into the skin.     Allergies:   Carbamazepine, Hydromorphone, Metformin, and Ciprofloxacin   Social History   Socioeconomic History   Marital status: Married    Spouse name: Not on file   Number of children: Not on file   Years of education: Not on file   Highest education level: Not on file  Occupational History   Not on file  Tobacco Use   Smoking status: Never   Smokeless tobacco: Never  Substance and Sexual Activity   Alcohol use: No   Drug use: No   Sexual activity: Yes    Partners: Male    Birth control/protection: Surgical    Comment: married  Other Topics Concern   Not on file  Social History Narrative   Not on file   Social Determinants of Health   Financial Resource Strain: Not on file  Food Insecurity: Not on file  Transportation Needs: Not on file  Physical Activity: Not on file  Stress: Not on file  Social Connections: Not on file     Family History: The patient's family history includes Anxiety disorder in her mother, paternal grandmother, and paternal uncle; Arthritis in her mother, sister, and sister; Cancer in her maternal grandmother and paternal uncle; Depression in her mother; Diabetes in her father, maternal grandfather, paternal grandfather, and paternal grandmother; Hypertension in her father, maternal grandfather, paternal  grandfather, and paternal grandmother; Mood Disorder in her brother and maternal uncle; Panic disorder in her sister; Psychosis in her maternal uncle.  ROS:   Please see the history of present illness.     All other systems reviewed and are negative.  EKGs/Labs/Other Studies Reviewed:    The following studies were reviewed today: Renal duplex ultrasound 01/21/2021    Right: Normal size right kidney. Normal right Resistive Index.         Normal cortical thickness of right kidney. No evidence of         right renal artery stenosis. RRV flow present.  Left:  Normal size of left kidney. Normal left Resistive Index.         Normal cortical thickness of the left kidney. 1-59% stenosis  of the left renal artery. LRV flow present. There is a kink         in the vessel at the origin/proximal segment that is likely         causing mildly elevated velocities.  Mesenteric:  Normal Celiac artery and Superior Mesenteric artery findings.  Patent IVC.   EKG:  EKG is ordered today.  It is very similar to previous tracings and shows normal sinus rhythm, normal except for slight delay in R wave progression across the anterior precordium.  QTc 450 ms.  Recent Labs: 01/17/2021: Magnesium 1.9 04/18/2021: BUN 10; Creatinine, Ser 0.71; Potassium 4.0; Sodium 138   06/08/2021 hemoglobin A1c 7.1%  Recent Lipid Panel No results found for: CHOL, TRIG, HDL, CHOLHDL, VLDL, LDLCALC, LDLDIRECT July 30, 2019 total cholesterol 216, HDL 43, LDL 125, triglycerides 241 Hemoglobin A1c 6.6% Hemoglobin 15.6, creatinine 0.7, potassium 3.8, ALT 69  11/29/2020 Cholesterol 202, HDL 40, LDL 120, triglycerides 241 Physical Exam:    VS:  BP 128/80   Pulse 97   Ht 5' 4"  (1.626 m)   Wt 256 lb (116.1 kg)   LMP 05/01/2011   SpO2 98%   BMI 43.94 kg/m     Wt Readings from Last 3 Encounters:  10/26/21 256 lb (116.1 kg)  03/25/21 266 lb 9.6 oz (120.9 kg)  01/25/21 271 lb 6.4 oz (123.1 kg)      General:  Alert, oriented x3, no distress, obese Head: no evidence of trauma, PERRL, EOMI, no exophtalmos or lid lag, no myxedema, no xanthelasma; normal ears, nose and oropharynx Neck: normal jugular venous pulsations and no hepatojugular reflux; brisk carotid pulses without delay and no carotid bruits Chest: clear to auscultation, no signs of consolidation by percussion or palpation, normal fremitus, symmetrical and full respiratory excursions Cardiovascular: normal position and quality of the apical impulse, regular rhythm, normal first and second heart sounds, no murmurs, rubs or gallops Abdomen: no tenderness or distention, no masses by palpation, no abnormal pulsatility or arterial bruits, normal bowel sounds, no hepatosplenomegaly Extremities: no clubbing, cyanosis or edema; 2+ radial, ulnar and brachial pulses bilaterally; 2+ right femoral, posterior tibial and dorsalis pedis pulses; 2+ left femoral, posterior tibial and dorsalis pedis pulses; no subclavian or femoral bruits Neurological: grossly nonfocal Psych: Depressed.  Crying frequently.   ASSESSMENT:    1. Essential hypertension   2. History of acute renal failure   3. Mixed hyperlipidemia   4. NASH (nonalcoholic steatohepatitis)   5. Diabetes mellitus type 2 in obese ([place])   6. Morbid obesity ([place])     PLAN:    In order of problems listed above:   HTN: Adequate control. History of AKI: Resolved.  We will try to avoid the combination of RAAS inhibitors and diuretics due to her tendency to develop prerenal azotemia/renal insufficiency.  HLP: Typical lipid profile suggestive of the metabolic syndrome/insulin resistance with low HDL, elevated triglycerides and likely adverse small dense LDL particle profile.  She has been concerned about the use of statins due to her history of abnormal liver function tests, but I suspect that was due to obesity.  We will hold off starting a statin while she adjusts to her current treatment with  Ozempic. NASH: No evidence of parenchymal liver insufficiency or structural changes other than fatty liver.  She does not drink alcohol.  Weight loss is the major way to deal with this. Should even consider bariatric surgery, if conservative efforts at weight loss are not successful. DM: Improving control, almost  at target.  Discussed the positive effect of GLP-1 agonists on cardiovascular disease outcome. Morbid obesity: Should even consider bariatric surgery, if conservative efforts at weight loss are not successful.  However, she needs assistance in achieving emotional stability first, before undergoing evaluation for this.    Medication Adjustments/Labs and Tests Ordered: Current medicines are reviewed at length with the patient today.  Concerns regarding medicines are outlined above.  No orders of the defined types were placed in this encounter.  No orders of the defined types were placed in this encounter.   Patient Instructions  Medication Instructions:  No changes *If you need a refill on your cardiac medications before your next appointment, please call your pharmacy*   Lab Work: None ordered If you have labs (blood work) drawn today and your tests are completely normal, you will receive your results only by: [place] (if you have MyChart) OR A paper copy in the mail If you have any lab test that is abnormal or we need to change your treatment, we will call you to review the results.   Testing/Procedures: None ordered   Follow-Up: At [facility], you and your health needs are our priority.  As part of our continuing mission to provide you with exceptional heart care, we have created designated Provider Care Teams.  These Care Teams include your primary Cardiologist (physician) and Advanced Practice Providers (APPs -  Physician Assistants and Nurse Practitioners) who all work together to provide you with the care you need, when you need it.  We recommend signing  up for the patient portal called "MyChart".  Sign up information is provided on this After Visit Summary.  MyChart is used to connect with patients for Virtual Visits (Telemedicine).  Patients are able to view lab/test results, encounter notes, upcoming appointments, etc.  Non-urgent messages can be sent to your provider as well.   To learn more about what you can do with MyChart, go to [email or web address].    Your next appointment:   12 month(s)  The format for your next appointment:   In Person  Provider:   Sanda Klein, MD      Signed, Sanda Klein, MD  10/28/2021 12:07 PM    [place]

## 2021-10-28 ENCOUNTER — Encounter: Payer: Self-pay | Admitting: Cardiovascular Disease

## 2021-10-28 DIAGNOSIS — I10 Essential (primary) hypertension: Secondary | ICD-10-CM | POA: Insufficient documentation

## 2021-10-28 DIAGNOSIS — E1169 Type 2 diabetes mellitus with other specified complication: Secondary | ICD-10-CM | POA: Insufficient documentation

## 2021-10-28 DIAGNOSIS — K7581 Nonalcoholic steatohepatitis (NASH): Secondary | ICD-10-CM | POA: Insufficient documentation

## 2021-10-28 DIAGNOSIS — E782 Mixed hyperlipidemia: Secondary | ICD-10-CM | POA: Insufficient documentation

## 2021-10-28 DIAGNOSIS — E669 Obesity, unspecified: Secondary | ICD-10-CM | POA: Insufficient documentation

## 2021-11-11 DIAGNOSIS — H16223 Keratoconjunctivitis sicca, not specified as Sjogren's, bilateral: Secondary | ICD-10-CM | POA: Diagnosis not present

## 2021-11-11 DIAGNOSIS — H1132 Conjunctival hemorrhage, left eye: Secondary | ICD-10-CM | POA: Diagnosis not present

## 2021-11-16 DIAGNOSIS — Z1231 Encounter for screening mammogram for malignant neoplasm of breast: Secondary | ICD-10-CM | POA: Diagnosis not present

## 2021-11-17 DIAGNOSIS — I10 Essential (primary) hypertension: Secondary | ICD-10-CM | POA: Diagnosis not present

## 2021-11-17 DIAGNOSIS — E559 Vitamin D deficiency, unspecified: Secondary | ICD-10-CM | POA: Diagnosis not present

## 2021-11-17 DIAGNOSIS — Z0001 Encounter for general adult medical examination with abnormal findings: Secondary | ICD-10-CM | POA: Diagnosis not present

## 2021-11-17 DIAGNOSIS — E119 Type 2 diabetes mellitus without complications: Secondary | ICD-10-CM | POA: Diagnosis not present

## 2021-11-17 DIAGNOSIS — Z6841 Body Mass Index (BMI) 40.0 and over, adult: Secondary | ICD-10-CM | POA: Diagnosis not present

## 2021-11-17 DIAGNOSIS — E538 Deficiency of other specified B group vitamins: Secondary | ICD-10-CM | POA: Diagnosis not present

## 2021-12-14 ENCOUNTER — Encounter: Payer: Self-pay | Admitting: Cardiovascular Disease

## 2021-12-16 ENCOUNTER — Other Ambulatory Visit: Payer: Self-pay | Admitting: Cardiovascular Disease

## 2021-12-29 DIAGNOSIS — H35033 Hypertensive retinopathy, bilateral: Secondary | ICD-10-CM | POA: Diagnosis not present

## 2021-12-29 DIAGNOSIS — H524 Presbyopia: Secondary | ICD-10-CM | POA: Diagnosis not present

## 2022-01-02 DIAGNOSIS — D1801 Hemangioma of skin and subcutaneous tissue: Secondary | ICD-10-CM | POA: Diagnosis not present

## 2022-01-02 DIAGNOSIS — L820 Inflamed seborrheic keratosis: Secondary | ICD-10-CM | POA: Diagnosis not present

## 2022-01-02 DIAGNOSIS — L918 Other hypertrophic disorders of the skin: Secondary | ICD-10-CM | POA: Diagnosis not present

## 2022-01-12 ENCOUNTER — Other Ambulatory Visit: Payer: Self-pay | Admitting: Cardiovascular Disease

## 2022-01-17 DIAGNOSIS — Z6841 Body Mass Index (BMI) 40.0 and over, adult: Secondary | ICD-10-CM | POA: Diagnosis not present

## 2022-01-17 DIAGNOSIS — E669 Obesity, unspecified: Secondary | ICD-10-CM | POA: Diagnosis not present

## 2022-01-17 DIAGNOSIS — E119 Type 2 diabetes mellitus without complications: Secondary | ICD-10-CM | POA: Diagnosis not present

## 2022-01-17 DIAGNOSIS — Z7985 Long-term (current) use of injectable non-insulin antidiabetic drugs: Secondary | ICD-10-CM | POA: Diagnosis not present

## 2022-01-17 DIAGNOSIS — I10 Essential (primary) hypertension: Secondary | ICD-10-CM | POA: Diagnosis not present

## 2022-01-17 DIAGNOSIS — Z7984 Long term (current) use of oral hypoglycemic drugs: Secondary | ICD-10-CM | POA: Diagnosis not present

## 2022-02-01 ENCOUNTER — Telehealth: Payer: Self-pay | Admitting: Psychiatry

## 2022-02-01 NOTE — Telephone Encounter (Signed)
Patient called regarding paperwork she dropped off. States that she needs JC to write a letter of what she has so that she can be excused from jury duty. Pls rtc [phone] ?

## 2022-02-01 NOTE — Telephone Encounter (Signed)
Paperwork is in your box.She stated she just needs the letter to state her diagnosis/reasons why she needs to be excused.She wants it faxed to the number circled in the packet and wants a copy to pick up as well.Stated she has anxiety about the jury duty.She wants to know if you can give her a call today if you have time and needs the paper work by Friday.

## 2022-02-01 NOTE — Telephone Encounter (Signed)
Paperwork placed in office inbox. Please draft jury duty letter due to PTSD and Mood Disorder.  ?

## 2022-02-01 NOTE — Telephone Encounter (Signed)
LVM to rtc 

## 2022-02-03 NOTE — Telephone Encounter (Signed)
Letter has been faxed. Pt notified that she can pick up as well. ?

## 2022-02-13 ENCOUNTER — Encounter: Payer: Self-pay | Admitting: Cardiovascular Disease

## 2022-02-13 DIAGNOSIS — I10 Essential (primary) hypertension: Secondary | ICD-10-CM

## 2022-02-13 DIAGNOSIS — Z79899 Other long term (current) drug therapy: Secondary | ICD-10-CM

## 2022-02-14 DIAGNOSIS — Z79899 Other long term (current) drug therapy: Secondary | ICD-10-CM | POA: Diagnosis not present

## 2022-02-14 DIAGNOSIS — I10 Essential (primary) hypertension: Secondary | ICD-10-CM | POA: Diagnosis not present

## 2022-02-14 MED ORDER — HYDROCHLOROTHIAZIDE 12.5 MG PO CAPS: 25.0000 mg | ORAL_CAPSULE | Freq: Every day | ORAL | 6 refills | Status: DC

## 2022-02-15 LAB — BASIC METABOLIC PANEL
BUN/Creatinine Ratio: 6 — ABNORMAL LOW (ref 9–23)
BUN: 5 mg/dL — ABNORMAL LOW (ref 6–24)
CO2: 29 mmol/L (ref 20–29)
Calcium: 9.6 mg/dL (ref 8.7–10.2)
Chloride: 98 mmol/L (ref 96–106)
Creatinine, Ser: 0.79 mg/dL (ref 0.57–1.00)
Glucose: 95 mg/dL (ref 70–99)
Potassium: 4 mmol/L (ref 3.5–5.2)
Sodium: 141 mmol/L (ref 134–144)
eGFR: 89 mL/min/{1.73_m2} (ref >59)

## 2022-02-17 DIAGNOSIS — E119 Type 2 diabetes mellitus without complications: Secondary | ICD-10-CM | POA: Diagnosis not present

## 2022-02-17 DIAGNOSIS — E669 Obesity, unspecified: Secondary | ICD-10-CM | POA: Diagnosis not present

## 2022-02-17 DIAGNOSIS — Z6841 Body Mass Index (BMI) 40.0 and over, adult: Secondary | ICD-10-CM | POA: Diagnosis not present

## 2022-02-17 DIAGNOSIS — Z7984 Long term (current) use of oral hypoglycemic drugs: Secondary | ICD-10-CM | POA: Diagnosis not present

## 2022-02-17 DIAGNOSIS — Z7985 Long-term (current) use of injectable non-insulin antidiabetic drugs: Secondary | ICD-10-CM | POA: Diagnosis not present

## 2022-02-21 ENCOUNTER — Encounter: Payer: Self-pay | Admitting: Psychiatry

## 2022-02-21 ENCOUNTER — Other Ambulatory Visit: Payer: Self-pay | Admitting: Psychiatry

## 2022-02-21 ENCOUNTER — Other Ambulatory Visit: Payer: Self-pay

## 2022-02-21 ENCOUNTER — Ambulatory Visit: Payer: Medicare HMO | Admitting: Psychiatry

## 2022-02-21 VITALS — Wt 248.0 lb

## 2022-02-21 DIAGNOSIS — F39 Unspecified mood [affective] disorder: Secondary | ICD-10-CM | POA: Diagnosis not present

## 2022-02-21 DIAGNOSIS — F411 Generalized anxiety disorder: Secondary | ICD-10-CM | POA: Diagnosis not present

## 2022-02-21 DIAGNOSIS — F4310 Post-traumatic stress disorder, unspecified: Secondary | ICD-10-CM | POA: Diagnosis not present

## 2022-02-21 DIAGNOSIS — R69 Illness, unspecified: Secondary | ICD-10-CM | POA: Diagnosis not present

## 2022-02-21 DIAGNOSIS — F4010 Social phobia, unspecified: Secondary | ICD-10-CM | POA: Diagnosis not present

## 2022-02-21 MED ORDER — LURASIDONE HCL 40 MG PO TABS: ORAL_TABLET | ORAL | 2 refills | Status: DC

## 2022-02-21 NOTE — Progress Notes (Signed)
[name] ?[id number] ?1966/09/09 ?56 y.o. ? ?Subjective:  ? ?Patient ID:  Amy [name] is a 56 y.o. (DOB Jan 05, 1966) female. ? ?Chief Complaint:  ?Chief Complaint  ?Patient presents with  ? Anxiety  ? Depression  ? ? ?HPI ?Amy [name] presents to the office today for follow-up of anxiety, mood disturbance, and insomnia. She reports that Lexapro 20 mg seems to be more effective for anxiety and mood compared to prozac. She reports that she has some panic attacks. Notices worsening anxiety around anniversaries of past traumatic events. Frequent worry. She reports that she occasionally is quick to anger. Occ episodes of uncontrolled crying. Some impulsive and excessive spending. She reports that she has depression at times. She reports poor sleep. Sleep qty can vary from 3-5 hours. Energy and motivation have been low. Appetite has been less with Ozempic. She reports difficulty with concentration. Denies SI.  ? ?Husband has been out of work about 2 years after injury and continues to have health issues.  She reports that he has had some depression.  ? ?Past Medication Trials: ?Abilify- Irritable, restless, nausea ?Vraylar- akathisia ?Latuda- Effective, then stopped working ?Risperidone- "felt weird."  ?Seroquel ?Saphris ?Caplyta ?Lamotrigine  ?Carbamazepine- SJS ?Topamax-Sleeplessness, irritability ?Prozac- Somewhat helpful for mood and anxiety.  ?Trileptal  ?Topamax ?Sertraline- Limited improvement in mood and anxiety s/s.  ?Lexapro ?Propranolol- effective for akathisia. May have been helpful for anxiety. ?Buspar- "did not like the way it made me feel"  ?Xanax ?Gabapentin- Adverse effects, drowsiness ? ?PHQ2-9   ? ?[place] Office Visit from 11/20/2018 in [facility]  ?PHQ-2 Total Score 0  ? ?  ?  ? ?Review of Systems:  ?Review of Systems  ?Musculoskeletal:  Positive for arthralgias and myalgias. Negative for gait problem.  ?Neurological:  Negative for tremors.  ?Psychiatric/Behavioral:    ?      Please refer to HPI  ? ?Medications: I have reviewed the patient's current medications. ? ?Current Outpatient Medications  ?Medication Sig Dispense Refill  ? ALPRAZolam (XANAX) 0.5 MG tablet Take 0.5 mg by mouth 4 (four) times daily as needed for anxiety.     ? carvedilol (COREG) 6.25 MG tablet TAKE 1 TABLET BY MOUTH TWICE A DAY 180 tablet 2  ? escitalopram (LEXAPRO) 20 MG tablet Take 20 mg by mouth daily.    ? hydrochlorothiazide (MICROZIDE) 12.5 MG capsule Take 2 capsules (25 mg total) by mouth daily. 30 capsule 6  ? lansoprazole (PREVACID) 15 MG capsule Take by mouth.    ? loratadine (CLARITIN) 10 MG tablet Take 10 mg by mouth daily.    ? lurasidone (LATUDA) 40 MG TABS tablet Take 1/2 tablet daily with supper for one week, then increase to 1 tablet daily with supper 30 tablet 2  ? metFORMIN (GLUCOPHAGE-XR) 500 MG 24 hr tablet Take 1,000 mg by mouth 2 (two) times daily with a meal. *May take additional tablet in the evening for high blood glucose levels    ? Semaglutide,0.25 or 0.5MG/DOS, 2 MG/1.5ML SOPN Inject into the skin.    ? ?No current facility-administered medications for this visit.  ? ? ?Medication Side Effects: None ? ?Allergies:  ?Allergies  ?Allergen Reactions  ? Carbamazepine   ? Hydromorphone Nausea And Vomiting  ? Metformin Hives  ?  Able to tolerate Metformin XR  ? Ciprofloxacin Rash  ? ? ?Past Medical History:  ?Diagnosis Date  ? Anemia   ? Anxiety   ? xanax prn  ? Blood transfusion without reported diagnosis   ?  Degenerative disc disease   ? Diabetes mellitus without complication ([place])   ? Fatty liver   ? Fibromyalgia   ? GERD (gastroesophageal reflux disease)   ? prilosec otc-instructed to take dos  ? Hot flashes 02/19/2014  ? Hypertension   ? controlled on hctz-bp at pat 145/94  ? Neuromuscular disorder ([place])   ? firbrmylagia  ? Obesity   ? Panic attacks   ? PONV (postoperative nausea and vomiting)   ? pt states has not had ponv but has been treated in past with scopolamine, does admit to  history of motion sickness, history of vertigo  ? Vertigo   ? recent diagnosis of vertigo-uses otc meclizine  ? Weight gain 02/19/2014  ? ? ?Past Medical History, Surgical history, Social history, and Family history were reviewed and updated as appropriate.  ? ?Please see review of systems for further details on the patient's review from today.  ? ?Objective:  ? ?Physical Exam:  ?Wt 248 lb (112.5 kg)   LMP 05/01/2011   BMI 42.57 kg/m?  ? ?Physical Exam ?Constitutional:   ?   General: She is not in acute distress. ?Musculoskeletal:     ?   General: No deformity.  ?Neurological:  ?   Mental Status: She is alert and oriented to person, place, and time.  ?   Coordination: Coordination normal.  ?Psychiatric:     ?   Attention and Perception: Attention and perception normal. She does not perceive auditory or visual hallucinations.     ?   Mood and Affect: Mood is anxious. Affect is not labile, blunt, angry or inappropriate.     ?   Speech: Speech normal.     ?   Behavior: Behavior normal.     ?   Thought Content: Thought content normal. Thought content is not paranoid or delusional. Thought content does not include homicidal or suicidal ideation. Thought content does not include homicidal or suicidal plan.     ?   Cognition and Memory: Cognition and memory normal.     ?   Judgment: Judgment normal.  ?   Comments: Insight intact ?Dysphoric mood  ? ? ?Lab Review:  ?   ?Component Value Date/Time  ? NA 141 02/14/2022 1255  ? K 4.0 02/14/2022 1255  ? CL 98 02/14/2022 1255  ? CO2 29 02/14/2022 1255  ? GLUCOSE 95 02/14/2022 1255  ? GLUCOSE 258 (H) 08/29/2020 0615  ? BUN 5 (L) 02/14/2022 1255  ? CREATININE 0.79 02/14/2022 1255  ? CREATININE 0.69 02/19/2014 1542  ? CALCIUM 9.6 02/14/2022 1255  ? PROT 7.2 08/29/2020 0615  ? ALBUMIN 3.7 08/29/2020 0615  ? AST 45 (H) 08/29/2020 0615  ? ALT 44 08/29/2020 0615  ? ALKPHOS 115 08/29/2020 0615  ? BILITOT 0.8 08/29/2020 0615  ? GFRNONAA 63 01/20/2021 1132  ? GFRAA 72 01/20/2021 1132   ? ? ?   ?Component Value Date/Time  ? WBC 8.0 08/29/2020 0615  ? RBC 5.07 08/29/2020 0615  ? HGB 14.4 08/29/2020 0615  ? HCT 44.6 08/29/2020 0615  ? PLT 237 08/29/2020 0615  ? MCV 88.0 08/29/2020 0615  ? MCH 28.4 08/29/2020 0615  ? MCHC 32.3 08/29/2020 0615  ? RDW 14.0 08/29/2020 0615  ? LYMPHSABS 2.6 08/29/2020 0615  ? MONOABS 0.6 08/29/2020 0615  ? EOSABS 0.3 08/29/2020 0615  ? BASOSABS 0.1 08/29/2020 0615  ? ? ?No results found for: POCLITH, LITHIUM  ? ?No results found for: PHENYTOIN, PHENOBARB, VALPROATE, CBMZ  ? ?.res ?  Assessment: Plan:   ?Pt seen for 30 minutes and time spent discussing medication options to improve mood lability. Time also spent discussing her desire to re-start therapy and agreed that therapy would likely be beneficial. Referral made to Luan Moore, PhD.  ?Reviewed potential benefits, risks, and side effects of Latuda since she has taken it in the past and initially experienced some improvement in mood and anxiety symptoms. Discussed that generic of Latuda recently became available. Pt agrees to re-start of Latuda.  ?Will start Latuda 40 mg 1/2 tablet daily with supper for one week, then increase to 1 tablet daily with supper for mood s/s.  ?Pt to follow-up in 3 months or sooner if clinically indicated.  ?Patient advised to contact office with any questions, adverse effects, or acute worsening in signs and symptoms. ? ? ? ?[place] was seen today for anxiety and depression. ? ?Diagnoses and all orders for this visit: ? ?Episodic mood disorder (HCC) ?-     lurasidone (LATUDA) 40 MG TABS tablet; Take 1/2 tablet daily with supper for one week, then increase to 1 tablet daily with supper ? ?PTSD (post-traumatic stress disorder) ? ?Generalized anxiety disorder ? ?Social anxiety disorder ? ?  ? ?Please see After Visit Summary for patient specific instructions. ? ?Future Appointments  ?Date Time Provider [place]  ?04/05/2022 11:00 AM Blanchie Serve, PhD CP-CP None  ?05/24/2022 10:00 AM  Thayer Headings, PMHNP CP-CP None  ? ? ?No orders of the defined types were placed in this encounter. ? ? ?------------------------------- ?

## 2022-02-22 NOTE — Telephone Encounter (Signed)
See pharmacy note.  There is an approval for 2018 for Latuda, but nothing currently.  ?

## 2022-02-26 NOTE — Telephone Encounter (Signed)
Prior Authorization submitted for LURASIDONE 40 MG with [employer] approved effective 12/04/2021-12/03/2022. ?

## 2022-02-27 DIAGNOSIS — R748 Abnormal levels of other serum enzymes: Secondary | ICD-10-CM | POA: Diagnosis not present

## 2022-04-02 ENCOUNTER — Other Ambulatory Visit: Payer: Self-pay | Admitting: Cardiovascular Disease

## 2022-04-05 ENCOUNTER — Ambulatory Visit: Payer: Medicare HMO | Admitting: Psychiatry

## 2022-04-16 ENCOUNTER — Encounter: Payer: Self-pay | Admitting: Cardiovascular Disease

## 2022-04-18 NOTE — Telephone Encounter (Signed)
Spoke to patient she requested appointment for her mother Amy [name] to see Dr.Croitoru.Stated her mother has been having sob.She had a abd ct yesterday which showed aortic atherosclerosis.Appointment scheduled with Dr.Croitoru 5/18 at 1:20 pm.  ?

## 2022-04-24 DIAGNOSIS — Z7985 Long-term (current) use of injectable non-insulin antidiabetic drugs: Secondary | ICD-10-CM | POA: Diagnosis not present

## 2022-04-24 DIAGNOSIS — Z6841 Body Mass Index (BMI) 40.0 and over, adult: Secondary | ICD-10-CM | POA: Diagnosis not present

## 2022-04-24 DIAGNOSIS — E1169 Type 2 diabetes mellitus with other specified complication: Secondary | ICD-10-CM | POA: Diagnosis not present

## 2022-04-24 DIAGNOSIS — E669 Obesity, unspecified: Secondary | ICD-10-CM | POA: Diagnosis not present

## 2022-04-24 DIAGNOSIS — E1165 Type 2 diabetes mellitus with hyperglycemia: Secondary | ICD-10-CM | POA: Diagnosis not present

## 2022-04-24 DIAGNOSIS — Z7984 Long term (current) use of oral hypoglycemic drugs: Secondary | ICD-10-CM | POA: Diagnosis not present

## 2022-04-25 ENCOUNTER — Encounter: Payer: Self-pay | Admitting: Cardiovascular Disease

## 2022-05-02 ENCOUNTER — Encounter: Payer: Self-pay | Admitting: Cardiovascular Disease

## 2022-05-03 ENCOUNTER — Encounter: Payer: Self-pay | Admitting: Cardiovascular Disease

## 2022-05-24 ENCOUNTER — Ambulatory Visit: Payer: Medicare HMO | Admitting: Psychiatry

## 2022-05-31 ENCOUNTER — Ambulatory Visit: Payer: Medicare HMO | Admitting: Psychiatry

## 2022-07-28 ENCOUNTER — Encounter: Payer: Self-pay | Admitting: Cardiovascular Disease

## 2022-07-28 NOTE — Telephone Encounter (Signed)
Thanks, no reason to change heart meds or have sooner follow up. Sedatives can reduce BP.

## 2022-07-29 ENCOUNTER — Encounter: Payer: Self-pay | Admitting: Emergency Medicine

## 2022-07-29 ENCOUNTER — Other Ambulatory Visit: Payer: Self-pay

## 2022-07-29 ENCOUNTER — Emergency Department: Payer: Medicare HMO

## 2022-07-29 ENCOUNTER — Emergency Department
Admission: EM | Admit: 2022-07-29 | Discharge: 2022-07-29 | Disposition: A | Discharge status: Home / Self Care | Payer: Medicare HMO | Attending: Emergency Medicine | Admitting: Emergency Medicine

## 2022-07-29 DIAGNOSIS — I10 Essential (primary) hypertension: Secondary | ICD-10-CM | POA: Diagnosis not present

## 2022-07-29 DIAGNOSIS — R002 Palpitations: Secondary | ICD-10-CM | POA: Diagnosis not present

## 2022-07-29 DIAGNOSIS — E119 Type 2 diabetes mellitus without complications: Secondary | ICD-10-CM | POA: Insufficient documentation

## 2022-07-29 DIAGNOSIS — E1165 Type 2 diabetes mellitus with hyperglycemia: Secondary | ICD-10-CM | POA: Diagnosis not present

## 2022-07-29 DIAGNOSIS — R42 Dizziness and giddiness: Secondary | ICD-10-CM | POA: Diagnosis not present

## 2022-07-29 DIAGNOSIS — R197 Diarrhea, unspecified: Secondary | ICD-10-CM | POA: Insufficient documentation

## 2022-07-29 DIAGNOSIS — Z20822 Contact with and (suspected) exposure to covid-19: Secondary | ICD-10-CM | POA: Insufficient documentation

## 2022-07-29 DIAGNOSIS — R1011 Right upper quadrant pain: Secondary | ICD-10-CM | POA: Diagnosis not present

## 2022-07-29 DIAGNOSIS — R5383 Other fatigue: Secondary | ICD-10-CM

## 2022-07-29 DIAGNOSIS — R079 Chest pain, unspecified: Secondary | ICD-10-CM | POA: Diagnosis not present

## 2022-07-29 LAB — URINALYSIS, ROUTINE W REFLEX MICROSCOPIC
Bilirubin Urine: NEGATIVE
Glucose, UA: NEGATIVE mg/dL
Hgb urine dipstick: NEGATIVE
Ketones, ur: NEGATIVE mg/dL
Nitrite: NEGATIVE
Protein, ur: NEGATIVE mg/dL
Specific Gravity, Urine: 1.02 (ref 1.005–1.030)
pH: 6 (ref 5.0–8.0)

## 2022-07-29 LAB — CBC
HCT: 45.6 % (ref 36.0–46.0)
Hemoglobin: 15.4 g/dL — ABNORMAL HIGH (ref 12.0–15.0)
MCH: 28.5 pg (ref 26.0–34.0)
MCHC: 33.8 g/dL (ref 30.0–36.0)
MCV: 84.3 fL (ref 80.0–100.0)
Platelets: 273 10*3/uL (ref 150–400)
RBC: 5.41 MIL/uL — ABNORMAL HIGH (ref 3.87–5.11)
RDW: 14.4 % (ref 11.5–15.5)
WBC: 9.7 10*3/uL (ref 4.0–10.5)
nRBC: 0 % (ref 0.0–0.2)

## 2022-07-29 LAB — LIPASE, BLOOD: Lipase: 37 U/L (ref 11–51)

## 2022-07-29 LAB — HEPATIC FUNCTION PANEL
ALT: 53 U/L — ABNORMAL HIGH (ref 0–44)
AST: 56 U/L — ABNORMAL HIGH (ref 15–41)
Albumin: 4 g/dL (ref 3.5–5.0)
Alkaline Phosphatase: 89 U/L (ref 38–126)
Bilirubin, Direct: 0.3 mg/dL — ABNORMAL HIGH (ref 0.0–0.2)
Indirect Bilirubin: 0.7 mg/dL (ref 0.3–0.9)
Total Bilirubin: 1 mg/dL (ref 0.3–1.2)
Total Protein: 7.9 g/dL (ref 6.5–8.1)

## 2022-07-29 LAB — TROPONIN I (HIGH SENSITIVITY)
Troponin I (High Sensitivity): 4 ng/L (ref <18)
Troponin I (High Sensitivity): 3 ng/L (ref <18)

## 2022-07-29 LAB — BASIC METABOLIC PANEL
Anion gap: 11 (ref 5–15)
BUN: 11 mg/dL (ref 6–20)
CO2: 26 mmol/L (ref 22–32)
Calcium: 9.2 mg/dL (ref 8.9–10.3)
Chloride: 101 mmol/L (ref 98–111)
Creatinine, Ser: 0.77 mg/dL (ref 0.44–1.00)
GFR, Estimated: >60 mL/min (ref >60)
Glucose, Bld: 203 mg/dL — ABNORMAL HIGH (ref 70–99)
Potassium: 3.4 mmol/L — ABNORMAL LOW (ref 3.5–5.1)
Sodium: 138 mmol/L (ref 135–145)

## 2022-07-29 LAB — SARS CORONAVIRUS 2 BY RT PCR: SARS Coronavirus 2 by RT PCR: NEGATIVE

## 2022-07-29 LAB — CBG MONITORING, ED: Glucose-Capillary: 193 mg/dL — ABNORMAL HIGH (ref 70–99)

## 2022-07-29 LAB — TSH: TSH: 3.373 u[IU]/mL (ref 0.350–4.500)

## 2022-07-29 MED ORDER — PANTOPRAZOLE SODIUM 40 MG IV SOLR
40.0000 mg | Freq: Once | INTRAVENOUS | Status: AC
  Administered 2022-07-29: 40 mg via INTRAVENOUS
  Filled 2022-07-29: qty 10

## 2022-07-29 MED ORDER — ONDANSETRON HCL 4 MG/2ML IJ SOLN
4.0000 mg | Freq: Once | INTRAMUSCULAR | Status: AC
  Administered 2022-07-29: 4 mg via INTRAVENOUS
  Filled 2022-07-29: qty 2

## 2022-07-29 MED ORDER — LACTATED RINGERS IV BOLUS
1000.0000 mL | Freq: Once | INTRAVENOUS | Status: AC
  Administered 2022-07-29: 1000 mL via INTRAVENOUS

## 2022-07-29 MED ORDER — KETOROLAC TROMETHAMINE 15 MG/ML IJ SOLN
15.0000 mg | Freq: Once | INTRAMUSCULAR | Status: AC
  Administered 2022-07-29: 15 mg via INTRAVENOUS
  Filled 2022-07-29: qty 1

## 2022-07-29 NOTE — ED Triage Notes (Signed)
Pt via POV from home. Pt states since Wednesday she has been sweating more than normal, having heart palpitations, weakness, and lethargy. States she has a hx of DM, HTN, and anxiety. States that on Wednesday that her CBG dropped to 60. Pt is alert and oriented.

## 2022-07-29 NOTE — ED Provider Triage Note (Signed)
Emergency Medicine Provider Triage Evaluation Note  Amy [name], a 56 y.o. female  was evaluated in triage.  Pt complains of increased episodes of spontaneous sweating as well as having heart palpitations, weakness, and lethargy.  She has a history of type 2 diabetes, hypertension, and anxiety.  She notes 1 episode on Wednesday when her CBG dropped to 60 mg/DL.  She currently takes metformin and is being followed by endocrinology.  Review of Systems  Positive: Sweats, malaise, and heart palpitations Negative: FC, NVD  Physical Exam  BP 135/69 (BP Location: Right Arm)   Pulse 93   Temp 98.2 F (36.8 C) (Oral)   Resp 18   Ht 5' 4"  (1.626 m)   Wt 106.6 kg   LMP 05/01/2011   SpO2 95%   BMI 40.34 kg/m  Gen:   Awake, no distress  NAD Resp:  Normal effort CTA MSK:   Moves extremities without difficulty  CVS:  RRR  Medical Decision Making  Medically screening exam initiated at 5:05 PM.  Appropriate orders placed.  Amy [name] was informed that the remainder of the evaluation will be completed by another provider, this initial triage assessment does not replace that evaluation, and the importance of remaining in the ED until their evaluation is complete.  To the ED for evaluation of several days of intermittent sweats as well as malaise, fatigue, and weakness.  Patient reports a single low blood sugar reading of 60 last week on Wednesday.   Melvenia Needles, PA-C 07/29/22 1709

## 2022-07-29 NOTE — ED Provider Notes (Signed)
[facility] Provider Note    Event Date/Time   First MD Initiated Contact with Patient 07/29/22 1835     (approximate)   History   Chief Complaint: Weakness   HPI  Amy [name] is a 56 y.o. female with a history of hypertension, diabetes, [place], night sweats, hot flashes who comes the ED complaining of increased sweating, hot flashes, heart palpitations, malaise and fatigue for the past 3 days.  Waxing and waning, no aggravating or alleviating factors.  Denies vomiting but did have diarrhea 2 days ago.  Reports eating and drinking normally.  No fever.  No chest pain or shortness of breath or cough.     Physical Exam   Triage Vital Signs: ED Triage Vitals  Enc Vitals Group     BP 07/29/22 1651 135/69     Pulse Rate 07/29/22 1651 93     Resp 07/29/22 1651 18     Temp 07/29/22 1651 98.2 F (36.8 C)     Temp Source 07/29/22 1651 Oral     SpO2 07/29/22 1651 95 %     Weight 07/29/22 1654 235 lb (106.6 kg)     Height 07/29/22 1654 5' 4"  (1.626 m)     Head Circumference --      Peak Flow --      Pain Score 07/29/22 1654 0     Pain Loc --      Pain Edu? --      Excl. in [place]? --     Most recent vital signs: Vitals:   07/29/22 2015 07/29/22 2045  BP: 104/66 102/74  Pulse: 73 72  Resp: 18 16  Temp:    SpO2: 95% 93%    General: Awake, no distress.  CV:  Good peripheral perfusion.  Regular rate and rhythm Resp:  Normal effort.  Clear to auscultation bilaterally Abd:  No distention.  Soft with right upper quadrant tenderness.  No peritoneal signs Other:  Moist oral mucosa.  No lower extremity edema.  No rash   ED Results / Procedures / Treatments   Labs (all labs ordered are listed, but only abnormal results are displayed) Labs Reviewed  BASIC METABOLIC PANEL - Abnormal; Notable for the following components:      Result Value   Potassium 3.4 (*)    Glucose, Bld 203 (*)    All other components within normal limits  CBC - Abnormal; Notable  for the following components:   RBC 5.41 (*)    Hemoglobin 15.4 (*)    All other components within normal limits  HEPATIC FUNCTION PANEL - Abnormal; Notable for the following components:   AST 56 (*)    ALT 53 (*)    Bilirubin, Direct 0.3 (*)    All other components within normal limits  URINALYSIS, ROUTINE W REFLEX MICROSCOPIC - Abnormal; Notable for the following components:   Color, Urine YELLOW (*)    APPearance HAZY (*)    Leukocytes,Ua TRACE (*)    Bacteria, UA RARE (*)    All other components within normal limits  CBG MONITORING, ED - Abnormal; Notable for the following components:   Glucose-Capillary 193 (*)    All other components within normal limits  SARS CORONAVIRUS 2 BY RT PCR  TSH  LIPASE, BLOOD  T4  POC URINE PREG, ED  TROPONIN I (HIGH SENSITIVITY)  TROPONIN I (HIGH SENSITIVITY)     EKG Interpreted by me Normal sinus rhythm rate of 88.  Normal axis, normal intervals.  Poor R wave progression.  Normal ST segments and T waves.   RADIOLOGY Chest x-ray interpreted by me, appears normal.  Negative for pneumonia.  Radiology report reviewed  Ultrasound right upper quadrant unremarkable.  PROCEDURES:  Procedures   MEDICATIONS ORDERED IN ED: Medications  lactated ringers bolus 1,000 mL (0 mLs Intravenous Stopped 07/29/22 2134)  ketorolac (TORADOL) 15 MG/ML injection 15 mg (15 mg Intravenous Given 07/29/22 1855)  ondansetron (ZOFRAN) injection 4 mg (4 mg Intravenous Given 07/29/22 1856)  pantoprazole (PROTONIX) injection 40 mg (40 mg Intravenous Given 07/29/22 1857)     IMPRESSION / MDM / ASSESSMENT AND PLAN / ED COURSE  I reviewed the triage vital signs and the nursing notes.                              Differential diagnosis includes, but is not limited to, viral illness, dehydration, thyroid dysfunction, electrolyte abnormality, anemia, AKI, pancreatitis, biliary disease.  Doubt pericarditis, ACS, PE, mesenteric ischemia, dissection, aneurysm,  sepsis  Patient's presentation is most consistent with acute presentation with potential threat to life or bodily function.  Patient presents with multiple constitutional symptoms, possibly perimenopausal.  Vital signs are unremarkable.  Initial lab panel is normal.  Symptoms are noncardiac.  We will add on LFTs, lipase, COVID test, obtain ultrasound right upper quadrant.   ----------------------------------------- 10:42 PM on 07/29/2022 ----------------------------------------- LFTs, right upper quadrant ultrasound all unremarkable.  Patient feeling better after medications.  Symptoms possibly perimenopausal due to normal work-up, normal vitals.  Stable for discharge, follow-up with her endocrinologist regarding diabetes regimen and her primary care doctor this week.      FINAL CLINICAL IMPRESSION(S) / ED DIAGNOSES   Final diagnoses:  Other fatigue  Dizziness  Type 2 diabetes mellitus without complication, without long-term current use of insulin ([place])     Rx / DC Orders   ED Discharge Orders     None        Note:  This document was prepared using Dragon voice recognition software and may include unintentional dictation errors.   Carrie Mew, MD 07/29/22 2242

## 2022-07-29 NOTE — ED Notes (Addendum)
Pt to ED with husband, complains of several symptoms that occur intermittently. States she experiences episodes of profuse sweating, generalized weakness, blurry vision that lasts a few minutes and resolves (not concurrently), RLQ abdominal discomfort with hx fatty liver disease, and palpitations. Pt does have gallbladder still. Hx partial hysterectomy. EDP at bedside.

## 2022-07-31 ENCOUNTER — Encounter: Payer: Self-pay | Admitting: Cardiovascular Disease

## 2022-08-01 DIAGNOSIS — Z6841 Body Mass Index (BMI) 40.0 and over, adult: Secondary | ICD-10-CM | POA: Diagnosis not present

## 2022-08-01 DIAGNOSIS — Z7984 Long term (current) use of oral hypoglycemic drugs: Secondary | ICD-10-CM | POA: Diagnosis not present

## 2022-08-01 DIAGNOSIS — E669 Obesity, unspecified: Secondary | ICD-10-CM | POA: Diagnosis not present

## 2022-08-01 DIAGNOSIS — E1165 Type 2 diabetes mellitus with hyperglycemia: Secondary | ICD-10-CM | POA: Diagnosis not present

## 2022-08-01 LAB — T4: T4, Total: 8.1 ug/dL (ref 4.5–12.0)

## 2022-08-02 ENCOUNTER — Telehealth: Payer: Self-pay | Admitting: *Deleted

## 2022-08-02 NOTE — Patient Outreach (Signed)
  Care Coordination   08/02/2022 Name: Amy [name] MRN: [id number] DOB: [birth year]   Care Coordination Outreach Attempts:  An unsuccessful telephone outreach was attempted today to offer the patient information about available care coordination services as a benefit of their health plan.   Follow Up Plan:  Additional outreach attempts will be made to offer the patient care coordination information and services.   Encounter Outcome:  No Answer  Care Coordination Interventions Activated:  No   Care Coordination Interventions:  No, not indicated    Chong Sicilian, BSN, RN-BC [facility] Pharmacist, community Dial: [phone]

## 2022-08-04 DIAGNOSIS — Z6839 Body mass index (BMI) 39.0-39.9, adult: Secondary | ICD-10-CM | POA: Diagnosis not present

## 2022-08-04 DIAGNOSIS — R69 Illness, unspecified: Secondary | ICD-10-CM | POA: Diagnosis not present

## 2022-08-04 DIAGNOSIS — E119 Type 2 diabetes mellitus without complications: Secondary | ICD-10-CM | POA: Diagnosis not present

## 2022-08-04 DIAGNOSIS — I10 Essential (primary) hypertension: Secondary | ICD-10-CM | POA: Diagnosis not present

## 2022-08-04 DIAGNOSIS — F419 Anxiety disorder, unspecified: Secondary | ICD-10-CM | POA: Diagnosis not present

## 2022-08-09 ENCOUNTER — Telehealth: Payer: Self-pay | Admitting: *Deleted

## 2022-08-09 NOTE — Patient Outreach (Signed)
  Care Coordination   08/09/2022 Name: Amy [name] MRN: [id number] DOB: [birth year]   Care Coordination Outreach Attempts:  A second unsuccessful outreach was attempted today to offer the patient with information about available care coordination services as a benefit of their health plan.     Follow Up Plan:  Additional outreach attempts will be made to offer the patient care coordination information and services.   Encounter Outcome:  No Answer  Care Coordination Interventions Activated:  No   Care Coordination Interventions:  No, not indicated    Valente David, RN, MSN, [facility] Care Management Care Management Coordinator [phone]

## 2022-08-14 ENCOUNTER — Telehealth: Payer: Self-pay | Admitting: *Deleted

## 2022-08-14 NOTE — Patient Outreach (Signed)
  Care Coordination   08/14/2022 Name: Amy [name] MRN: [id number] DOB: September 23, 1966   Care Coordination Outreach Attempts:  A third unsuccessful outreach was attempted today to offer the patient with information about available care coordination services as a benefit of their health plan.   Follow Up Plan:  No further outreach attempts will be made at this time. We have been unable to contact the patient to offer or enroll patient in care coordination services  Encounter Outcome:  No Answer  Care Coordination Interventions Activated:  No   Care Coordination Interventions:  No, not indicated    Valente David, RN, MSN, [facility] Care Management Care Management Coordinator [phone]

## 2022-08-18 ENCOUNTER — Other Ambulatory Visit: Payer: Self-pay | Admitting: *Deleted

## 2022-08-18 DIAGNOSIS — E782 Mixed hyperlipidemia: Secondary | ICD-10-CM

## 2022-08-22 ENCOUNTER — Telehealth: Payer: Self-pay | Admitting: Cardiovascular Disease

## 2022-08-22 NOTE — Telephone Encounter (Signed)
   Pt would like to ask Dr. Loletha Grayer if its ok to hold off the calcium scoring test for right now. She said, there's something going on with their family (mother-in-law) and trying to priorities her right now. She wanted to ask Dr. Loletha Grayer until when she can hold it off and how important for her to get this test

## 2022-08-22 NOTE — Telephone Encounter (Signed)
That is fine. No rush.

## 2022-08-22 NOTE — Telephone Encounter (Signed)
Spoke to patient - she would like to  perform the  Calcium scoring sometime in Dec  2023 or Jan 2024. If it is okay with Dr Sallyanne Kuster -  patient dose have a recall for Nov 2023 but she would like to postpone  follow up  annual visit until after the decision with having calcium score testis decide. Aware will defer to Dr Sallyanne Kuster

## 2022-08-22 NOTE — Telephone Encounter (Signed)
Patient has been made aware that this is fine.

## 2022-09-11 ENCOUNTER — Other Ambulatory Visit: Payer: Self-pay | Admitting: Cardiovascular Disease

## 2022-09-11 ENCOUNTER — Ambulatory Visit: Payer: Medicare HMO | Admitting: Podiatry

## 2022-09-11 DIAGNOSIS — L600 Ingrowing nail: Secondary | ICD-10-CM | POA: Diagnosis not present

## 2022-09-11 MED ORDER — CEPHALEXIN 500 MG PO CAPS: 500.0000 mg | ORAL_CAPSULE | Freq: Three times a day (TID) | ORAL | 0 refills | Status: AC

## 2022-09-11 NOTE — Patient Instructions (Signed)

## 2022-09-14 NOTE — Progress Notes (Addendum)
  Subjective:  Patient ID: Amy [name], female    DOB: 10/04/1966,  MRN: [id number]  Chief Complaint  Patient presents with   Nail Problem    (np) right foot great toe pain/redness/unable to apply pressureq/ draining - started hurting about a week or so ago, she picked at it a few days ago, part of the nail came off. She is diabetic and nervous about needles    56 y.o. female presents with the above complaint. History confirmed with patient.   Objective:  Physical Exam: warm, good capillary refill, no trophic changes or ulcerative lesions, normal DP and PT pulses, normal sensory exam, and ingrown right hallux nail lateral border paronychia.  Assessment:   1. Ingrowing right great toenail      Plan:  Patient was evaluated and treated and all questions answered.    Ingrown Nail, right -Patient elects to proceed with minor surgery to remove ingrown toenail today. Consent reviewed and signed by patient. -Ingrown nail excised. See procedure note. -Educated on post-procedure care including soaking. Written instructions provided and reviewed. -Patient to follow up as needed, discussed that permanent partial matricectomy is an option in the future when it is not infected.  Rx for Keflex sent to pharmacy.  Advised on signs symptoms of worsening infections and she should call for reevaluation if this occurs  Procedure: Excision of Ingrown Toenail Location: Right 1st toe lateral nail borders. Anesthesia: Lidocaine 1% plain; 1.5 mL and Marcaine 0.5% plain; 1.5 mL, digital block. Skin Prep: Betadine. Dressing: Silvadene; telfa; dry, sterile, compression dressing. Technique: Following skin prep, the toe was exsanguinated and a tourniquet was secured at the base of the toe. The affected nail border was freed, split with a nail splitter, and excised.   The tourniquet was then removed and sterile dressing applied. Disposition: Patient tolerated procedure well.   Return if symptoms worsen or  fail to improve.

## 2022-09-20 ENCOUNTER — Ambulatory Visit (INDEPENDENT_AMBULATORY_CARE_PROVIDER_SITE_OTHER): Payer: Medicare HMO

## 2022-09-20 ENCOUNTER — Other Ambulatory Visit: Payer: Self-pay | Admitting: Podiatry

## 2022-09-20 ENCOUNTER — Ambulatory Visit: Payer: Self-pay | Admitting: Podiatry

## 2022-09-20 ENCOUNTER — Encounter: Payer: Self-pay | Admitting: Podiatry

## 2022-09-20 ENCOUNTER — Ambulatory Visit: Payer: Medicare HMO | Admitting: Podiatry

## 2022-09-20 DIAGNOSIS — M7751 Other enthesopathy of right foot: Secondary | ICD-10-CM

## 2022-09-20 MED ORDER — INDOMETHACIN 50 MG PO CAPS: 50.0000 mg | ORAL_CAPSULE | Freq: Two times a day (BID) | ORAL | 1 refills | Status: DC

## 2022-09-20 MED ORDER — CEPHALEXIN 500 MG PO CAPS: 500.0000 mg | ORAL_CAPSULE | Freq: Three times a day (TID) | ORAL | 0 refills | Status: DC

## 2022-09-20 NOTE — Progress Notes (Signed)
She presents today after having seen Dr. Sherryle Lis last week for a partial nail removal.  She states it is very red and tender and that this area was red and tender prior to him performing the procedure.  As she points t to the hallux interphalangeal joint medially.  She states exquisitely tender feels like it goes down to the bone.  She feels like she may have worn wrong shoes that may have caused this.  Objective: Vital signs stable alert oriented x3 the matrixectomy or the avulsion site looks perfect there is no erythema edema cellulitis drainage or odor however she does have tenderness at the level and just proximal to the interphalangeal joint.  Radiographs taken today do not demonstrate any type of osseous abnormalities other than some mild sclerosis at the base of the proximal phalanx at the condyle.  I do not see any fractures present here concerning for possible gout.  Assessment: Well-healing avulsion.  Probable arthropathy cannot rule out 141% certainty that there is no infection there.  Plan: Discussed etiology pathology conservative therapies are going to ask for CBC and a arthritic panel.  We will start her on Keflex and indomethacin.  She will follow-up with [place] in 3 weeks.

## 2022-09-21 DIAGNOSIS — M10079 Idiopathic gout, unspecified ankle and foot: Secondary | ICD-10-CM | POA: Diagnosis not present

## 2022-09-21 DIAGNOSIS — M7751 Other enthesopathy of right foot: Secondary | ICD-10-CM | POA: Diagnosis not present

## 2022-09-22 ENCOUNTER — Telehealth: Payer: Self-pay | Admitting: Podiatry

## 2022-09-22 ENCOUNTER — Other Ambulatory Visit: Payer: Self-pay | Admitting: Podiatry

## 2022-09-22 LAB — SEDIMENTATION RATE: Sed Rate: 18 mm/hr (ref 0–40)

## 2022-09-22 LAB — CBC WITH DIFFERENTIAL/PLATELET
Basophils Absolute: 0.1 10*3/uL (ref 0.0–0.2)
Basos: 1 %
EOS (ABSOLUTE): 0.2 10*3/uL (ref 0.0–0.4)
Eos: 3 %
Hematocrit: 43.3 % (ref 34.0–46.6)
Hemoglobin: 14.5 g/dL (ref 11.1–15.9)
Immature Grans (Abs): 0.1 10*3/uL (ref 0.0–0.1)
Immature Granulocytes: 1 %
Lymphocytes Absolute: 2.7 10*3/uL (ref 0.7–3.1)
Lymphs: 30 %
MCH: 29.5 pg (ref 26.6–33.0)
MCHC: 33.5 g/dL (ref 31.5–35.7)
MCV: 88 fL (ref 79–97)
Monocytes Absolute: 0.5 10*3/uL (ref 0.1–0.9)
Monocytes: 6 %
Neutrophils Absolute: 5.4 10*3/uL (ref 1.4–7.0)
Neutrophils: 59 %
Platelets: 235 10*3/uL (ref 150–450)
RBC: 4.92 x10E6/uL (ref 3.77–5.28)
RDW: 14 % (ref 11.7–15.4)
WBC: 9 10*3/uL (ref 3.4–10.8)

## 2022-09-22 LAB — RHEUMATOID FACTOR: Rheumatoid fact SerPl-aCnc: <10 IU/mL (ref <14.0)

## 2022-09-22 LAB — URIC ACID: Uric Acid: 10.4 mg/dL — ABNORMAL HIGH (ref 3.0–7.2)

## 2022-09-22 LAB — ANA: Anti Nuclear Antibody (ANA): NEGATIVE

## 2022-09-22 LAB — C-REACTIVE PROTEIN: CRP: 20 mg/L — ABNORMAL HIGH (ref 0–10)

## 2022-09-22 MED ORDER — METHYLPREDNISOLONE 4 MG PO TBPK: ORAL_TABLET | ORAL | 0 refills | Status: DC

## 2022-09-22 NOTE — Telephone Encounter (Signed)
Patient is calling and concerned about her labs, is seeing on Mychart, worried especially about the hematocrit levels, her doctor will be out of the office for 2 weeks and does not want to wait that long ,please call with results Amy [name].

## 2022-09-22 NOTE — Telephone Encounter (Signed)
Pt called wanting to to get her lab results today. Pt was transfer to a nurse. Pt has appt 10/11/22

## 2022-10-11 ENCOUNTER — Encounter: Payer: Self-pay | Admitting: Cardiovascular Disease

## 2022-10-11 ENCOUNTER — Ambulatory Visit: Payer: Medicare HMO | Admitting: Podiatry

## 2022-11-30 ENCOUNTER — Other Ambulatory Visit (HOSPITAL_BASED_OUTPATIENT_CLINIC_OR_DEPARTMENT_OTHER): Payer: Medicare HMO

## 2022-12-01 ENCOUNTER — Other Ambulatory Visit: Payer: Self-pay | Admitting: Cardiovascular Disease

## 2022-12-01 DIAGNOSIS — Z1331 Encounter for screening for depression: Secondary | ICD-10-CM | POA: Diagnosis not present

## 2022-12-01 DIAGNOSIS — Z0001 Encounter for general adult medical examination with abnormal findings: Secondary | ICD-10-CM | POA: Diagnosis not present

## 2022-12-01 DIAGNOSIS — Z6841 Body Mass Index (BMI) 40.0 and over, adult: Secondary | ICD-10-CM | POA: Diagnosis not present

## 2022-12-01 DIAGNOSIS — E119 Type 2 diabetes mellitus without complications: Secondary | ICD-10-CM | POA: Diagnosis not present

## 2022-12-01 DIAGNOSIS — I10 Essential (primary) hypertension: Secondary | ICD-10-CM | POA: Diagnosis not present

## 2022-12-07 DIAGNOSIS — H524 Presbyopia: Secondary | ICD-10-CM | POA: Diagnosis not present

## 2022-12-07 DIAGNOSIS — Z0100 Encounter for examination of eyes and vision without abnormal findings: Secondary | ICD-10-CM | POA: Diagnosis not present

## 2022-12-07 DIAGNOSIS — E119 Type 2 diabetes mellitus without complications: Secondary | ICD-10-CM | POA: Diagnosis not present

## 2022-12-12 ENCOUNTER — Ambulatory Visit: Payer: Medicare HMO | Admitting: Psychiatry

## 2022-12-20 ENCOUNTER — Ambulatory Visit: Payer: Medicare HMO | Admitting: Psychiatry

## 2022-12-26 ENCOUNTER — Ambulatory Visit: Payer: Medicare HMO | Admitting: Psychiatry

## 2023-01-08 ENCOUNTER — Other Ambulatory Visit (HOSPITAL_BASED_OUTPATIENT_CLINIC_OR_DEPARTMENT_OTHER): Payer: Medicare HMO

## 2023-01-24 DIAGNOSIS — L718 Other rosacea: Secondary | ICD-10-CM | POA: Diagnosis not present

## 2023-01-24 DIAGNOSIS — L218 Other seborrheic dermatitis: Secondary | ICD-10-CM | POA: Diagnosis not present

## 2023-02-02 ENCOUNTER — Ambulatory Visit (HOSPITAL_BASED_OUTPATIENT_CLINIC_OR_DEPARTMENT_OTHER)
Admission: RE | Admit: 2023-02-02 | Discharge: 2023-02-02 | Disposition: A | Discharge status: Home / Self Care | Payer: Medicare HMO | Source: Ambulatory Visit | Attending: Cardiovascular Disease | Admitting: Cardiovascular Disease

## 2023-02-02 DIAGNOSIS — E782 Mixed hyperlipidemia: Secondary | ICD-10-CM

## 2023-02-03 ENCOUNTER — Encounter: Payer: Self-pay | Admitting: Cardiovascular Disease

## 2023-02-05 DIAGNOSIS — E039 Hypothyroidism, unspecified: Secondary | ICD-10-CM | POA: Diagnosis not present

## 2023-02-05 DIAGNOSIS — Z1331 Encounter for screening for depression: Secondary | ICD-10-CM | POA: Diagnosis not present

## 2023-02-05 DIAGNOSIS — E559 Vitamin D deficiency, unspecified: Secondary | ICD-10-CM | POA: Diagnosis not present

## 2023-02-05 DIAGNOSIS — E119 Type 2 diabetes mellitus without complications: Secondary | ICD-10-CM | POA: Diagnosis not present

## 2023-02-05 DIAGNOSIS — Z0001 Encounter for general adult medical examination with abnormal findings: Secondary | ICD-10-CM | POA: Diagnosis not present

## 2023-02-05 DIAGNOSIS — D518 Other vitamin B12 deficiency anemias: Secondary | ICD-10-CM | POA: Diagnosis not present

## 2023-02-07 DIAGNOSIS — D518 Other vitamin B12 deficiency anemias: Secondary | ICD-10-CM | POA: Diagnosis not present

## 2023-02-08 ENCOUNTER — Encounter: Payer: Self-pay | Admitting: Cardiovascular Disease

## 2023-02-08 ENCOUNTER — Ambulatory Visit: Payer: Medicare HMO | Attending: Cardiovascular Disease | Admitting: Cardiovascular Disease

## 2023-02-08 VITALS — BP 130/86 | HR 80 | Ht 64.0 in | Wt 246.8 lb

## 2023-02-08 DIAGNOSIS — I10 Essential (primary) hypertension: Secondary | ICD-10-CM

## 2023-02-08 DIAGNOSIS — K7581 Nonalcoholic steatohepatitis (NASH): Secondary | ICD-10-CM

## 2023-02-08 DIAGNOSIS — E782 Mixed hyperlipidemia: Secondary | ICD-10-CM | POA: Diagnosis not present

## 2023-02-08 DIAGNOSIS — E1169 Type 2 diabetes mellitus with other specified complication: Secondary | ICD-10-CM | POA: Diagnosis not present

## 2023-02-08 DIAGNOSIS — E669 Obesity, unspecified: Secondary | ICD-10-CM | POA: Diagnosis not present

## 2023-02-08 MED ORDER — PRAVASTATIN SODIUM 40 MG PO TABS: 40.0000 mg | ORAL_TABLET | Freq: Every evening | ORAL | 3 refills | Status: DC

## 2023-02-08 NOTE — Patient Instructions (Addendum)
Medication Instructions:  Prevastatin '40mg'$  once a day  *If you need a refill on your cardiac medications before your next appointment, please call your pharmacy*  Lab Work: Lipid Panel and CMET- in 3 months- (Fasting) If you have labs (blood work) drawn today and your tests are completely normal, you will receive your results only by: [place] (if you have MyChart) OR A paper copy in the mail If you have any lab test that is abnormal or we need to change your treatment, we will call you to review the results.  Follow-Up: At [facility], you and your health needs are our priority.  As part of our continuing mission to provide you with exceptional heart care, we have created designated Provider Care Teams.  These Care Teams include your primary Cardiologist (physician) and Advanced Practice Providers (APPs -  Physician Assistants and Nurse Practitioners) who all work together to provide you with the care you need, when you need it.  We recommend signing up for the patient portal called "MyChart".  Sign up information is provided on this After Visit Summary.  MyChart is used to connect with patients for Virtual Visits (Telemedicine).  Patients are able to view lab/test results, encounter notes, upcoming appointments, etc.  Non-urgent messages can be sent to your provider as well.   To learn more about what you can do with MyChart, go to [email or web address].    Your next appointment:   1 year(s)  Provider:   Sanda Klein, MD

## 2023-02-08 NOTE — Progress Notes (Signed)
Cardiology Office Note:    Date:  02/08/2023   ID:  Amy [name], DOB 10-29-1966, MRN [id number]  PCP:  Redmond School, MD  Cardiologist:  Sanda Klein, MD  Electrophysiologist:  None   Referring MD: Redmond School, MD   No chief complaint on file.    History of Present Illness:    Amy [name] is a 57 y.o. female with a hx of severe obesity, HTN, mixed hyperlipidemia, type 2 diabetes mellitus, fatty liver, gastroesophageal reflux disease.  Her blood pressure is not controlled pretty consistently.  She does not have problems with orthopnea, PND, edema, palpitations, dizziness, syncope, focal neurological complaints or claudication.  She is very frustrated that she has not made progress with weight loss.  She only took Ozempic for 1 or 2 doses and then stopped it because she was concerned about possible side effects.  She has considered bariatric surgery, but has never even enrolled in a consistent program of weight loss with professional support.  Frustrated at what she perceives to be conflicting indications regarding her diet for her different comorbid conditions.  Reports concern about statin side effects if she were to be prescribed this.  Emotional issues seem to be better controlled.  On a positive note, her coronary calcium score is 0.  Metabolic control remains suboptimal with the most recent hemoglobin A1c of 7.5%.  HDL remains low at 37 and her LDL cholesterol has actually gone up to to 137.  She has mildly elevated AST and ALT, less than twice the upper limit of normal, probably due to fatty liver.  CT angiography August 2020 for chest pain did not show pulmonary embolism and was also significant for the complete absence of atherosclerotic calcification in her coronary arteries or thoracic aorta.  CXR was unrevealing and she has a normal gallbladder on right upper quadrant ultrasound.  ALT and AST are elevated < 2xULN since 2014 when she had an abdominal ultrasound that  showed fatty liver. No evidence of renal artery stenosis on Duplex US Feb 2022.  Coronary calcium score in 2024 showed a score of 0.  She still has flashbacks and nightmares about her ectopic pregnancy and is very despondent about her inability to have children.  She thinks she truly has posttraumatic stress disorder.  She is also under emotional strain caring for elderly family members with health problems and dealing with financial issues, as her husband is currently out of work.  Attempts to stop her diuretic therapy were associated with worsening edema.  Higher doses of diuretic on 1 occasion led to acute kidney injury with an elevation in creatinine to 2.5.  She did not tolerate amlodipine due to ankle swelling and flushing.  She does take carvedilol twice daily.  She probably had an episode of beta-blocker rebound when she first started Ozempic since she was throwing up her medicines.   Past Medical History:  Diagnosis Date   Anemia    Anxiety    xanax prn   Blood transfusion without reported diagnosis    Degenerative disc disease    Diabetes mellitus without complication ([place])    Fatty liver    Fibromyalgia    GERD (gastroesophageal reflux disease)    prilosec otc-instructed to take dos   Hot flashes 02/19/2014   Hypertension    controlled on hctz-bp at pat 145/94   Neuromuscular disorder (HCC)    firbrmylagia   Obesity    Panic attacks    PONV (postoperative nausea and vomiting)  pt states has not had ponv but has been treated in past with scopolamine, does admit to history of motion sickness, history of vertigo   Vertigo    recent diagnosis of vertigo-uses otc meclizine   Weight gain 02/19/2014    Past Surgical History:  Procedure Laterality Date   ABDOMINAL HYSTERECTOMY     BREAST REDUCTION SURGERY     2010   ECTOPIC PREGNANCY SURGERY     2002   LAPAROSCOPIC ASSISTED VAGINAL HYSTERECTOMY  06/14/2011   Procedure: LAPAROSCOPIC ASSISTED VAGINAL HYSTERECTOMY;  Surgeon:  Luz Lex, MD;  Location: [facility];  Service: Gynecology;  Laterality: N/A;  Abdomen and vagina   LAPAROSCOPY     2006-for adhesions    Current Medications: Current Meds  Medication Sig   ALPRAZolam (XANAX) 0.5 MG tablet Take 0.5 mg by mouth 4 (four) times daily as needed for anxiety.    carvedilol (COREG) 6.25 MG tablet TAKE 1 TABLET BY MOUTH TWICE A DAY   escitalopram (LEXAPRO) 20 MG tablet Take 20 mg by mouth daily.   hydrochlorothiazide (MICROZIDE) 12.5 MG capsule TAKE 2 CAPSULES BY MOUTH EVERY DAY   lansoprazole (PREVACID) 15 MG capsule Take 15 mg by mouth daily in the afternoon.   loratadine (CLARITIN) 10 MG tablet Take 10 mg by mouth daily.   metFORMIN (GLUCOPHAGE) 1000 MG tablet Take 1,000 mg by mouth 2 (two) times daily.   neomycin-polymyxin b-dexamethasone (MAXITROL) 3.5-10000-0.1 SUSP Place 1 drop into both eyes 4 (four) times daily.   OneTouch Delica Lancets 99991111 MISC Inject into the skin daily in the afternoon.   ONETOUCH VERIO test strip 1 each by Other route daily in the afternoon.   pravastatin (PRAVACHOL) 40 MG tablet Take 1 tablet (40 mg total) by mouth every evening.     Allergies:   Carbamazepine, Hydromorphone, Metformin, and Ciprofloxacin   Social History   Socioeconomic History   Marital status: Married    Spouse name: Not on file   Number of children: Not on file   Years of education: Not on file   Highest education level: Not on file  Occupational History   Not on file  Tobacco Use   Smoking status: Never   Smokeless tobacco: Never  Substance and Sexual Activity   Alcohol use: No   Drug use: No   Sexual activity: Yes    Partners: Male    Birth control/protection: Surgical    Comment: married  Other Topics Concern   Not on file  Social History Narrative   Not on file   Social Determinants of Health   Financial Resource Strain: Not on file  Food Insecurity: Not on file  Transportation Needs: Not on file  Physical Activity: Not on file   Stress: Not on file  Social Connections: Not on file     Family History: The patient's family history includes Anxiety disorder in her mother, paternal grandmother, and paternal uncle; Arthritis in her mother, sister, and sister; Cancer in her maternal grandmother and paternal uncle; Depression in her mother; Diabetes in her father, maternal grandfather, paternal grandfather, and paternal grandmother; Hypertension in her father, maternal grandfather, paternal grandfather, and paternal grandmother; Mood Disorder in her brother and maternal uncle; Panic disorder in her sister; Psychosis in her maternal uncle.  ROS:   Please see the history of present illness.     All other systems reviewed and are negative.  EKGs/Labs/Other Studies Reviewed:    The following studies were reviewed today: Renal duplex ultrasound 01/21/2021  Right: Normal size right kidney. Normal right Resistive Index.         Normal cortical thickness of right kidney. No evidence of         right renal artery stenosis. RRV flow present.  Left:  Normal size of left kidney. Normal left Resistive Index.         Normal cortical thickness of the left kidney. 1-59% stenosis         of the left renal artery. LRV flow present. There is a kink         in the vessel at the origin/proximal segment that is likely         causing mildly elevated velocities.  Mesenteric:  Normal Celiac artery and Superior Mesenteric artery findings.  Patent IVC.   EKG:  EKG is ordered today.  It is very similar to previous tracings and shows normal sinus rhythm, normal except for slight delay in R wave progression across the anterior precordium.  QTc 450 ms.  Recent Labs: 07/29/2022: ALT 53; BUN 11; Creatinine, Ser 0.77; Potassium 3.4; Sodium 138; TSH 3.373 09/21/2022: Hemoglobin 14.5; Platelets 235  06/08/2021 hemoglobin A1c 7.1% 02/08/2023  glucose 128, BUN 9, creatinine 0.71, potassium 3.6, AST 59, ALT 50, TSH 2.05, hemoglobin 14.8  Recent  Lipid Panel No results found for: "CHOL", "TRIG", "HDL", "CHOLHDL", "VLDL", "LDLCALC", "LDLDIRECT" July 30, 2019 total cholesterol 216, HDL 43, LDL 125, triglycerides 241 Hemoglobin A1c 6.6% Hemoglobin 15.6, creatinine 0.7, potassium 3.8, ALT 69  11/29/2020 Cholesterol 202, HDL 40, LDL 120, triglycerides 241  02/08/2023  cholesterol 214, HDL 37, LDL 137, triglycerides 216 Physical Exam:    VS:  BP 130/86 (BP Location: Left Arm, Patient Position: Sitting, Cuff Size: Large)   Pulse 80   Ht '5\' 4"'$  (1.626 m)   Wt 246 lb 12.8 oz (111.9 kg)   LMP 05/01/2011   SpO2 96%   BMI 42.36 kg/m     Wt Readings from Last 3 Encounters:  02/08/23 246 lb 12.8 oz (111.9 kg)  07/29/22 235 lb (106.6 kg)  10/26/21 256 lb (116.1 kg)      General: Alert, oriented x3, no distress, morbidly obese Head: no evidence of trauma, PERRL, EOMI, no exophtalmos or lid lag, no myxedema, no xanthelasma; normal ears, nose and oropharynx Neck: normal jugular venous pulsations and no hepatojugular reflux; brisk carotid pulses without delay and no carotid bruits Chest: clear to auscultation, no signs of consolidation by percussion or palpation, normal fremitus, symmetrical and full respiratory excursions Cardiovascular: normal position and quality of the apical impulse, regular rhythm, normal first and second heart sounds, no murmurs, rubs or gallops Abdomen: no tenderness or distention, no masses by palpation, no abnormal pulsatility or arterial bruits, normal bowel sounds, no hepatosplenomegaly Extremities: no clubbing, cyanosis or edema; 2+ radial, ulnar and brachial pulses bilaterally; 2+ right femoral, posterior tibial and dorsalis pedis pulses; 2+ left femoral, posterior tibial and dorsalis pedis pulses; no subclavian or femoral bruits Neurological: grossly nonfocal Psych: Normal mood and affect    ASSESSMENT:    1. Essential hypertension   2. Mixed hyperlipidemia   3. Diabetes mellitus type 2 in obese  ([place])   4. NASH (nonalcoholic steatohepatitis)   5. Morbid obesity ([place])      PLAN:    In order of problems listed above:   HTN: Good control.  Avoid the use of combined RAAS inhibitors and diuretics due to her tendency to develop prerenal azotemia in the past. DM: Worsening control hemoglobin A1c  7.5%. HLP: Lipid profile strongly suggestive of metabolic syndrome/insulin resistance like to have small dense LDL LDL 137 well above target of 104 patient with diabetes, chronically low HDL cholesterol.  Strongly recommend statin therapy.  Discussed the fact that the safety profile of these agents is actually excellent and her benefit in preventing vascular complications is well proven in patients with diabetes mellitus.  To avoid the potential increase in glucose levels with potent statins will try pravastatin 40 mg at bedtime daily.  Recheck labs in about 3 months. NASH: Very likely obesity related fatty liver.  No evidence of parenchymal liver insufficiency or structural changes other than fatty liver.  She does not drink alcohol.  Weight loss is the major way to deal with this. Should even consider bariatric surgery, if conservative efforts at weight loss are not successful Morbid obesity: Should even consider bariatric surgery, if conservative efforts at weight loss are not successful.  However, she needs assistance in achieving emotional stability first, before undergoing evaluation for this.  Encourage enrollment in a formal bariatric program.    Medication Adjustments/Labs and Tests Ordered: Current medicines are reviewed at length with the patient today.  Concerns regarding medicines are outlined above.  Orders Placed This Encounter  Procedures   Lipid panel   Comprehensive metabolic panel   EKG [id number]    Meds ordered this encounter  Medications   pravastatin (PRAVACHOL) 40 MG tablet    Sig: Take 1 tablet (40 mg total) by mouth every evening.    Dispense:  90 tablet    Refill:  3      Patient Instructions  Medication Instructions:  Prevastatin '40mg'$  once a day  *If you need a refill on your cardiac medications before your next appointment, please call your pharmacy*  Lab Work: Lipid Panel and CMET- in 3 months- (Fasting) If you have labs (blood work) drawn today and your tests are completely normal, you will receive your results only by: [place] (if you have MyChart) OR A paper copy in the mail If you have any lab test that is abnormal or we need to change your treatment, we will call you to review the results.  Follow-Up: At [facility], you and your health needs are our priority.  As part of our continuing mission to provide you with exceptional heart care, we have created designated Provider Care Teams.  These Care Teams include your primary Cardiologist (physician) and Advanced Practice Providers (APPs -  Physician Assistants and Nurse Practitioners) who all work together to provide you with the care you need, when you need it.  We recommend signing up for the patient portal called "MyChart".  Sign up information is provided on this After Visit Summary.  MyChart is used to connect with patients for Virtual Visits (Telemedicine).  Patients are able to view lab/test results, encounter notes, upcoming appointments, etc.  Non-urgent messages can be sent to your provider as well.   To learn more about what you can do with MyChart, go to [email or web address].    Your next appointment:   1 year(s)  Provider:   Sanda Klein, MD       Signed, Sanda Klein, MD  02/08/2023 3:03 PM    [facility]

## 2023-02-17 ENCOUNTER — Encounter: Payer: Self-pay | Admitting: Cardiovascular Disease

## 2023-02-19 ENCOUNTER — Telehealth: Payer: Self-pay | Admitting: Podiatry

## 2023-02-19 MED ORDER — METHYLPREDNISOLONE 4 MG PO TBPK: ORAL_TABLET | ORAL | 0 refills | Status: DC

## 2023-02-19 NOTE — Telephone Encounter (Signed)
Pt stated that she is having a gout flare-up in her left great toe, very painful down to the bone, hurts to walk. She uses the [facility] at [address] [place]. Please advise

## 2023-02-20 ENCOUNTER — Other Ambulatory Visit: Payer: Self-pay

## 2023-02-20 MED ORDER — EZETIMIBE 10 MG PO TABS: 10.0000 mg | ORAL_TABLET | Freq: Every day | ORAL | 3 refills | Status: DC

## 2023-02-20 NOTE — Telephone Encounter (Signed)
With calcium score of zero, the need for lipid lowering is less urgent, but eventually we will need to find a solution to the high cholesterol levels, ideally mostly through diet and exercise. If willing, please have her start ezetimibe 10 mg daily, much less likely to cause side effects, but also weaker.

## 2023-02-21 ENCOUNTER — Encounter: Payer: Self-pay | Admitting: Cardiovascular Disease

## 2023-03-13 DIAGNOSIS — N644 Mastodynia: Secondary | ICD-10-CM | POA: Diagnosis not present

## 2023-03-14 DIAGNOSIS — D518 Other vitamin B12 deficiency anemias: Secondary | ICD-10-CM | POA: Diagnosis not present

## 2023-03-15 DIAGNOSIS — N644 Mastodynia: Secondary | ICD-10-CM | POA: Diagnosis not present

## 2023-03-15 DIAGNOSIS — R928 Other abnormal and inconclusive findings on diagnostic imaging of breast: Secondary | ICD-10-CM | POA: Diagnosis not present

## 2023-03-15 DIAGNOSIS — N6001 Solitary cyst of right breast: Secondary | ICD-10-CM | POA: Diagnosis not present

## 2023-03-16 DIAGNOSIS — I10 Essential (primary) hypertension: Secondary | ICD-10-CM | POA: Diagnosis not present

## 2023-03-16 DIAGNOSIS — E119 Type 2 diabetes mellitus without complications: Secondary | ICD-10-CM | POA: Diagnosis not present

## 2023-03-16 DIAGNOSIS — E1159 Type 2 diabetes mellitus with other circulatory complications: Secondary | ICD-10-CM | POA: Diagnosis not present

## 2023-03-16 DIAGNOSIS — Z6841 Body Mass Index (BMI) 40.0 and over, adult: Secondary | ICD-10-CM | POA: Diagnosis not present

## 2023-04-16 ENCOUNTER — Ambulatory Visit (INDEPENDENT_AMBULATORY_CARE_PROVIDER_SITE_OTHER): Payer: Medicare HMO | Admitting: Psychiatry

## 2023-04-16 ENCOUNTER — Encounter: Payer: Self-pay | Admitting: Psychiatry

## 2023-04-16 DIAGNOSIS — F4310 Post-traumatic stress disorder, unspecified: Secondary | ICD-10-CM

## 2023-04-16 DIAGNOSIS — F39 Unspecified mood [affective] disorder: Secondary | ICD-10-CM

## 2023-04-16 DIAGNOSIS — F411 Generalized anxiety disorder: Secondary | ICD-10-CM | POA: Diagnosis not present

## 2023-04-16 DIAGNOSIS — F4010 Social phobia, unspecified: Secondary | ICD-10-CM | POA: Diagnosis not present

## 2023-04-16 MED ORDER — BUPROPION HCL ER (XL) 150 MG PO TB24: 150.0000 mg | ORAL_TABLET | Freq: Every morning | ORAL | 1 refills | Status: DC

## 2023-04-16 NOTE — Patient Instructions (Signed)
[facility] [place] [address] [place],  [place]  [address] [phone] 
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
## 2023-05-04 ENCOUNTER — Other Ambulatory Visit: Payer: Self-pay | Admitting: Cardiovascular Disease

## 2023-05-12 ENCOUNTER — Other Ambulatory Visit: Payer: Self-pay | Admitting: Psychiatry

## 2023-05-12 DIAGNOSIS — F39 Unspecified mood [affective] disorder: Secondary | ICD-10-CM

## 2023-05-14 ENCOUNTER — Ambulatory Visit: Payer: Medicare HMO | Admitting: Psychiatry

## 2023-05-15 ENCOUNTER — Encounter: Payer: Self-pay | Admitting: Emergency Medicine

## 2023-05-15 ENCOUNTER — Encounter: Payer: Self-pay | Admitting: Cardiovascular Disease

## 2023-05-16 DIAGNOSIS — E782 Mixed hyperlipidemia: Secondary | ICD-10-CM | POA: Diagnosis not present

## 2023-05-17 LAB — COMPREHENSIVE METABOLIC PANEL
ALT: 46 IU/L — ABNORMAL HIGH (ref 0–32)
AST: 45 IU/L — ABNORMAL HIGH (ref 0–40)
Albumin/Globulin Ratio: 1.7
Albumin: 4.3 g/dL (ref 3.8–4.9)
Alkaline Phosphatase: 119 IU/L (ref 44–121)
BUN/Creatinine Ratio: 10 (ref 9–23)
BUN: 8 mg/dL (ref 6–24)
Bilirubin Total: 0.5 mg/dL (ref 0.0–1.2)
CO2: 25 mmol/L (ref 20–29)
Calcium: 9.4 mg/dL (ref 8.7–10.2)
Chloride: 97 mmol/L (ref 96–106)
Creatinine, Ser: 0.8 mg/dL (ref 0.57–1.00)
Globulin, Total: 2.6 g/dL (ref 1.5–4.5)
Glucose: 145 mg/dL — ABNORMAL HIGH (ref 70–99)
Potassium: 3.7 mmol/L (ref 3.5–5.2)
Sodium: 140 mmol/L (ref 134–144)
Total Protein: 6.9 g/dL (ref 6.0–8.5)
eGFR: 87 mL/min/{1.73_m2} (ref >59)

## 2023-05-17 LAB — LIPID PANEL
Chol/HDL Ratio: 5.8 ratio — ABNORMAL HIGH (ref 0.0–4.4)
Cholesterol, Total: 210 mg/dL — ABNORMAL HIGH (ref 100–199)
HDL: 36 mg/dL — ABNORMAL LOW (ref >39)
LDL Chol Calc (NIH): 122 mg/dL — ABNORMAL HIGH (ref 0–99)
Triglycerides: 297 mg/dL — ABNORMAL HIGH (ref 0–149)
VLDL Cholesterol Cal: 52 mg/dL — ABNORMAL HIGH (ref 5–40)

## 2023-05-18 ENCOUNTER — Encounter: Payer: Self-pay | Admitting: Cardiovascular Disease

## 2023-05-21 ENCOUNTER — Encounter: Payer: Self-pay | Admitting: Cardiovascular Disease

## 2023-05-21 ENCOUNTER — Telehealth: Payer: Self-pay | Admitting: Cardiovascular Disease

## 2023-05-21 DIAGNOSIS — E782 Mixed hyperlipidemia: Secondary | ICD-10-CM

## 2023-05-21 NOTE — Telephone Encounter (Signed)
CMET and lipid please 

## 2023-05-21 NOTE — Telephone Encounter (Signed)
Pt c/o medication issue:  1. Name of Medication:   ezetimibe (ZETIA) 10 MG tablet  pravastatin (PRAVACHOL) 40 MG tablet   2. How are you currently taking this medication (dosage and times per day)?    3. Are you having a reaction (difficulty breathing--STAT)?   4. What is your medication issue?   Patient wants to only take prarastatin as she believe the ezetimibe is causing her to sweat profusely. Patient stated she had stopped taking pravastatin (PRAVACHOL) 40 MG tablet a couple of months ago.

## 2023-05-21 NOTE — Telephone Encounter (Signed)
Per Dr croitoru  Pravastatin can be taken alone and will typically reduce the LDL by 30-35% (which may or may not be enough). Adding zetia will reduce the LDL by about 50%. I would suggest taking both for 2-3 months and then we can see if dropping the zetia is a good idea.     Patient notified via My Chart messages

## 2023-05-21 NOTE — Telephone Encounter (Signed)
Pravastatin can be taken alone and will typically reduce the LDL by 30-35% (which may or may not be enough). Adding zetia will reduce the LDL by about 50%. I would suggest taking both for 2-3 months and then we can see if dropping the zetia is a good idea.

## 2023-05-21 NOTE — Telephone Encounter (Signed)
Labs ordered/mailed.

## 2023-05-24 ENCOUNTER — Other Ambulatory Visit: Payer: Self-pay | Admitting: *Deleted

## 2023-05-24 ENCOUNTER — Telehealth: Payer: Self-pay | Admitting: Podiatry

## 2023-05-24 ENCOUNTER — Encounter: Payer: Self-pay | Admitting: Podiatry

## 2023-05-24 DIAGNOSIS — M109 Gout, unspecified: Secondary | ICD-10-CM

## 2023-05-24 MED ORDER — METHYLPREDNISOLONE 4 MG PO TBPK: ORAL_TABLET | ORAL | 0 refills | Status: DC

## 2023-05-24 NOTE — Telephone Encounter (Addendum)
Pt was calling requesting RX Prednisone. Pt stated she's having a hard time walking.    Please advise

## 2023-05-24 NOTE — Telephone Encounter (Signed)
Pt called again about the Rx request

## 2023-05-25 ENCOUNTER — Encounter: Payer: Self-pay | Admitting: Podiatry

## 2023-05-25 LAB — CBC WITH DIFFERENTIAL/PLATELET
Basophils Absolute: 0.1 10*3/uL (ref 0.0–0.2)
Basos: 1 %
EOS (ABSOLUTE): 0.1 10*3/uL (ref 0.0–0.4)
Eos: 1 %
Hematocrit: 46 % (ref 34.0–46.6)
Hemoglobin: 15.4 g/dL (ref 11.1–15.9)
Immature Grans (Abs): 0.1 10*3/uL (ref 0.0–0.1)
Immature Granulocytes: 1 %
Lymphocytes Absolute: 2.9 10*3/uL (ref 0.7–3.1)
Lymphs: 25 %
MCH: 28.9 pg (ref 26.6–33.0)
MCHC: 33.5 g/dL (ref 31.5–35.7)
MCV: 86 fL (ref 79–97)
Monocytes Absolute: 0.7 10*3/uL (ref 0.1–0.9)
Monocytes: 6 %
Neutrophils Absolute: 7.8 10*3/uL — ABNORMAL HIGH (ref 1.4–7.0)
Neutrophils: 66 %
Platelets: 287 10*3/uL (ref 150–450)
RBC: 5.33 x10E6/uL — ABNORMAL HIGH (ref 3.77–5.28)
RDW: 14 % (ref 11.7–15.4)
WBC: 11.5 10*3/uL — ABNORMAL HIGH (ref 3.4–10.8)

## 2023-05-25 LAB — SEDIMENTATION RATE: Sed Rate: 24 mm/hr (ref 0–40)

## 2023-05-25 LAB — URIC ACID: Uric Acid: 10.9 mg/dL — ABNORMAL HIGH (ref 3.0–7.2)

## 2023-05-25 LAB — C-REACTIVE PROTEIN: CRP: 18 mg/L — ABNORMAL HIGH (ref 0–10)

## 2023-05-25 LAB — RHEUMATOID FACTOR: Rheumatoid fact SerPl-aCnc: <10 IU/mL (ref <14.0)

## 2023-05-25 LAB — ANA: Anti Nuclear Antibody (ANA): NEGATIVE

## 2023-05-30 ENCOUNTER — Other Ambulatory Visit: Payer: Self-pay | Admitting: Podiatry

## 2023-05-30 MED ORDER — ALLOPURINOL 100 MG PO TABS: 100.0000 mg | ORAL_TABLET | Freq: Two times a day (BID) | ORAL | 6 refills | Status: DC

## 2023-06-05 ENCOUNTER — Telehealth: Payer: Self-pay | Admitting: *Deleted

## 2023-06-05 ENCOUNTER — Encounter: Payer: Self-pay | Admitting: Cardiovascular Disease

## 2023-06-05 MED ORDER — ALLOPURINOL 100 MG PO TABS: 100.0000 mg | ORAL_TABLET | Freq: Two times a day (BID) | ORAL | 1 refills | Status: DC

## 2023-06-05 NOTE — Telephone Encounter (Signed)
-----   Message from Elinor Parkinson, [place] sent at 05/30/2023  7:53 AM EDT ----- Please let her know that we need to get her started on allopurinol 100mg  bid and needs a follow up appointment in one month for blood work.

## 2023-06-06 ENCOUNTER — Encounter: Payer: Self-pay | Admitting: Podiatry

## 2023-06-06 NOTE — Telephone Encounter (Signed)
Unfortunately , all diuretics increase uric acid levels, without exception. It's not that it is contraindicated to mix the hydrochlorothiazide with allopurinol, it's just a warning that hydrochlorothiazide increases the uric acid level. I believe that we tried stopping the hydrochlorothiazide in the past and Mrs. Amy [name] had problems with swelling? I amOK with trying to stop the hydrochlorothiazide if she agrees.

## 2023-07-04 ENCOUNTER — Encounter: Payer: Self-pay | Admitting: Podiatry

## 2023-07-04 ENCOUNTER — Ambulatory Visit: Payer: Medicare HMO | Admitting: Podiatry

## 2023-07-06 DIAGNOSIS — E039 Hypothyroidism, unspecified: Secondary | ICD-10-CM | POA: Diagnosis not present

## 2023-07-06 DIAGNOSIS — Z6839 Body mass index (BMI) 39.0-39.9, adult: Secondary | ICD-10-CM | POA: Diagnosis not present

## 2023-07-06 DIAGNOSIS — R748 Abnormal levels of other serum enzymes: Secondary | ICD-10-CM | POA: Diagnosis not present

## 2023-07-06 DIAGNOSIS — F419 Anxiety disorder, unspecified: Secondary | ICD-10-CM | POA: Diagnosis not present

## 2023-07-06 DIAGNOSIS — I10 Essential (primary) hypertension: Secondary | ICD-10-CM | POA: Diagnosis not present

## 2023-07-06 DIAGNOSIS — D518 Other vitamin B12 deficiency anemias: Secondary | ICD-10-CM | POA: Diagnosis not present

## 2023-07-06 DIAGNOSIS — E6609 Other obesity due to excess calories: Secondary | ICD-10-CM | POA: Diagnosis not present

## 2023-07-06 DIAGNOSIS — E559 Vitamin D deficiency, unspecified: Secondary | ICD-10-CM | POA: Diagnosis not present

## 2023-07-06 DIAGNOSIS — E1159 Type 2 diabetes mellitus with other circulatory complications: Secondary | ICD-10-CM | POA: Diagnosis not present

## 2023-07-15 ENCOUNTER — Encounter: Payer: Self-pay | Admitting: Podiatry

## 2023-07-16 ENCOUNTER — Emergency Department (HOSPITAL_BASED_OUTPATIENT_CLINIC_OR_DEPARTMENT_OTHER): Payer: Medicare HMO

## 2023-07-16 ENCOUNTER — Emergency Department (HOSPITAL_BASED_OUTPATIENT_CLINIC_OR_DEPARTMENT_OTHER)
Admission: EM | Admit: 2023-07-16 | Discharge: 2023-07-16 | Disposition: A | Discharge status: Home / Self Care | Payer: Medicare HMO | Attending: Emergency Medicine | Admitting: Emergency Medicine

## 2023-07-16 ENCOUNTER — Encounter (HOSPITAL_BASED_OUTPATIENT_CLINIC_OR_DEPARTMENT_OTHER): Payer: Self-pay

## 2023-07-16 ENCOUNTER — Encounter: Payer: Self-pay | Admitting: Internal Medicine

## 2023-07-16 ENCOUNTER — Ambulatory Visit: Payer: Medicare HMO | Admitting: Podiatry

## 2023-07-16 ENCOUNTER — Other Ambulatory Visit: Payer: Self-pay

## 2023-07-16 ENCOUNTER — Other Ambulatory Visit (HOSPITAL_BASED_OUTPATIENT_CLINIC_OR_DEPARTMENT_OTHER): Payer: Self-pay

## 2023-07-16 DIAGNOSIS — Z794 Long term (current) use of insulin: Secondary | ICD-10-CM | POA: Diagnosis not present

## 2023-07-16 DIAGNOSIS — Z79899 Other long term (current) drug therapy: Secondary | ICD-10-CM | POA: Diagnosis not present

## 2023-07-16 DIAGNOSIS — R079 Chest pain, unspecified: Secondary | ICD-10-CM | POA: Diagnosis not present

## 2023-07-16 DIAGNOSIS — Z7984 Long term (current) use of oral hypoglycemic drugs: Secondary | ICD-10-CM | POA: Diagnosis not present

## 2023-07-16 DIAGNOSIS — I10 Essential (primary) hypertension: Secondary | ICD-10-CM | POA: Diagnosis not present

## 2023-07-16 DIAGNOSIS — K30 Functional dyspepsia: Secondary | ICD-10-CM | POA: Diagnosis not present

## 2023-07-16 DIAGNOSIS — N281 Cyst of kidney, acquired: Secondary | ICD-10-CM | POA: Diagnosis not present

## 2023-07-16 DIAGNOSIS — Z90710 Acquired absence of both cervix and uterus: Secondary | ICD-10-CM | POA: Diagnosis not present

## 2023-07-16 DIAGNOSIS — R109 Unspecified abdominal pain: Secondary | ICD-10-CM

## 2023-07-16 DIAGNOSIS — E119 Type 2 diabetes mellitus without complications: Secondary | ICD-10-CM | POA: Insufficient documentation

## 2023-07-16 DIAGNOSIS — R1013 Epigastric pain: Secondary | ICD-10-CM | POA: Diagnosis not present

## 2023-07-16 DIAGNOSIS — R1011 Right upper quadrant pain: Secondary | ICD-10-CM | POA: Insufficient documentation

## 2023-07-16 LAB — URINALYSIS, ROUTINE W REFLEX MICROSCOPIC
Bilirubin Urine: NEGATIVE
Glucose, UA: NEGATIVE mg/dL
Hgb urine dipstick: NEGATIVE
Ketones, ur: NEGATIVE mg/dL
Leukocytes,Ua: NEGATIVE
Nitrite: NEGATIVE
Protein, ur: NEGATIVE mg/dL
Specific Gravity, Urine: <1.005 — ABNORMAL LOW (ref 1.005–1.030)
pH: 7 (ref 5.0–8.0)

## 2023-07-16 LAB — COMPREHENSIVE METABOLIC PANEL
ALT: 35 U/L (ref 0–44)
AST: 45 U/L — ABNORMAL HIGH (ref 15–41)
Albumin: 4.6 g/dL (ref 3.5–5.0)
Alkaline Phosphatase: 88 U/L (ref 38–126)
Anion gap: 10 (ref 5–15)
BUN: 7 mg/dL (ref 6–20)
CO2: 32 mmol/L (ref 22–32)
Calcium: 9.8 mg/dL (ref 8.9–10.3)
Chloride: 96 mmol/L — ABNORMAL LOW (ref 98–111)
Creatinine, Ser: 0.79 mg/dL (ref 0.44–1.00)
GFR, Estimated: >60 mL/min (ref >60)
Glucose, Bld: 140 mg/dL — ABNORMAL HIGH (ref 70–99)
Potassium: 3.7 mmol/L (ref 3.5–5.1)
Sodium: 138 mmol/L (ref 135–145)
Total Bilirubin: 0.7 mg/dL (ref 0.3–1.2)
Total Protein: 7.3 g/dL (ref 6.5–8.1)

## 2023-07-16 LAB — DIFFERENTIAL
Abs Immature Granulocytes: 0.08 10*3/uL — ABNORMAL HIGH (ref 0.00–0.07)
Basophils Absolute: 0.1 10*3/uL (ref 0.0–0.1)
Basophils Relative: 1 %
Eosinophils Absolute: 0.2 10*3/uL (ref 0.0–0.5)
Eosinophils Relative: 2 %
Immature Granulocytes: 1 %
Lymphocytes Relative: 27 %
Lymphs Abs: 3.3 10*3/uL (ref 0.7–4.0)
Monocytes Absolute: 0.7 10*3/uL (ref 0.1–1.0)
Monocytes Relative: 5 %
Neutro Abs: 8 10*3/uL — ABNORMAL HIGH (ref 1.7–7.7)
Neutrophils Relative %: 64 %

## 2023-07-16 LAB — CBC
HCT: 45.6 % (ref 36.0–46.0)
Hemoglobin: 15.2 g/dL — ABNORMAL HIGH (ref 12.0–15.0)
MCH: 28.6 pg (ref 26.0–34.0)
MCHC: 33.3 g/dL (ref 30.0–36.0)
MCV: 85.7 fL (ref 80.0–100.0)
Platelets: 257 10*3/uL (ref 150–400)
RBC: 5.32 MIL/uL — ABNORMAL HIGH (ref 3.87–5.11)
RDW: 14.1 % (ref 11.5–15.5)
WBC: 12.4 10*3/uL — ABNORMAL HIGH (ref 4.0–10.5)
nRBC: 0 % (ref 0.0–0.2)

## 2023-07-16 LAB — TROPONIN I (HIGH SENSITIVITY): Troponin I (High Sensitivity): <2 ng/L (ref <18)

## 2023-07-16 LAB — LIPASE, BLOOD: Lipase: 35 U/L (ref 11–51)

## 2023-07-16 MED ORDER — KETOROLAC TROMETHAMINE 15 MG/ML IJ SOLN
15.0000 mg | Freq: Once | INTRAMUSCULAR | Status: AC
  Administered 2023-07-16: 15 mg via INTRAVENOUS
  Filled 2023-07-16: qty 1

## 2023-07-16 MED ORDER — ONDANSETRON HCL 4 MG/2ML IJ SOLN
4.0000 mg | Freq: Once | INTRAMUSCULAR | Status: AC
  Administered 2023-07-16: 4 mg via INTRAVENOUS
  Filled 2023-07-16: qty 2

## 2023-07-16 MED ORDER — SODIUM CHLORIDE 0.9 % IV BOLUS
1000.0000 mL | Freq: Once | INTRAVENOUS | Status: AC
  Administered 2023-07-16: 1000 mL via INTRAVENOUS

## 2023-07-16 MED ORDER — IOHEXOL 300 MG/ML  SOLN
100.0000 mL | Freq: Once | INTRAMUSCULAR | Status: AC | PRN
  Administered 2023-07-16: 85 mL via INTRAVENOUS

## 2023-07-16 MED ORDER — SUCRALFATE 1 G PO TABS
1.0000 g | ORAL_TABLET | Freq: Three times a day (TID) | ORAL | 0 refills | Status: DC
  Filled 2023-07-16: qty 21, 7d supply, fill #0

## 2023-07-16 MED ORDER — ONDANSETRON HCL 4 MG PO TABS
4.0000 mg | ORAL_TABLET | ORAL | 0 refills | Status: DC | PRN
  Filled 2023-07-16: qty 6, 1d supply, fill #0

## 2023-07-16 MED ORDER — ALUM & MAG HYDROXIDE-SIMETH 200-200-20 MG/5ML PO SUSP
30.0000 mL | Freq: Once | ORAL | Status: AC
  Administered 2023-07-16: 30 mL via ORAL
  Filled 2023-07-16: qty 30

## 2023-07-16 NOTE — ED Triage Notes (Signed)
Complaining of nausea, abdominal pain in the right upper quadrant that radiates to the back, worried about gallstones.  Also having a ache through the hips

## 2023-07-16 NOTE — Discharge Instructions (Addendum)
It was a pleasure caring for you today in the emergency department.  Please return to the emergency department for any worsening or worrisome symptoms.  Please call gastroenterology for follow-up, may benefit from endoscopy.

## 2023-07-16 NOTE — ED Provider Notes (Signed)
[place] [facility] AT [facility] Provider Note  CSN: [id number] Arrival date & time: 07/16/23 1008  Chief Complaint(s) Abdominal Pain  HPI Amy [name] is a 57 y.o. female with past medical history as below, significant for DM, anxiety, HTN, fibromyalgia, GERD who presents to the ED with complaint of epig/ruq pain.  Onset around 3 days ago, ruq / epig burning sharp, some nausea/no vomiting. Ate applesauce this AM which seemed to make the pain better. Having some mid sternal discomfort with the epig pain as well, constant. Took antacid pta which did not help. No fevers, no leg swelling, no jaundice. No melena or brbpr, no excessive etoh use or daily nsaid use   Past Medical History Past Medical History:  Diagnosis Date   Anemia    Anxiety    xanax prn   Blood transfusion without reported diagnosis    Degenerative disc disease    Diabetes mellitus without complication (HCC)    Fatty liver    Fibromyalgia    GERD (gastroesophageal reflux disease)    prilosec otc-instructed to take dos   Hot flashes 02/19/2014   Hypertension    controlled on hctz-bp at pat 145/94   Neuromuscular disorder (HCC)    firbrmylagia   Obesity    Panic attacks    PONV (postoperative nausea and vomiting)    pt states has not had ponv but has been treated in past with scopolamine, does admit to history of motion sickness, history of vertigo   Vertigo    recent diagnosis of vertigo-uses otc meclizine   Weight gain 02/19/2014   Patient Active Problem List   Diagnosis Date Noted   Essential hypertension 10/28/2021   Mixed hyperlipidemia 10/28/2021   Diabetes mellitus type 2 in obese 10/28/2021   NASH (nonalcoholic steatohepatitis) 10/28/2021   Morbid obesity (HCC) 11/17/2019   Insomnia 11/20/2018   Abscess of vagina 11/16/2018   Social phobia 08/24/2018   Panic disorder 08/24/2018   Night sweats 03/19/2017   Screening for colorectal cancer 03/19/2017   Rectocele 03/19/2017    Rectal irritation 03/19/2017   Hot flashes 02/19/2014   Weight gain 02/19/2014   Internal hemorrhoid 02/19/2014   Home Medication(s) Prior to Admission medications   Medication Sig Start Date End Date Taking? Authorizing Provider  ondansetron (ZOFRAN) 4 MG tablet Take 1 tablet (4 mg total) by mouth every 4 (four) hours as needed for nausea or vomiting. 07/16/23  Yes Tanda Rockers A, DO  sucralfate (CARAFATE) 1 g tablet Take 1 tablet (1 g total) by mouth with breakfast, with lunch, and with evening meal for 7 days. 07/16/23 07/23/23 Yes Sloan Leiter, DO  allopurinol (ZYLOPRIM) 100 MG tablet Take 1 tablet (100 mg total) by mouth 2 (two) times daily. 06/05/23   Hyatt, Max T, DPM  ALPRAZolam (XANAX) 0.5 MG tablet Take 0.5 mg by mouth 4 (four) times daily as needed for anxiety.     [provider]  buPROPion (WELLBUTRIN XL) 150 MG 24 hr tablet Take 1 tablet (150 mg total) by mouth every morning. 04/16/23   Corie Chiquito, PMHNP  carvedilol (COREG) 6.25 MG tablet TAKE 1 TABLET BY MOUTH TWICE A DAY 05/04/23   Croitoru, Mihai, MD  escitalopram (LEXAPRO) 20 MG tablet Take 20 mg by mouth daily.    [provider]  ezetimibe (ZETIA) 10 MG tablet Take 1 tablet (10 mg total) by mouth daily. Patient not taking: Reported on 04/16/2023 02/20/23 05/21/23  Croitoru, Mihai, MD  hydrochlorothiazide (MICROZIDE) 12.5 MG  capsule TAKE 2 CAPSULES BY MOUTH EVERY DAY 12/05/22   Croitoru, Mihai, MD  hydrocortisone 2.5 % ointment Apply topically as needed. Patient not taking: Reported on 02/08/2023 01/24/23   [provider]  lansoprazole (PREVACID) 15 MG capsule Take 15 mg by mouth daily in the afternoon. 02/17/22   [provider]  loratadine (CLARITIN) 10 MG tablet Take 10 mg by mouth daily.    [provider]  metFORMIN (GLUCOPHAGE) 1000 MG tablet Take 1,000 mg by mouth 2 (two) times daily. 07/18/22   [provider]  methylPREDNISolone (MEDROL DOSEPAK) 4 MG TBPK tablet 6 day  dose pack - take as directed 05/24/23   Hyatt, Max T, DPM  OneTouch Delica Lancets 30G MISC Inject into the skin daily in the afternoon. 10/07/18   [provider]  [facility] VERIO test strip 1 each by Other route daily in the afternoon. 11/06/22   [provider]  pravastatin (PRAVACHOL) 40 MG tablet Take 1 tablet (40 mg total) by mouth every evening. Patient not taking: Reported on 04/16/2023 02/08/23 02/03/24  Croitoru, Rachelle Hora, MD  Semaglutide,0.25 or 0.5MG /DOS, 2 MG/1.5ML SOPN Inject into the skin. 01/31/21   [provider]  allopurinol (ZYLOPRIM) 100 MG tablet Take 1 tablet (100 mg total) by mouth 2 (two) times daily. 05/30/23   Elinor Parkinson, DPM                                                                                                                                    Past Surgical History Past Surgical History:  Procedure Laterality Date   ABDOMINAL HYSTERECTOMY     BREAST REDUCTION SURGERY     2010   ECTOPIC PREGNANCY SURGERY     2002   LAPAROSCOPIC ASSISTED VAGINAL HYSTERECTOMY  06/14/2011   Procedure: LAPAROSCOPIC ASSISTED VAGINAL HYSTERECTOMY;  Surgeon: Turner Daniels, MD;  Location: [facility];  Service: Gynecology;  Laterality: N/A;  Abdomen and vagina   LAPAROSCOPY     2006-for adhesions   Family History Family History  Problem Relation Age of Onset   Cancer Paternal Uncle        liver    Cancer Maternal Grandmother        throat   Arthritis Mother    Anxiety disorder Mother    Depression Mother    Diabetes Father    Hypertension Father    Arthritis Sister    Diabetes Maternal Grandfather    Hypertension Maternal Grandfather    Diabetes Paternal Grandmother    Hypertension Paternal Grandmother    Anxiety disorder Paternal Grandmother    Diabetes Paternal Grandfather    Hypertension Paternal Grandfather    Mood Disorder Brother    Arthritis Sister    Panic disorder Sister    Psychosis Maternal Uncle    Mood Disorder Maternal Uncle    Anxiety  disorder Paternal Uncle     Social History Social History  Tobacco Use   Smoking status: Never   Smokeless tobacco: Never  Substance Use Topics   Alcohol use: No   Drug use: No   Allergies Carbamazepine, Hydromorphone, Metformin, and Ciprofloxacin  Review of Systems Review of Systems  Constitutional:  Negative for chills and fever.  HENT:  Negative for facial swelling and trouble swallowing.   Eyes:  Negative for photophobia and visual disturbance.  Respiratory:  Positive for chest tightness. Negative for cough and shortness of breath.   Cardiovascular:  Negative for chest pain and palpitations.  Gastrointestinal:  Positive for abdominal pain and nausea. Negative for vomiting.  Endocrine: Negative for polydipsia and polyuria.  Genitourinary:  Negative for difficulty urinating and hematuria.  Musculoskeletal:  Negative for gait problem and joint swelling.  Skin:  Negative for pallor and rash.  Neurological:  Negative for syncope and headaches.  Psychiatric/Behavioral:  Negative for agitation and confusion.     Physical Exam Vital Signs  I have reviewed the triage vital signs BP 109/71   Pulse 76   Temp 98.1 F (36.7 C) (Oral)   Resp 15   Ht 5\' 4"  (1.626 m)   Wt 108.9 kg   LMP 05/01/2011   SpO2 95%   BMI 41.20 kg/m  Physical Exam Vitals and nursing note reviewed.  Constitutional:      General: She is not in acute distress.    Appearance: Normal appearance. She is well-developed. She is obese.  HENT:     Head: Normocephalic and atraumatic.     Right Ear: External ear normal.     Left Ear: External ear normal.     Nose: Nose normal.     Mouth/Throat:     Mouth: Mucous membranes are moist.  Eyes:     General: No scleral icterus.       Right eye: No discharge.        Left eye: No discharge.     Conjunctiva/sclera: Conjunctivae normal.  Cardiovascular:     Rate and Rhythm: Normal rate and regular rhythm.     Pulses: Normal pulses.     Heart sounds: Normal  heart sounds.  Pulmonary:     Effort: Pulmonary effort is normal. No respiratory distress.     Breath sounds: Normal breath sounds. No stridor.  Abdominal:     General: Abdomen is flat. There is no distension.     Palpations: Abdomen is soft.     Tenderness: There is abdominal tenderness in the right upper quadrant and epigastric area. There is no guarding or rebound. Positive signs include Murphy's sign.  Musculoskeletal:     Cervical back: No rigidity.     Right lower leg: No edema.     Left lower leg: No edema.  Skin:    General: Skin is warm and dry.     Capillary Refill: Capillary refill takes less than 2 seconds.  Neurological:     Mental Status: She is alert.  Psychiatric:        Mood and Affect: Mood normal.        Behavior: Behavior normal. Behavior is cooperative.     ED Results and Treatments Labs (all labs ordered are listed, but only abnormal results are displayed) Labs Reviewed  COMPREHENSIVE METABOLIC PANEL - Abnormal; Notable for the following components:      Result Value   Chloride 96 (*)    Glucose, Bld 140 (*)    AST 45 (*)    All other components within normal limits  CBC -  Abnormal; Notable for the following components:   WBC 12.4 (*)    RBC 5.32 (*)    Hemoglobin 15.2 (*)    All other components within normal limits  URINALYSIS, ROUTINE W REFLEX MICROSCOPIC - Abnormal; Notable for the following components:   Color, Urine COLORLESS (*)    Specific Gravity, Urine <1.005 (*)    All other components within normal limits  DIFFERENTIAL - Abnormal; Notable for the following components:   Neutro Abs 8.0 (*)    Abs Immature Granulocytes 0.08 (*)    All other components within normal limits  LIPASE, BLOOD  TROPONIN I (HIGH SENSITIVITY)                                                                                                                          Radiology CT ABDOMEN PELVIS W CONTRAST  Result Date: 07/16/2023 CLINICAL DATA:  Right upper  quadrant pain radiating to the back. EXAM: CT ABDOMEN AND PELVIS WITH CONTRAST TECHNIQUE: Multidetector CT imaging of the abdomen and pelvis was performed using the standard protocol following bolus administration of intravenous contrast. RADIATION DOSE REDUCTION: This exam was performed according to the departmental dose-optimization program which includes automated exposure control, adjustment of the mA and/or kV according to patient size and/or use of iterative reconstruction technique. CONTRAST:  85mL OMNIPAQUE IOHEXOL 300 MG/ML  SOLN COMPARISON:  Ultrasound 07/29/2022.  CT scan 06/01/2020 FINDINGS: Lower chest: Lung bases are clear.  No pleural effusion. Hepatobiliary: No focal liver abnormality is seen. No gallstones, gallbladder wall thickening, or biliary dilatation. Patent portal vein Pancreas: Unremarkable. No pancreatic ductal dilatation or surrounding inflammatory changes. Spleen: Normal in size without focal abnormality. Adrenals/Urinary Tract: 12 mm low-attenuation upper pole left-sided renal cysts identified, Bosniak 1 lesion. No specific imaging follow-up. No enhancing renal mass or collecting system dilatation. The ureters have normal course and caliber extending down to the bladder. Preserved contours of the urinary bladder Stomach/Bowel: Stomach is contracted. Small bowel is nondilated. Moderate colonic stool. Normal appendix. No free air or free fluid few colonic diverticula. Vascular/Lymphatic: No significant vascular findings are present. No enlarged abdominal or pelvic lymph nodes. Reproductive: Status post hysterectomy. No adnexal masses. Other: No abdominal wall hernia or abnormality. No abdominopelvic ascites. Musculoskeletal: Mild degenerative changes along the spine. IMPRESSION: No bowel obstruction, free air or free fluid. Mild stool. Normal appendix. Electronically Signed   By: Karen Kays M.D.   On: 07/16/2023 12:31   DG Chest Portable 1 View  Result Date: 07/16/2023 CLINICAL  DATA:  Chest pain EXAM: PORTABLE CHEST 1 VIEW COMPARISON:  07/29/2022 FINDINGS: The heart size and mediastinal contours are within normal limits. Both lungs are clear. The visualized skeletal structures are unremarkable. IMPRESSION: No active cardiopulmonary disease. Electronically Signed   By: Ernie Avena M.D.   On: 07/16/2023 11:55    Pertinent labs & imaging results that were available during my care of the patient were reviewed by me and considered in my medical decision making (  see MDM for details).  Medications Ordered in ED Medications  sodium chloride 0.9 % bolus 1,000 mL (0 mLs Intravenous Stopped 07/16/23 1326)  alum & mag hydroxide-simeth (MAALOX/MYLANTA) 200-200-20 MG/5ML suspension 30 mL (30 mLs Oral Given 07/16/23 1116)  ketorolac (TORADOL) 15 MG/ML injection 15 mg (15 mg Intravenous Given 07/16/23 1109)  ondansetron (ZOFRAN) injection 4 mg (4 mg Intravenous Given 07/16/23 1124)  iohexol (OMNIPAQUE) 300 MG/ML solution 100 mL (85 mLs Intravenous Contrast Given 07/16/23 1204)                                                                                                                                     Procedures Procedures  (including critical care time)  Medical Decision Making / ED Course    Medical Decision Making:    Amy [name] is a 57 y.o. female  with past medical history as below, significant for DM, anxiety, HTN, fibromyalgia, GERD who presents to the ED with complaint of epig/ruq pain. . The complaint involves an extensive differential diagnosis and also carries with it a high risk of complications and morbidity.  Serious etiology was considered. Ddx includes but is not limited to: Differential diagnosis includes but is not exclusive to acute cholecystitis, intrathoracic causes for epigastric abdominal pain, gastritis, duodenitis, pancreatitis, small bowel or large bowel obstruction, abdominal aortic aneurysm, hernia, gastritis, etc. Differential includes all  life-threatening causes for chest pain. This includes but is not exclusive to acute coronary syndrome, aortic dissection, pulmonary embolism, cardiac tamponade, community-acquired pneumonia, pericarditis, musculoskeletal chest wall pain, etc.   Complete initial physical exam performed, notably the patient  was NAD, sitting upright, RUQ ttp not peritoneal.    Reviewed and confirmed nursing documentation for past medical history, family history, social history.  Vital signs reviewed.    Clinical Course as of 07/16/23 1412  Mon Jul 16, 2023  1407 EKG not crossing over, see media tab. NSR. No stemi [SG]    Clinical Course User Index [SG] Sloan Leiter, DO    Narrative: 57 yo female w/ hx as above here with epig/ruq pain over last 3 days or so Worsened overnight / this am TTP epig/ruq on exam, +murphy Did vomit after GI cocktail, give zofran/ivf Labs stable Get CT imaging  Imaging stable She is feeling much better after intervention Tolerating PO Abd not peritoneal She reports improvement in her pain following PO, consider duodenal ulcer; no melena or brbpr Cont PPI, add carafate, bland diet, f/u GI  The patient improved significantly and was discharged in stable condition. Detailed discussions were had with the patient regarding current findings, and need for close f/u with PCP or on call doctor. The patient has been instructed to return immediately if the symptoms worsen in any way for re-evaluation. Patient verbalized understanding and is in agreement with current care plan. All questions answered prior to discharge.  Additional history obtained: -Additional history obtained from na -External records from outside source obtained and reviewed including: Chart review including previous notes, labs, imaging, consultation notes including home meds, prior labs/imaging    Lab Tests: -I ordered, reviewed, and interpreted labs.   The pertinent results  include:   Labs Reviewed  COMPREHENSIVE METABOLIC PANEL - Abnormal; Notable for the following components:      Result Value   Chloride 96 (*)    Glucose, Bld 140 (*)    AST 45 (*)    All other components within normal limits  CBC - Abnormal; Notable for the following components:   WBC 12.4 (*)    RBC 5.32 (*)    Hemoglobin 15.2 (*)    All other components within normal limits  URINALYSIS, ROUTINE W REFLEX MICROSCOPIC - Abnormal; Notable for the following components:   Color, Urine COLORLESS (*)    Specific Gravity, Urine <1.005 (*)    All other components within normal limits  DIFFERENTIAL - Abnormal; Notable for the following components:   Neutro Abs 8.0 (*)    Abs Immature Granulocytes 0.08 (*)    All other components within normal limits  LIPASE, BLOOD  TROPONIN I (HIGH SENSITIVITY)    Notable for stable labs  EKG   EKG Interpretation Date/Time:    Ventricular Rate:    PR Interval:    QRS Duration:    QT Interval:    QTC Calculation:   R Axis:      Text Interpretation:           Imaging Studies ordered: I ordered imaging studies including CTAP I independently visualized the following imaging with scope of interpretation limited to determining acute life threatening conditions related to emergency care; findings noted above, significant for stable imaging I independently visualized and interpreted imaging. I agree with the radiologist interpretation   Medicines ordered and prescription drug management: Meds ordered this encounter  Medications   sodium chloride 0.9 % bolus 1,000 mL   alum & mag hydroxide-simeth (MAALOX/MYLANTA) 200-200-20 MG/5ML suspension 30 mL   ketorolac (TORADOL) 15 MG/ML injection 15 mg   ondansetron (ZOFRAN) injection 4 mg   iohexol (OMNIPAQUE) 300 MG/ML solution 100 mL   sucralfate (CARAFATE) 1 g tablet    Sig: Take 1 tablet (1 g total) by mouth with breakfast, with lunch, and with evening meal for 7 days.    Dispense:  21 tablet     Refill:  0   ondansetron (ZOFRAN) 4 MG tablet    Sig: Take 1 tablet (4 mg total) by mouth every 4 (four) hours as needed for nausea or vomiting.    Dispense:  6 tablet    Refill:  0    -I have reviewed the patients home medicines and have made adjustments as needed   Consultations Obtained: na   Cardiac Monitoring: The patient was maintained on a cardiac monitor.  I personally viewed and interpreted the cardiac monitored which showed an underlying rhythm of: NSR  Social Determinants of Health:  Diagnosis or treatment significantly limited by social determinants of health: obesity   Reevaluation: After the interventions noted above, I reevaluated the patient and found that they have improved  Co morbidities that complicate the patient evaluation  Past Medical History:  Diagnosis Date   Anemia    Anxiety    xanax prn   Blood transfusion without reported diagnosis    Degenerative disc disease    Diabetes mellitus without complication (HCC)    Fatty  liver    Fibromyalgia    GERD (gastroesophageal reflux disease)    prilosec otc-instructed to take dos   Hot flashes 02/19/2014   Hypertension    controlled on hctz-bp at pat 145/94   Neuromuscular disorder (HCC)    firbrmylagia   Obesity    Panic attacks    PONV (postoperative nausea and vomiting)    pt states has not had ponv but has been treated in past with scopolamine, does admit to history of motion sickness, history of vertigo   Vertigo    recent diagnosis of vertigo-uses otc meclizine   Weight gain 02/19/2014      Dispostion: Disposition decision including need for hospitalization was considered, and patient discharged from emergency department.    Final Clinical Impression(s) / ED Diagnoses Final diagnoses:  Abdominal pain, unspecified abdominal location        Sloan Leiter, DO 07/16/23 1412

## 2023-07-16 NOTE — ED Notes (Signed)
Discharge instructions, follow up care, and prescriptions reviewed and explained, pt verbalized understanding and had no further questions on d/c. Pt caox4, ambulatory, NAD on d/c.  

## 2023-07-18 DIAGNOSIS — N952 Postmenopausal atrophic vaginitis: Secondary | ICD-10-CM | POA: Diagnosis not present

## 2023-07-18 DIAGNOSIS — R61 Generalized hyperhidrosis: Secondary | ICD-10-CM | POA: Diagnosis not present

## 2023-07-18 DIAGNOSIS — R109 Unspecified abdominal pain: Secondary | ICD-10-CM | POA: Diagnosis not present

## 2023-07-24 ENCOUNTER — Telehealth: Payer: Self-pay

## 2023-07-24 NOTE — Telephone Encounter (Signed)
Transition Care Management Unsuccessful Follow-up Telephone Call  Date of discharge and from where:  [facility] 8/12  Attempts:  2nd Attempt  Reason for unsuccessful TCM follow-up call:  No answer/busy   Lenard Forth [facility] Guide, [facility] [phone] [address] [place], [place], [place] [address] Phone: [phone] Email: [email or web address]

## 2023-07-24 NOTE — Telephone Encounter (Signed)
Transition Care Management Unsuccessful Follow-up Telephone Call  Date of discharge and from where: [facility] 8/12  Attempts:  1st Attempt  Reason for unsuccessful TCM follow-up call:  No answer/busy   Lenard Forth [facility] Guide, [facility] [phone] [address] [place], [place], [place] [address] Phone: [phone] Email: [email or web address]

## 2023-08-07 ENCOUNTER — Encounter: Payer: Self-pay | Admitting: Podiatry

## 2023-08-08 ENCOUNTER — Encounter: Payer: Self-pay | Admitting: Cardiovascular Disease

## 2023-08-08 ENCOUNTER — Encounter: Payer: Self-pay | Admitting: Podiatry

## 2023-08-08 ENCOUNTER — Ambulatory Visit: Payer: Medicare HMO | Admitting: Podiatry

## 2023-08-08 ENCOUNTER — Telehealth: Payer: Self-pay | Admitting: Cardiovascular Disease

## 2023-08-08 DIAGNOSIS — M7751 Other enthesopathy of right foot: Secondary | ICD-10-CM | POA: Diagnosis not present

## 2023-08-08 MED ORDER — ALLOPURINOL 100 MG PO TABS: 100.0000 mg | ORAL_TABLET | Freq: Two times a day (BID) | ORAL | 3 refills | Status: DC

## 2023-08-08 MED ORDER — METHYLPREDNISOLONE 4 MG PO TBPK: ORAL_TABLET | ORAL | 0 refills | Status: DC

## 2023-08-08 MED ORDER — TRIAMCINOLONE ACETONIDE 40 MG/ML IJ SUSP
20.0000 mg | Freq: Once | INTRAMUSCULAR | Status: AC
Start: 2023-08-08 — End: 2023-08-08
  Administered 2023-08-08: 20 mg

## 2023-08-08 NOTE — Telephone Encounter (Signed)
Pt c/o medication issue:  1. Name of Medication:   hydrochlorothiazide (MICROZIDE) 12.5 MG capsule    2. How are you currently taking this medication (dosage and times per day)? As written  3. Are you having a reaction (difficulty breathing--STAT)? No   4. What is your medication issue?  Pt called in states her podiatrist wants to discontinue this med because her uric acid is high and she keeps getting gout in her feet and he think this med could be contributing to it. Pt wants to know if this is okay for her to stop. Please advise.   Podiatry notes are in Epic from today.

## 2023-08-08 NOTE — Progress Notes (Signed)
She presents today for follow-up of the gout of the first metatarsophalangeal joint of the right foot.  She states that is still so sore it seems to go from 1 foot to the other foot they really never goes away.  I tried taking the allopurinol but it made you feel little weird so I quit taking it.  She also goes on to say that she was not able to take the methylprednisolone the way that it is prescribed because it makes her too hyper and she yells at her husband.  Objective: Vital signs stable oriented x 3 last uric acid was greater than 10.0 back in June.  She has failed to complete any of the medication prescribed as of yet.  Still has severe pain on palpation with erythema and edema and warmth on palpation of the first metatarsophalangeal joint of the right foot.  Assessment: Capsulitis secondary to gout.  Plan: We are going to try starting her over on methylprednisolone also going to start her back on allopurinol 100 mg twice daily.  If she fails to take his medication due to any adverse symptomatology then we will consider Uloric next visit.  I would like to follow-up with her in 1 month at which time we will have blood work done.  Also it is 1 to see if she is taking her medication as prescribed and will What level her pain is.  We also injected around the joint today with 20 mg Kenalog 5 mg of Marcaine to the point of maximal tenderness.  Will follow-up with her in 1

## 2023-08-08 NOTE — Telephone Encounter (Signed)
Unfortunately, diuretics, especially hydrochlorothiazide can worsen gout events. Go ahead and stop it. It will take a couple of weeks for the full effect on your BP to dissipate. Please check BP once daily starting next week, for a week, and send me a log through mychart. Time to check fasting lipids now, whenever convenient.

## 2023-08-08 NOTE — Telephone Encounter (Signed)
Patient states podiatrist wants her to stop hydrochlorothiazide because her uric acid is high and she keep getting gout in her feet. She states she would like to know how you feel about her stopping hydrochlorothiazide

## 2023-08-09 DIAGNOSIS — E782 Mixed hyperlipidemia: Secondary | ICD-10-CM | POA: Diagnosis not present

## 2023-08-10 ENCOUNTER — Encounter: Payer: Self-pay | Admitting: Cardiovascular Disease

## 2023-08-14 ENCOUNTER — Other Ambulatory Visit (HOSPITAL_BASED_OUTPATIENT_CLINIC_OR_DEPARTMENT_OTHER): Payer: Self-pay

## 2023-08-21 ENCOUNTER — Other Ambulatory Visit: Payer: Self-pay | Admitting: Cardiovascular Disease

## 2023-08-23 ENCOUNTER — Encounter: Payer: Self-pay | Admitting: Cardiovascular Disease

## 2023-09-05 ENCOUNTER — Ambulatory Visit: Payer: Medicare HMO | Admitting: Podiatry

## 2023-09-10 ENCOUNTER — Encounter: Payer: Self-pay | Admitting: Psychiatry

## 2023-09-10 ENCOUNTER — Ambulatory Visit: Payer: Self-pay | Admitting: Psychiatry

## 2023-09-10 DIAGNOSIS — F411 Generalized anxiety disorder: Secondary | ICD-10-CM | POA: Diagnosis not present

## 2023-09-10 DIAGNOSIS — F4310 Post-traumatic stress disorder, unspecified: Secondary | ICD-10-CM

## 2023-09-10 DIAGNOSIS — F4010 Social phobia, unspecified: Secondary | ICD-10-CM | POA: Diagnosis not present

## 2023-09-10 DIAGNOSIS — F39 Unspecified mood [affective] disorder: Secondary | ICD-10-CM

## 2023-09-10 DIAGNOSIS — H1045 Other chronic allergic conjunctivitis: Secondary | ICD-10-CM | POA: Diagnosis not present

## 2023-09-10 MED ORDER — LURASIDONE HCL 40 MG PO TABS: ORAL_TABLET | ORAL | 1 refills | Status: DC

## 2023-09-10 NOTE — Progress Notes (Signed)
Amy [name] [id number] March 07, 1966 57 y.o.  Subjective:   Patient ID:  Amy [name] is a 57 y.o. (DOB 1966-09-01) female.  Chief Complaint:  Chief Complaint  Patient presents with   Anxiety   Depression   Sleeping Problem   Other    Irritability    HPI Amy [name] presents to the office today for follow-up of mood disturbance,anxiety, and sleep disturbance. She reports after starting Wellbutrin XL she "thought it was going good" and then noticed loose stools and stomach issues. She stopped Wellbutrin XL and GI issues resolved.   She continues to take Lexapro and "I have my good days and bad days."  She reports that she did not sleep well and has had trouble sleeping. She reports that she went 24 hours without sleep a few times in recent weeks.  She reports that in January she traded her 2020 vehicle for a brand new vehicle, kept it for a week, then traded it for another brand new vehicle. She reports that within a few months she traded vehicles multiple times. She sold one of her vehicles and now is without a vehicle. She reports that she has not understood why she is trading these vehicles and why she was not content with some of these vehicles. She reports that she will make purchases impulsively on her phone in the middle of the night. She committed to a bake sale at [employer] but does not want to go. She reports that she has brief periods where she is more talkative and talking faster.   She reports increased irritability. She reports anger. Denies elevated moods. She reports periods of depression. Mood has been more "down." Energy and motivation have been low. She reports that she has not washed her hair on over a week. Rarely has bursts of energy. She reports difficulty with concentration and has difficulty staying focused. She reports that at times "my mind will go blank." She reports "brain fog." She reports that appetite was less on Wellbutrin XL. She reports, "I've made  comments that 'maybe it would be better if I wasn't here.' " Reports some excessive feelings of guilt. She denies SI.   She reports that she has avoided church because of crowds. She reports that she went ot church recently for the first time in about a year and she had profuse sweating. She reports that she has seen medical providers and told that she is not peri-menopausal. She reports that excessive sweating seems to occur mostly when she leaves home or has to go in somewhere. She reports that her heart races when she is out in public. "I've had some pretty bad panic attacks." Cancelling appointments at times due to anxiety. She reports anxiety in response to negativity/conflict. She reports intrusive memories and flashbacks. She reports certain triggers to memories, such as anniversaries of certain dates.   She reports chronic paranoia- "I'm always like that." She reports feeling like no one likes. She reports feeling like she is being watched at times. Reports occasionally feeling that people are out to get her. Denies VH.  Husband has been having health issues. Husband has been trying to find a psychiatric provider.   She reports that she has to take Xanax 1 mg "before going anywhere."   Past Medication Trials: Abilify- Irritable, restless, nausea Vraylar- akathisia Latuda- Effective, then stopped working Risperidone- "felt weird."  Seroquel Saphris Caplyta Lamotrigine  Carbamazepine- SJS Topamax-Sleeplessness, irritability Prozac- Somewhat helpful for mood and anxiety.  Wellbutrin XL- Reports  stomach upset Trileptal  Sertraline- Limited improvement in mood and anxiety s/s.  Lexapro Propranolol- effective for akathisia. May have been helpful for anxiety. Buspar- "did not like the way it made me feel"  Xanax Gabapentin- Adverse effects, drowsiness  PHQ2-9    Flowsheet Row Office Visit from 11/20/2018 in [facility]  PHQ-2 Total Score 0      Flowsheet Row ED from  07/16/2023 in [facility] at [facility] from 07/29/2022 in [facility] Department at [facility]  C-SSRS RISK CATEGORY No Risk No Risk        Review of Systems:  Review of Systems  Constitutional:  Positive for diaphoresis.  Eyes:  Positive for redness and itching.  Endocrine:       She reports adequate glycemic control.  Musculoskeletal:  Negative for gait problem.  Neurological:  Negative for tremors.       She reports restlessness while driving  Psychiatric/Behavioral:         Please refer to HPI    Medications: I have reviewed the patient's current medications.  Current Outpatient Medications  Medication Sig Dispense Refill   ALPRAZolam (XANAX) 0.5 MG tablet Take 0.5 mg by mouth 4 (four) times daily as needed for anxiety.      carvedilol (COREG) 6.25 MG tablet TAKE 1 TABLET BY MOUTH TWICE A DAY 180 tablet 3   escitalopram (LEXAPRO) 20 MG tablet Take 20 mg by mouth daily.     lansoprazole (PREVACID) 15 MG capsule Take 15 mg by mouth daily in the afternoon.     loratadine (CLARITIN) 10 MG tablet Take 10 mg by mouth daily.     metFORMIN (GLUCOPHAGE) 1000 MG tablet Take 1,000 mg by mouth 2 (two) times daily.     allopurinol (ZYLOPRIM) 100 MG tablet Take 1 tablet (100 mg total) by mouth 2 (two) times daily. (Patient not taking: Reported on 09/10/2023) 60 tablet 3   ezetimibe (ZETIA) 10 MG tablet Take 1 tablet (10 mg total) by mouth daily. (Patient not taking: Reported on 04/16/2023) 90 tablet 3   hydrochlorothiazide (MICROZIDE) 12.5 MG capsule TAKE 2 CAPSULES BY MOUTH EVERY DAY (Patient not taking: Reported on 09/10/2023) 180 capsule 1   hydrocortisone 2.5 % ointment Apply topically as needed. (Patient not taking: Reported on 02/08/2023)     lurasidone (LATUDA) 40 MG TABS tablet Take 1/2 tablet daily with supper for one week, then increase to 1 tablet daily with supper 30 tablet 1   ondansetron (ZOFRAN) 4 MG tablet Take 1 tablet (4 mg total)  by mouth every 4 (four) hours as needed for nausea or vomiting. (Patient not taking: Reported on 09/10/2023) 6 tablet 0   OneTouch Delica Lancets 30G MISC Inject into the skin daily in the afternoon.     ONETOUCH VERIO test strip 1 each by Other route daily in the afternoon.     pravastatin (PRAVACHOL) 40 MG tablet Take 1 tablet (40 mg total) by mouth every evening. (Patient not taking: Reported on 04/16/2023) 90 tablet 3   No current facility-administered medications for this visit.    Medication Side Effects: None  Allergies:  Allergies  Allergen Reactions   Carbamazepine    Hydromorphone Nausea And Vomiting   Metformin Hives    Able to tolerate Metformin XR   Ciprofloxacin Rash    Past Medical History:  Diagnosis Date   Anemia    Anxiety    xanax prn   Blood transfusion without reported diagnosis  Degenerative disc disease    Diabetes mellitus without complication (HCC)    Fatty liver    Fibromyalgia    GERD (gastroesophageal reflux disease)    prilosec otc-instructed to take dos   Gout    Hot flashes 02/19/2014   Hypertension    controlled on hctz-bp at pat 145/94   Neuromuscular disorder (HCC)    firbrmylagia   Obesity    Panic attacks    PONV (postoperative nausea and vomiting)    pt states has not had ponv but has been treated in past with scopolamine, does admit to history of motion sickness, history of vertigo   Vertigo    recent diagnosis of vertigo-uses otc meclizine   Weight gain 02/19/2014    Past Medical History, Surgical history, Social history, and Family history were reviewed and updated as appropriate.   Please see review of systems for further details on the patient's review from today.   Objective:   Physical Exam:  LMP 05/01/2011   Physical Exam Constitutional:      General: She is not in acute distress. Musculoskeletal:        General: No deformity.  Neurological:     Mental Status: She is alert and oriented to person, place, and  time.     Coordination: Coordination normal.  Psychiatric:        Attention and Perception: Attention and perception normal. She does not perceive auditory or visual hallucinations.        Mood and Affect: Mood is anxious and depressed. Affect is tearful. Affect is not labile, blunt, angry or inappropriate.        Speech: Speech normal.        Behavior: Behavior is slowed. Behavior is cooperative.        Thought Content: Thought content is paranoid. Thought content is not delusional. Thought content does not include homicidal or suicidal ideation. Thought content does not include homicidal or suicidal plan.        Cognition and Memory: Cognition and memory normal.        Judgment: Judgment is impulsive.     Comments: Insight fair     Lab Review:     Component Value Date/Time   NA 139 08/09/2023 0959   K 3.9 08/09/2023 0959   CL 98 08/09/2023 0959   CO2 28 08/09/2023 0959   GLUCOSE 150 (H) 08/09/2023 0959   GLUCOSE 140 (H) 07/16/2023 1023   BUN 9 08/09/2023 0959   CREATININE 0.72 08/09/2023 0959   CREATININE 0.69 02/19/2014 1542   CALCIUM 9.6 08/09/2023 0959   PROT 6.5 08/09/2023 0959   ALBUMIN 4.1 08/09/2023 0959   AST 55 (H) 08/09/2023 0959   ALT 39 (H) 08/09/2023 0959   ALKPHOS 106 08/09/2023 0959   BILITOT 0.5 08/09/2023 0959   GFRNONAA >60 07/16/2023 1023   GFRAA 72 01/20/2021 1132       Component Value Date/Time   WBC 12.4 (H) 07/16/2023 1023   RBC 5.32 (H) 07/16/2023 1023   HGB 15.2 (H) 07/16/2023 1023   HGB 15.4 05/24/2023 1336   HCT 45.6 07/16/2023 1023   HCT 46.0 05/24/2023 1336   PLT 257 07/16/2023 1023   PLT 287 05/24/2023 1336   MCV 85.7 07/16/2023 1023   MCV 86 05/24/2023 1336   MCH 28.6 07/16/2023 1023   MCHC 33.3 07/16/2023 1023   RDW 14.1 07/16/2023 1023   RDW 14.0 05/24/2023 1336   LYMPHSABS 3.3 07/16/2023 1023   LYMPHSABS 2.9 05/24/2023 1336  MONOABS 0.7 07/16/2023 1023   EOSABS 0.2 07/16/2023 1023   EOSABS 0.1 05/24/2023 1336   BASOSABS  0.1 07/16/2023 1023   BASOSABS 0.1 05/24/2023 1336    No results found for: "POCLITH", "LITHIUM"   No results found for: "PHENYTOIN", "PHENOBARB", "VALPROATE", "CBMZ"   .res Assessment: Plan:    45 minutes spent dedicated to the care of this patient on the date of this encounter to include pre-visit review of records, ordering of medication, post visit documentation, and face-to-face time with the patient discussing recent behaviors, mood, and anxiety symptoms since last visit, in addition to her reporting impulsivity since earlier this year and buying and trading multiple new vehicles. Reviewed potential benefits, risks, and side effects of Latuda with patient. Discussed that in the past Latuda has been the most helpful medication for her symptoms and would target irritability, depression, impulsivity, and paranoia. Discussed that Kasandra Knudsen is not indicated for insomnia and anxiety, however it may be helpful for these symptoms as well. Discussed that she has not been above doses of Latuda 40 mg in the past and higher doses may be needed to adequately control symptoms. Discussed starting with lower dose and increasing gradually to ensure tolerability. Will starting Latuda 20 mg daily with supper for one week, then increase to 40 mg daily.  Pt reports that she is currently on a wait list to see a therapist. Agree that therapy would likely be beneficial.  Pt to follow-up with this provider in 4-6 weeks or sooner if clinically indicated.  Patient advised to contact office with any questions, adverse effects, or acute worsening in signs and symptoms.   Shelia was seen today for anxiety, depression, sleeping problem and other.  Diagnoses and all orders for this visit:  Episodic mood disorder (HCC) -     lurasidone (LATUDA) 40 MG TABS tablet; Take 1/2 tablet daily with supper for one week, then increase to 1 tablet daily with supper  PTSD (post-traumatic stress disorder)  Generalized anxiety  disorder  Social anxiety disorder     Please see After Visit Summary for patient specific instructions.  Future Appointments  Date Time Provider Department Center  10/08/2023  9:30 AM Corie Chiquito, PMHNP [facility] None  10/12/2023  9:50 AM Pyrtle, Carie Caddy, MD [facility]    No orders of the defined types were placed in this encounter.   -------------------------------

## 2023-09-14 ENCOUNTER — Encounter: Payer: Self-pay | Admitting: Professional Counselor

## 2023-09-14 ENCOUNTER — Ambulatory Visit (INDEPENDENT_AMBULATORY_CARE_PROVIDER_SITE_OTHER): Payer: Medicare HMO | Admitting: Professional Counselor

## 2023-09-14 DIAGNOSIS — F39 Unspecified mood [affective] disorder: Secondary | ICD-10-CM | POA: Diagnosis not present

## 2023-09-14 DIAGNOSIS — F411 Generalized anxiety disorder: Secondary | ICD-10-CM
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
## 2023-09-18 ENCOUNTER — Other Ambulatory Visit: Payer: Self-pay | Admitting: Psychiatry

## 2023-09-18 DIAGNOSIS — F39 Unspecified mood [affective] disorder: Secondary | ICD-10-CM

## 2023-09-25 ENCOUNTER — Telehealth: Payer: Self-pay | Admitting: Psychiatry

## 2023-09-25 NOTE — Telephone Encounter (Signed)
Does not require a PA, cost thru insurance is $85.66. 10/29 appt is with Betsey. Has appt with Shanda Bumps 11/4.

## 2023-09-25 NOTE — Telephone Encounter (Signed)
Makinzy called at 3:31 to report that the pharmacy didn't get the Latuda until last week and then the cost is $85/month.  She can't afford that.  She asked if there are coupons but with it being generic coupons are no longer available to my knowledge.  Does it need a PA.  Anyway, she said if the cost can't be lowered she can't afford it and will need something else.  Can discuss at appt 10/29

## 2023-10-01 ENCOUNTER — Encounter: Payer: Self-pay | Admitting: Cardiovascular Disease

## 2023-10-01 NOTE — Telephone Encounter (Signed)
Please tell her that unfortunately all the commonly used diuretics increase the risk of having a gout attack so it is preferable that she does not take any diuretics. Almost all those blood pressure readings are normal range, so does not look like she needs a diuretic for that either. Avoiding sodium rich foods is a good way to help prevent the swelling she is describing, better than taking another pill.

## 2023-10-02 ENCOUNTER — Ambulatory Visit: Payer: Medicare HMO | Admitting: Professional Counselor

## 2023-10-02 ENCOUNTER — Encounter: Payer: Self-pay | Admitting: Professional Counselor

## 2023-10-02 DIAGNOSIS — F411 Generalized anxiety disorder: Secondary | ICD-10-CM | POA: Diagnosis not present

## 2023-10-02 DIAGNOSIS — F39 Unspecified mood [affective] disorder: Secondary | ICD-10-CM | POA: Diagnosis not present
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
## 2023-10-04 DIAGNOSIS — E1165 Type 2 diabetes mellitus with hyperglycemia: Secondary | ICD-10-CM | POA: Diagnosis not present

## 2023-10-04 DIAGNOSIS — I10 Essential (primary) hypertension: Secondary | ICD-10-CM | POA: Diagnosis not present

## 2023-10-04 DIAGNOSIS — E785 Hyperlipidemia, unspecified: Secondary | ICD-10-CM | POA: Diagnosis not present

## 2023-10-08 ENCOUNTER — Ambulatory Visit: Payer: Medicare HMO | Admitting: Psychiatry

## 2023-10-12 ENCOUNTER — Ambulatory Visit: Payer: Medicare HMO | Admitting: Internal Medicine

## 2023-10-16 ENCOUNTER — Ambulatory Visit: Payer: Medicare HMO | Admitting: Professional Counselor

## 2023-10-17 ENCOUNTER — Encounter: Payer: Self-pay | Admitting: Psychiatry

## 2023-10-18 DIAGNOSIS — Z1212 Encounter for screening for malignant neoplasm of rectum: Secondary | ICD-10-CM | POA: Diagnosis not present

## 2023-10-18 DIAGNOSIS — Z1211 Encounter for screening for malignant neoplasm of colon: Secondary | ICD-10-CM | POA: Diagnosis not present

## 2023-10-24 ENCOUNTER — Ambulatory Visit: Payer: Medicare HMO | Admitting: Psychiatry

## 2023-11-03 ENCOUNTER — Other Ambulatory Visit: Payer: Self-pay | Admitting: Podiatry

## 2023-11-14 ENCOUNTER — Ambulatory Visit: Payer: Medicare HMO | Admitting: Professional Counselor

## 2023-11-26 ENCOUNTER — Ambulatory Visit: Payer: Medicare HMO | Admitting: Professional Counselor

## 2023-12-04 DIAGNOSIS — E119 Type 2 diabetes mellitus without complications: Secondary | ICD-10-CM | POA: Diagnosis not present

## 2023-12-04 DIAGNOSIS — H0259 Other disorders affecting eyelid function: Secondary | ICD-10-CM | POA: Diagnosis not present

## 2023-12-04 DIAGNOSIS — M3501 Sicca syndrome with keratoconjunctivitis: Secondary | ICD-10-CM | POA: Diagnosis not present

## 2023-12-04 DIAGNOSIS — H2513 Age-related nuclear cataract, bilateral: Secondary | ICD-10-CM | POA: Diagnosis not present

## 2023-12-14 ENCOUNTER — Ambulatory Visit: Payer: Medicare HMO | Admitting: Professional Counselor

## 2023-12-19 DIAGNOSIS — E785 Hyperlipidemia, unspecified: Secondary | ICD-10-CM | POA: Diagnosis not present

## 2023-12-19 DIAGNOSIS — E1165 Type 2 diabetes mellitus with hyperglycemia: Secondary | ICD-10-CM | POA: Diagnosis not present

## 2023-12-19 DIAGNOSIS — I10 Essential (primary) hypertension: Secondary | ICD-10-CM | POA: Diagnosis not present

## 2024-01-09 DIAGNOSIS — H2513 Age-related nuclear cataract, bilateral: Secondary | ICD-10-CM | POA: Diagnosis not present

## 2024-01-09 DIAGNOSIS — M3501 Sicca syndrome with keratoconjunctivitis: Secondary | ICD-10-CM | POA: Diagnosis not present

## 2024-01-09 DIAGNOSIS — D3131 Benign neoplasm of right choroid: Secondary | ICD-10-CM | POA: Diagnosis not present

## 2024-01-09 DIAGNOSIS — E119 Type 2 diabetes mellitus without complications: Secondary | ICD-10-CM | POA: Diagnosis not present

## 2024-01-13 ENCOUNTER — Encounter: Payer: Self-pay | Admitting: Cardiovascular Disease

## 2024-01-16 ENCOUNTER — Other Ambulatory Visit: Payer: Self-pay | Admitting: Internal Medicine

## 2024-01-16 DIAGNOSIS — K7581 Nonalcoholic steatohepatitis (NASH): Secondary | ICD-10-CM | POA: Diagnosis not present

## 2024-01-16 DIAGNOSIS — Z1231 Encounter for screening mammogram for malignant neoplasm of breast: Secondary | ICD-10-CM

## 2024-01-16 DIAGNOSIS — Z0000 Encounter for general adult medical examination without abnormal findings: Secondary | ICD-10-CM | POA: Diagnosis not present

## 2024-01-16 DIAGNOSIS — I10 Essential (primary) hypertension: Secondary | ICD-10-CM | POA: Diagnosis not present

## 2024-01-16 DIAGNOSIS — F4310 Post-traumatic stress disorder, unspecified: Secondary | ICD-10-CM | POA: Diagnosis not present

## 2024-01-16 DIAGNOSIS — Z79899 Other long term (current) drug therapy: Secondary | ICD-10-CM | POA: Diagnosis not present

## 2024-01-16 DIAGNOSIS — E118 Type 2 diabetes mellitus with unspecified complications: Secondary | ICD-10-CM | POA: Diagnosis not present

## 2024-01-16 DIAGNOSIS — G4733 Obstructive sleep apnea (adult) (pediatric): Secondary | ICD-10-CM | POA: Diagnosis not present

## 2024-01-16 DIAGNOSIS — F411 Generalized anxiety disorder: Secondary | ICD-10-CM | POA: Diagnosis not present

## 2024-01-16 DIAGNOSIS — E782 Mixed hyperlipidemia: Secondary | ICD-10-CM | POA: Diagnosis not present

## 2024-01-17 ENCOUNTER — Inpatient Hospital Stay
Admission: RE | Admit: 2024-01-17 | Discharge: 2024-01-17 | Disposition: A | Discharge status: Home / Self Care | Payer: Self-pay | Source: Ambulatory Visit | Attending: Internal Medicine | Admitting: Internal Medicine

## 2024-01-17 ENCOUNTER — Other Ambulatory Visit: Payer: Self-pay | Admitting: *Deleted

## 2024-01-17 DIAGNOSIS — Z1231 Encounter for screening mammogram for malignant neoplasm of breast: Secondary | ICD-10-CM

## 2024-01-18 ENCOUNTER — Inpatient Hospital Stay: Admission: RE | Admit: 2024-01-18 | Payer: Medicare HMO | Source: Ambulatory Visit

## 2024-02-13 ENCOUNTER — Other Ambulatory Visit: Payer: Self-pay | Admitting: Cardiovascular Disease

## 2024-02-20 ENCOUNTER — Ambulatory Visit
Admission: RE | Admit: 2024-02-20 | Discharge: 2024-02-20 | Disposition: A | Discharge status: Home / Self Care | Payer: Self-pay | Source: Ambulatory Visit | Attending: Internal Medicine | Admitting: Internal Medicine

## 2024-02-20 DIAGNOSIS — Z1231 Encounter for screening mammogram for malignant neoplasm of breast: Secondary | ICD-10-CM | POA: Diagnosis not present

## 2024-02-26 ENCOUNTER — Ambulatory Visit: Payer: Medicare HMO | Attending: Cardiovascular Disease | Admitting: Cardiovascular Disease

## 2024-02-26 ENCOUNTER — Encounter: Payer: Self-pay | Admitting: Cardiovascular Disease

## 2024-02-26 VITALS — BP 142/100 | HR 78 | Ht 64.0 in | Wt 250.0 lb

## 2024-02-26 DIAGNOSIS — E782 Mixed hyperlipidemia: Secondary | ICD-10-CM

## 2024-02-26 DIAGNOSIS — E1169 Type 2 diabetes mellitus with other specified complication: Secondary | ICD-10-CM

## 2024-02-26 DIAGNOSIS — I10 Essential (primary) hypertension: Secondary | ICD-10-CM

## 2024-02-26 DIAGNOSIS — E669 Obesity, unspecified: Secondary | ICD-10-CM | POA: Diagnosis not present

## 2024-02-26 DIAGNOSIS — K7581 Nonalcoholic steatohepatitis (NASH): Secondary | ICD-10-CM

## 2024-02-26 MED ORDER — AMLODIPINE BESYLATE 2.5 MG PO TABS: 2.5000 mg | ORAL_TABLET | Freq: Every day | ORAL | 3 refills | Status: DC

## 2024-02-26 MED ORDER — CARVEDILOL 12.5 MG PO TABS: 12.5000 mg | ORAL_TABLET | Freq: Two times a day (BID) | ORAL | 3 refills | Status: AC

## 2024-02-26 NOTE — Progress Notes (Signed)
 Cardiology Office Note:    Date:  02/26/2024   ID:  Amy [name], DOB [birth year], MRN [id number]  PCP:  Marguarite Arbour, MD  Cardiologist:  Thurmon Fair, MD  Electrophysiologist:  None   Referring MD: Elfredia Nevins, MD   Chief Complaint  Patient presents with   Hypertension     History of Present Illness:    Amy [name] is a 58 y.o. female with a hx of severe obesity, HTN, mixed hyperlipidemia, type 2 diabetes mellitus, fatty liver, gastroesophageal reflux disease.  Continues to be frustrated with her weight.  She did not tolerate Ozempic.  She has not started treatment with [facility] but is still on the starting dose.  She feels unwell the first day after taking it, little loopy and queasy and with a slight headache.  She has not had chest pain or shortness of breath but remains quite sedentary.  She denies palpitations, syncope, lower extremity edema.  She has not had orthopnea or PND.  Given several changes in her medications and she is not entirely sure why some of them have occurred.  She is no longer taking hydrochlorothiazide due to her history of gout (on the other hand she has not had a recent gout attack and her allopurinol has also been discontinued).  She is no longer taking ezetimibe or pravastatin although she does not recall having any side effects from them.  We checked her lipid profile last September she had an excellent LDL at 63 and HDL of 44 and triglycerides were closer to normal range at 180.  She had repeat labs this February and was no longer taking the statin and probably also had stopped the Zetia.  Her LDL was up to 131 and triglycerides were up to 250, HDL 42.  Her primary care provider subsequently started her on rosuvastatin 5 mg daily.  She has not had repeat labs since.  Glucose control was fair with a hemoglobin A1c of 7.0% last October, but increased to 7.4% in February of this year.  She has normal renal function with creatinine 0.72 and  potassium is normal at 3.9.  In the past she has had hyperkalemia when receiving RAAS inhibitors.  Coronary calcium score was 0 in March 2024.  CT angiography August 2020 for chest pain did not show pulmonary embolism and was also significant for the complete absence of atherosclerotic calcification in her coronary arteries or thoracic aorta.  Coronary calcium score in March 2024 was 0.   ALT and AST have often be elevated < 2xULN since 2014 when she had an abdominal ultrasound that showed fatty liver, with but labs and February 2025 showed normal ALT and normal AST with a marginally elevated alkaline phosphatase.. No evidence of renal artery stenosis on Duplex US Feb 2022.  Coronary calcium score in 2024 showed a score of 0.  She still has flashbacks and nightmares about her ectopic pregnancy and is very despondent about her inability to have children.  She thinks she truly has posttraumatic stress disorder.  She is also under emotional strain caring for elderly family members with health problems and dealing with financial issues, as her husband is currently out of work.  Attempts to stop her diuretic therapy were associated with worsening edema.  Higher doses of diuretic on 1 occasion led to acute kidney injury with an elevation in creatinine to 2.5.  She did not tolerate amlodipine due to ankle swelling and flushing.  She does take carvedilol twice daily.  She  probably had an episode of beta-blocker rebound when she first started Ozempic since she was throwing up her medicines.   Past Medical History:  Diagnosis Date   Anemia    Anxiety    xanax prn   Blood transfusion without reported diagnosis    Degenerative disc disease    Diabetes mellitus without complication (HCC)    Fatty liver    Fibromyalgia    GERD (gastroesophageal reflux disease)    prilosec otc-instructed to take dos   Gout    Hot flashes 02/19/2014   Hypertension    controlled on hctz-bp at pat 145/94   Neuromuscular  disorder (HCC)    firbrmylagia   Obesity    Panic attacks    PONV (postoperative nausea and vomiting)    pt states has not had ponv but has been treated in past with scopolamine, does admit to history of motion sickness, history of vertigo   Vertigo    recent diagnosis of vertigo-uses otc meclizine   Weight gain 02/19/2014    Past Surgical History:  Procedure Laterality Date   ABDOMINAL HYSTERECTOMY     BREAST REDUCTION SURGERY     2010   ECTOPIC PREGNANCY SURGERY     2002   LAPAROSCOPIC ASSISTED VAGINAL HYSTERECTOMY  06/14/2011   Procedure: LAPAROSCOPIC ASSISTED VAGINAL HYSTERECTOMY;  Surgeon: Turner Daniels, MD;  Location: [facility];  Service: Gynecology;  Laterality: N/A;  Abdomen and vagina   LAPAROSCOPY     2006-for adhesions   REDUCTION MAMMAPLASTY Bilateral    2009    Current Medications: Current Meds  Medication Sig   ALPRAZolam (XANAX) 0.5 MG tablet Take 0.5 mg by mouth 4 (four) times daily as needed for anxiety.    amLODipine (NORVASC) 2.5 MG tablet Take 1 tablet (2.5 mg total) by mouth daily.   carvedilol (COREG) 12.5 MG tablet Take 1 tablet (12.5 mg total) by mouth 2 (two) times daily.   escitalopram (LEXAPRO) 20 MG tablet Take 20 mg by mouth daily.   lansoprazole (PREVACID) 15 MG capsule Take 15 mg by mouth daily in the afternoon.   loratadine (CLARITIN) 10 MG tablet Take 10 mg by mouth daily.   metFORMIN (GLUCOPHAGE) 1000 MG tablet Take 1,000 mg by mouth 2 (two) times daily.   MOUNJARO 5 MG/0.5ML Pen Inject 5 mg into the skin once a week.   OneTouch Delica Lancets 30G MISC Inject into the skin daily in the afternoon.   ONETOUCH VERIO test strip 1 each by Other route daily in the afternoon.   rosuvastatin (CRESTOR) 5 MG tablet Take 5 mg by mouth daily.   [DISCONTINUED] carvedilol (COREG) 6.25 MG tablet TAKE 1 TABLET BY MOUTH TWICE A DAY     Allergies:   Carbamazepine, Metformin, Ciprofloxacin, and Hydromorphone   Family History: The patient's family history  includes Anxiety disorder in her mother, paternal grandmother, and paternal uncle; Arthritis in her mother, sister, and sister; Cancer in her maternal grandmother and paternal uncle; Depression in her mother; Diabetes in her father, maternal grandfather, paternal grandfather, and paternal grandmother; Hypertension in her father, maternal grandfather, paternal grandfather, and paternal grandmother; Mood Disorder in her brother and maternal uncle; Panic disorder in her sister; Psychosis in her maternal uncle.  ROS:   Please see the history of present illness.     All other systems reviewed and are negative.  EKGs/Labs/Other Studies Reviewed:    The following studies were reviewed today: Renal duplex ultrasound 01/21/2021    Right: Normal size right kidney.  Normal right Resistive Index.         Normal cortical thickness of right kidney. No evidence of         right renal artery stenosis. RRV flow present.  Left:  Normal size of left kidney. Normal left Resistive Index.         Normal cortical thickness of the left kidney. 1-59% stenosis         of the left renal artery. LRV flow present. There is a kink         in the vessel at the origin/proximal segment that is likely         causing mildly elevated velocities.  Mesenteric:  Normal Celiac artery and Superior Mesenteric artery findings.  Patent IVC.   EKG:    EKG Interpretation Date/Time:  Tuesday February 26 2024 08:35:07 EDT Ventricular Rate:  78 PR Interval:  124 QRS Duration:  90 QT Interval:  388 QTC Calculation: 442 R Axis:   -4  Text Interpretation: Normal sinus rhythm Cannot rule out Anterior infarct (cited on or before 23-May-2020) When compared with ECG of 29-Jul-2022 17:00, Questionable change in QRS axis Confirmed by Dearies Meikle ([id number]) on 02/26/2024 8:45:52 AM         Recent Labs: 07/16/2023: Hemoglobin 15.2; Platelets 257 08/09/2023: ALT 39; BUN 9; Creatinine, Ser 0.72; Potassium 3.9; Sodium 139  06/08/2021  hemoglobin A1c 7.1% 02/08/2023  glucose 128, BUN 9, creatinine 0.71, potassium 3.6, AST 59, ALT 50, TSH 2.05, hemoglobin 14.8 01/16/2024 Glucose 91, potassium 4.5, creatinine 0.7, AST 38, ALT 32, alkaline phosphatase 106, hemoglobin 15.5, TSH 3.43 Hemoglobin A1c 7.4%  Recent Lipid Panel  11/29/2020 Cholesterol 202, HDL 40, LDL 120, triglycerides 241  02/08/2023  cholesterol 214, HDL 37, LDL 137, triglycerides 216      Component Value Date/Time   CHOL 137 08/09/2023 0959   TRIG 180 (H) 08/09/2023 0959   HDL 44 08/09/2023 0959   CHOLHDL 3.1 08/09/2023 0959   LDLCALC 63 08/09/2023 0959    01/16/2024 Total cholesterol 223, HDL 41.6, LDL 131, triglycerides 250   Physical Exam:    VS:  BP (!) 142/100 (BP Location: Left Wrist, Patient Position: Standing, Cuff Size: Large)   Pulse 78   Ht 5\' 4"  (1.626 m)   Wt 250 lb (113.4 kg)   LMP 05/01/2011   SpO2 96%   BMI 42.91 kg/m     Wt Readings from Last 3 Encounters:  02/26/24 250 lb (113.4 kg)  07/16/23 240 lb (108.9 kg)  02/08/23 246 lb 12.8 oz (111.9 kg)      General: Alert, oriented x3, no distress, morbidly obese Head: no evidence of trauma, PERRL, EOMI, no exophtalmos or lid lag, no myxedema, no xanthelasma; normal ears, nose and oropharynx Neck: normal jugular venous pulsations and no hepatojugular reflux; brisk carotid pulses without delay and no carotid bruits Chest: clear to auscultation, no signs of consolidation by percussion or palpation, normal fremitus, symmetrical and full respiratory excursions Cardiovascular: normal position and quality of the apical impulse, regular rhythm, normal first and second heart sounds, no murmurs, rubs or gallops Abdomen: no tenderness or distention, no masses by palpation, no abnormal pulsatility or arterial bruits, normal bowel sounds, no hepatosplenomegaly Extremities: no clubbing, cyanosis or edema; 2+ radial, ulnar and brachial pulses bilaterally; 2+ right femoral, posterior  tibial and dorsalis pedis pulses; 2+ left femoral, posterior tibial and dorsalis pedis pulses; no subclavian or femoral bruits Neurological: grossly nonfocal Psych: Normal mood and affect  ASSESSMENT:    1. Essential hypertension   2. Type 2 diabetes mellitus with obesity (HCC)   3. Mixed hyperlipidemia   4. NASH (nonalcoholic steatohepatitis)   5. Morbid obesity (HCC)      PLAN:    In order of problems listed above:   HTN: Not well-controlled.  Hydrochlorothiazide has been stopped due to concerns about gout, but she has also stopped her allopurinol.  She developed hyperkalemia while taking RAAS inhibitors in the past.  She had ankle swelling with higher doses of amlodipine.  Will increase the carvedilol, which she seems to be tolerating well, to 12.5 mg twice daily.  Asked her to send me a blood pressure reading at the end of this week.  I suspect her diastolic blood pressure will still be too high.  So I also gave her prescription for lowest dose amlodipine 2.5 mg to take once daily.  Will reevaluate in a few weeks in clinic.  If she develops ankle swelling or her blood pressure is still poorly controlled, I would like to restart a very low-dose of diuretic, may be prescribed every other day. DM: Control has waxed and waned and most recently her hemoglobin A1c was too high at 7.4%. HLP: Lipid profile is typical of metabolic syndrome/insulin resistance.  Fortunately calcium score is 0.  She had an excellent lipid profile in September combination of pravastatin and ezetimibe which brought her LDL down to 63 and even showed improvement in her triglycerides and HDL.  I am not sure why she stopped those medications but her lipids predictably return back to their baseline with an LDL of 131 and triglycerides 250.  She has been prescribed rosuvastatin.  Will wait until she has a repeat lipid profile before making any other adjustments. NASH: Very likely obesity related fatty liver.   Transaminases are actually better on the labs from February with only the alk phos being slightly out of range.  No evidence of parenchymal liver insufficiency or structural changes other than fatty liver.  She does not drink alcohol.  Weight loss is the major way to deal with this. Should even consider bariatric surgery, if conservative efforts at weight loss are not successful Morbid obesity: Did not tolerate Ozempic due to side effects and is now trying Mounjaro.  Should even consider bariatric surgery, if conservative efforts at weight loss are not successful.      Medication Adjustments/Labs and Tests Ordered: Current medicines are reviewed at length with the patient today.  Concerns regarding medicines are outlined above.  Orders Placed This Encounter  Procedures   EKG 12-Lead    Meds ordered this encounter  Medications   carvedilol (COREG) 12.5 MG tablet    Sig: Take 1 tablet (12.5 mg total) by mouth 2 (two) times daily.    Dispense:  180 tablet    Refill:  3   amLODipine (NORVASC) 2.5 MG tablet    Sig: Take 1 tablet (2.5 mg total) by mouth daily.    Dispense:  90 tablet    Refill:  3     Patient Instructions  Medication Instructions:  Increase Coreg to 12.5mg  twice a day  (Check BP's daily and send in on Friday)  Friday (after sending in BP's and Dr review) Start Amlodipine 2.5mg  daily   Follow-Up: At [facility], you and your health needs are our priority.  As part of our continuing mission to provide you with exceptional heart care, we have created designated Provider Care Teams.  These Care  Teams include your primary Cardiologist (physician) and Advanced Practice Providers (APPs -  Physician Assistants and Nurse Practitioners) who all work together to provide you with the care you need, when you need it.  We recommend signing up for the patient portal called "MyChart".  Sign up information is provided on this After Visit Summary.  MyChart is used to connect  with patients for Virtual Visits (Telemedicine).  Patients are able to view lab/test results, encounter notes, upcoming appointments, etc.  Non-urgent messages can be sent to your provider as well.   To learn more about what you can do with MyChart, go to [email or web address].    Your next appointment:    6 weeks with APP  1 yr with Dr Royann Shivers          Signed, Thurmon Fair, MD  02/26/2024 1:42 PM    [facility]

## 2024-02-26 NOTE — Patient Instructions (Addendum)
 Medication Instructions:  Increase Coreg to 12.5mg  twice a day  (Check BP's daily and send in on Friday)  Friday (after sending in BP's and Dr review) Start Amlodipine 2.5mg  daily   Follow-Up: At [facility], you and your health needs are our priority.  As part of our continuing mission to provide you with exceptional heart care, we have created designated Provider Care Teams.  These Care Teams include your primary Cardiologist (physician) and Advanced Practice Providers (APPs -  Physician Assistants and Nurse Practitioners) who all work together to provide you with the care you need, when you need it.  We recommend signing up for the patient portal called "MyChart".  Sign up information is provided on this After Visit Summary.  MyChart is used to connect with patients for Virtual Visits (Telemedicine).  Patients are able to view lab/test results, encounter notes, upcoming appointments, etc.  Non-urgent messages can be sent to your provider as well.   To learn more about what you can do with MyChart, go to [email or web address].    Your next appointment:    6 weeks with APP  1 yr with Dr Royann Shivers

## 2024-02-29 ENCOUNTER — Encounter: Payer: Self-pay | Admitting: Cardiovascular Disease

## 2024-02-29 NOTE — Telephone Encounter (Signed)
 Yes let's hold off on the amlodipine, please. Good news.

## 2024-03-03 ENCOUNTER — Encounter: Payer: Self-pay | Admitting: Cardiovascular Disease

## 2024-03-03 ENCOUNTER — Ambulatory Visit: Payer: Self-pay | Admitting: Professional Counselor

## 2024-03-03 DIAGNOSIS — E119 Type 2 diabetes mellitus without complications: Secondary | ICD-10-CM | POA: Diagnosis not present

## 2024-03-03 DIAGNOSIS — R1011 Right upper quadrant pain: Secondary | ICD-10-CM | POA: Diagnosis not present

## 2024-03-04 ENCOUNTER — Other Ambulatory Visit: Payer: Self-pay | Admitting: Family Medicine

## 2024-03-04 ENCOUNTER — Ambulatory Visit
Admission: RE | Admit: 2024-03-04 | Discharge: 2024-03-04 | Disposition: A | Discharge status: Home / Self Care | Source: Ambulatory Visit | Attending: Family Medicine | Admitting: Family Medicine

## 2024-03-04 DIAGNOSIS — R112 Nausea with vomiting, unspecified: Secondary | ICD-10-CM | POA: Diagnosis not present

## 2024-03-04 DIAGNOSIS — R61 Generalized hyperhidrosis: Secondary | ICD-10-CM | POA: Diagnosis not present

## 2024-03-04 DIAGNOSIS — R109 Unspecified abdominal pain: Secondary | ICD-10-CM | POA: Diagnosis not present

## 2024-03-04 DIAGNOSIS — R110 Nausea: Secondary | ICD-10-CM | POA: Diagnosis not present

## 2024-03-04 DIAGNOSIS — R1011 Right upper quadrant pain: Secondary | ICD-10-CM | POA: Diagnosis not present

## 2024-03-04 DIAGNOSIS — R1013 Epigastric pain: Secondary | ICD-10-CM | POA: Diagnosis not present

## 2024-03-04 MED ORDER — HYDROCHLOROTHIAZIDE 12.5 MG PO CAPS: 12.5000 mg | ORAL_CAPSULE | ORAL | 3 refills | Status: DC

## 2024-03-04 MED ORDER — IOHEXOL 300 MG/ML  SOLN
100.0000 mL | Freq: Once | INTRAMUSCULAR | Status: AC | PRN
  Administered 2024-03-04: 100 mL via INTRAVENOUS

## 2024-03-04 NOTE — Telephone Encounter (Signed)
 Not an expected side effect of carvedilol, although seen with amlodipine. Let's try hydrochlorothiazide 12.5 mg taken only 3 days of week. Give it at least a couple of weeks, the med is slow.

## 2024-03-05 ENCOUNTER — Encounter: Payer: Self-pay | Admitting: Cardiovascular Disease

## 2024-03-06 DIAGNOSIS — R7989 Other specified abnormal findings of blood chemistry: Secondary | ICD-10-CM | POA: Diagnosis not present

## 2024-03-24 DIAGNOSIS — R7989 Other specified abnormal findings of blood chemistry: Secondary | ICD-10-CM | POA: Diagnosis not present

## 2024-03-24 DIAGNOSIS — E66813 Obesity, class 3: Secondary | ICD-10-CM | POA: Diagnosis not present

## 2024-03-24 DIAGNOSIS — E119 Type 2 diabetes mellitus without complications: Secondary | ICD-10-CM | POA: Diagnosis not present

## 2024-03-25 DIAGNOSIS — E66813 Obesity, class 3: Secondary | ICD-10-CM | POA: Diagnosis not present

## 2024-03-25 DIAGNOSIS — E119 Type 2 diabetes mellitus without complications: Secondary | ICD-10-CM | POA: Diagnosis not present

## 2024-03-25 DIAGNOSIS — R7989 Other specified abnormal findings of blood chemistry: Secondary | ICD-10-CM | POA: Diagnosis not present

## 2024-03-25 NOTE — Telephone Encounter (Signed)
 Noted. No new directions. Waiting for next info from her.

## 2024-03-26 DIAGNOSIS — R7989 Other specified abnormal findings of blood chemistry: Secondary | ICD-10-CM | POA: Diagnosis not present

## 2024-04-07 ENCOUNTER — Ambulatory Visit: Attending: Emergency Medicine | Admitting: Emergency Medicine

## 2024-04-07 ENCOUNTER — Encounter: Payer: Self-pay | Admitting: Cardiovascular Disease

## 2024-04-07 ENCOUNTER — Encounter: Payer: Self-pay | Admitting: Emergency Medicine

## 2024-04-07 VITALS — BP 122/86 | HR 91 | Ht 64.0 in | Wt 240.0 lb

## 2024-04-07 DIAGNOSIS — Z6841 Body Mass Index (BMI) 40.0 and over, adult: Secondary | ICD-10-CM

## 2024-04-07 DIAGNOSIS — E1169 Type 2 diabetes mellitus with other specified complication: Secondary | ICD-10-CM

## 2024-04-07 DIAGNOSIS — M10279 Drug-induced gout, unspecified ankle and foot: Secondary | ICD-10-CM | POA: Diagnosis not present

## 2024-04-07 DIAGNOSIS — E669 Obesity, unspecified: Secondary | ICD-10-CM | POA: Diagnosis not present

## 2024-04-07 DIAGNOSIS — E782 Mixed hyperlipidemia: Secondary | ICD-10-CM

## 2024-04-07 DIAGNOSIS — I10 Essential (primary) hypertension: Secondary | ICD-10-CM | POA: Diagnosis not present

## 2024-04-07 DIAGNOSIS — E66813 Obesity, class 3: Secondary | ICD-10-CM | POA: Diagnosis not present

## 2024-04-07 MED ORDER — FUROSEMIDE 20 MG PO TABS: ORAL_TABLET | ORAL | 1 refills | Status: DC

## 2024-04-07 MED ORDER — COLCHICINE 0.6 MG PO TABS: 0.6000 mg | ORAL_TABLET | Freq: Every day | ORAL | 3 refills | Status: AC

## 2024-04-07 MED ORDER — PRAVASTATIN SODIUM 40 MG PO TABS: 40.0000 mg | ORAL_TABLET | Freq: Every day | ORAL | 1 refills | Status: AC

## 2024-04-07 NOTE — Progress Notes (Signed)
 Cardiology Office Note:    Date:  04/07/2024  ID:  Amy [name], DOB May 04, 1966, MRN [id number] PCP: Yehuda Helms, MD  [facility] Providers Cardiologist:  Luana Rumple, MD       Patient Profile:      Chief Complaint: 6-week follow-up for hypertension History of Present Illness:  Amy [name] is a 58 y.o. female with visit-pertinent history of severe obesity, hypertension, hyperlipidemia, type 2 diabetes, fatty liver, GERD  CT angiography August 2020 for chest pain did not show pulmonary embolism and was also significant for the complete absence of atherosclerotic calcification of coronary arteries and thoracic aorta.  Coronary calcium score March 2024 was 0.  She was last seen in office on 02/26/2024 by Dr. Alvis Ba.  It was noted over the past year she was no longer taking her hydrochlorothiazide  due to history of gout.  She was also noted to no longer be taking her ezetimibe  or pravastatin  with her most recent LDL up to 131 with her PCP sequentially started her on rosuvastatin 5 mg daily.  Her blood pressure was elevated at 142/100.  Her carvedilol  was increased to 12.5 mg twice daily and she was started on 2.5 mg amlodipine .  Patient called into nurse triage line on 03/03/2024 with complaints of lower extremity swelling.  Amlodipine  was discontinued and she was started on hydrochlorothiazide  12.5 mg only be taken 3 days a week.   Discussed the use of AI scribe software for clinical note transcription with the patient, who gave verbal consent to proceed.  History of Present Illness Amy [name] is a 58 year old female with hypertension who presents for blood pressure management.  Today she is doing well overall.  She is without acute cardiovascular complaints today.  Her blood pressure is currently 122/86 mmHg after adjusting her carvedilol  dosage to 12.5 mg twice daily.  She is not currently taking her blood pressure at home.  She never started amlodipine  after last  visit and has been inconsistent with hydrochlorothiazide  taking it only sometimes and has not taken it this week. Previously, her blood pressure was 142/100 mmHg, causing dizziness.  She notes that she experienced a gout flareup after starting hydrochlorothiazide .  She plans to reach out to her podiatrist or PCP for gout management.  Her prior cholesterol management included pravastatin  and Zetia , but she self discontinued them when her LDL improved. Rosuvastatin was prescribed by her new primary care provider but patient self discontinued recently due to lower extremity swelling.  Notably patient notes that her swelling had improved after she discontinued Crestor.  She is currently not on statin lowering therapy at this time.  She denies chest pain, shortness of breath, lower extremity edema, fatigue, palpitations, melena, hematuria, hemoptysis, diaphoresis, weakness, presyncope, syncope, orthopnea, and PND.    Review of systems:  Please see the history of present illness. All other systems are reviewed and otherwise negative.     Home Medications:    Current Meds  Medication Sig   ALPRAZolam (XANAX) 0.5 MG tablet Take 0.5 mg by mouth 4 (four) times daily as needed for anxiety.    carvedilol  (COREG ) 12.5 MG tablet Take 1 tablet (12.5 mg total) by mouth 2 (two) times daily.   Continuous Glucose Sensor (FREESTYLE LIBRE 3 PLUS SENSOR) MISC    escitalopram (LEXAPRO) 20 MG tablet Take 20 mg by mouth daily.   furosemide (LASIX) 20 MG tablet TAKE 10 MG (1/2 TABLET) ON MONDAY, WEDNESDAY, AND FRIDAY.   lansoprazole (PREVACID) 15  MG capsule Take 15 mg by mouth daily in the afternoon.   loratadine  (CLARITIN ) 10 MG tablet Take 10 mg by mouth daily.   metFORMIN (GLUCOPHAGE) 1000 MG tablet Take 1,000 mg by mouth 2 (two) times daily.   MOUNJARO 7.5 MG/0.5ML Pen Inject 7.5 mg into the skin once a week.   OneTouch Delica Lancets 30G MISC Inject into the skin daily in the afternoon.   ONETOUCH VERIO test  strip 1 each by Other route daily in the afternoon.   pravastatin  (PRAVACHOL ) 40 MG tablet Take 1 tablet (40 mg total) by mouth daily.   [DISCONTINUED] hydrochlorothiazide  (MICROZIDE ) 12.5 MG capsule Take 1 capsule (12.5 mg total) by mouth 3 (three) times a week.   [DISCONTINUED] MOUNJARO 5 MG/0.5ML Pen Inject 5 mg into the skin once a week.   Studies Reviewed:       CT cardiac scoring 02/02/2023 Coronary calcium score of 0.  Risk Assessment/Calculations:             Physical Exam:   VS:  BP 122/86 (BP Location: Left Arm, Patient Position: Sitting, Cuff Size: Large)   Pulse 91   Ht 5\' 4"  (1.626 m)   Wt 240 lb (108.9 kg)   LMP 05/01/2011   BMI 41.20 kg/m    Wt Readings from Last 3 Encounters:  04/07/24 240 lb (108.9 kg)  02/26/24 250 lb (113.4 kg)  07/16/23 240 lb (108.9 kg)    GEN: Well nourished, well developed in no acute distress NECK: No JVD; No carotid bruits CARDIAC: RRR, no murmurs, rubs, gallops RESPIRATORY:  Clear to auscultation without rales, wheezing or rhonchi  ABDOMEN: Soft, non-tender, non-distended EXTREMITIES:  No edema; No acute deformity     Assessment and Plan:  Hypertension Blood pressure today is 122/86 and under better control, however still above goal of less than 130/80 specifically her diastolic BP Past medication intolerances include; hydrochlorothiazide  due to gout, amlodipine  due to lower extremity swelling, RAAS inhibitors due to hyperkalemia, and RAAS inhibitors with diuretics due to history of prerenal azotemia Recently her carvedilol  was increased to 12.5 mg twice daily for BP of 142/100.  She is not taking her blood pressure at home currently - Today I will prescribe her low-dose diuretic therapy to be taken every other day.  I will start Lasix 10 mg every other day in order to further lower her diastolic blood pressure - Continue carvedilol  12.5 mg twice daily - BMET x 2 weeks - Encouraged to begin BP monitoring at home  Hyperlipidemia LDL  131, TG 250, TC 223 on 01/2024 LDL currently uncontrolled with current goal of less than 100 given CAC score of 0 She had recent intolerance to rosuvastatin due to lower extremity swelling however did tolerate pravastatin  and zetia  in the past with well-controlled cholesterol at that time.  She noted she self discontinued pravastatin  and zetia  in 2024 as her lipids were under good control at the time - Start pravastatin  40 mg daily - Plan for fasting lipid panel and LFTs in 12 weeks  Type 2 diabetes A1c 7.4 on 01/2024 - Currently managed by PCP on metformin and mounjaro  Obesity BMI 41.2 Now on Mounjaro to aid in weight loss.  Had 10 pound weight loss over the past month. - Recommend DASH diet (high in vegetables, fruits, low-fat dairy products, whole grains, poultry, fish, and nuts and low in sweets, sugar-sweetened beverages, and red meats), salt restriction and increase physical activity.  Gout Noted to experience gout exacerbation to toe  after starting hydrochlorothiazide .  Dr. Alvis Ba started patient on colchicine 0.6 mg daily for 3 to 4 days.  She will reach out to her PCP regarding resumption of allopurinol  after exacerbation      Dispo:  Return in about 6 months (around 10/08/2024).  Signed, Ava Boatman, NP

## 2024-04-07 NOTE — Telephone Encounter (Signed)
 Please send a prescription for colchicine 0.6 mg once daily.  Send them 7 tablets with 3 refills.  Tell them they will need to take it for 3-4 days usually until the gout subsides, but they have extra as needed.

## 2024-04-07 NOTE — Patient Instructions (Addendum)
 Medication Instructions:  STOP TAKING ROSUVASTATIN CALCIUM.  STOP TAKING AMLODIPINE . STOP TAKING HYDROCHLOROTHIAZIDE . START TAKING LASIX 10 MG (1/2 TABLET) MONDAY, WEDNESDAY, AND FRIDAY. START PRAVASTATIN  40 MG DAILY.   Lab Work: Nutritional therapist IN 2 WEEKS. FASTING LIPID PANEL AND LFTs IN 12 WEEKS.   Testing/Procedures: NONE  Follow-Up: At [facility], you and your health needs are our priority.  As part of our continuing mission to provide you with exceptional heart care, our providers are all part of one team.  This team includes your primary Cardiologist (physician) and Advanced Practice Providers or APPs (Physician Assistants and Nurse Practitioners) who all work together to provide you with the care you need, when you need it.  Your next appointment:   6 MONTHS  Provider:   Luana Rumple, MD

## 2024-04-08 ENCOUNTER — Other Ambulatory Visit: Payer: Self-pay | Admitting: Cardiovascular Disease

## 2024-04-11 ENCOUNTER — Encounter: Payer: Self-pay | Admitting: Cardiovascular Disease

## 2024-04-28 ENCOUNTER — Ambulatory Visit (INDEPENDENT_AMBULATORY_CARE_PROVIDER_SITE_OTHER)

## 2024-04-28 ENCOUNTER — Ambulatory Visit
Admission: EM | Admit: 2024-04-28 | Discharge: 2024-04-28 | Disposition: A | Discharge status: Home / Self Care | Attending: Family Medicine | Admitting: Family Medicine

## 2024-04-28 DIAGNOSIS — J209 Acute bronchitis, unspecified: Secondary | ICD-10-CM

## 2024-04-28 DIAGNOSIS — R059 Cough, unspecified: Secondary | ICD-10-CM | POA: Diagnosis not present

## 2024-04-28 DIAGNOSIS — R051 Acute cough: Secondary | ICD-10-CM

## 2024-04-28 MED ORDER — AZITHROMYCIN 250 MG PO TABS: 250.0000 mg | ORAL_TABLET | Freq: Every day | ORAL | 0 refills | Status: AC

## 2024-04-28 MED ORDER — BENZONATATE 200 MG PO CAPS
200.0000 mg | ORAL_CAPSULE | Freq: Three times a day (TID) | ORAL | 0 refills | Status: AC | PRN
Start: 2024-04-28 — End: ?

## 2024-04-28 MED ORDER — ALBUTEROL SULFATE HFA 108 (90 BASE) MCG/ACT IN AERS
1.0000 | INHALATION_SPRAY | Freq: Four times a day (QID) | RESPIRATORY_TRACT | 0 refills | Status: AC | PRN
Start: 2024-04-28 — End: ?

## 2024-04-28 NOTE — Discharge Instructions (Signed)
 Your chest x-ray was negative for pneumonia.  Please start azithromycin as prescribed.  Do not take your colchicine  while you are taking this medication as well as for 5 days once you have completed.  You may use the albuterol inhaler as needed for any wheezing or shortness of breath.  Tessalon  as needed for your cough.  Lots of rest and fluids.  Please follow-up with your PCP in 2 days for recheck.  Please go to the ER for any worsening symptoms.  Hope you feel better soon!

## 2024-04-28 NOTE — ED Triage Notes (Signed)
 Pt present with  cough, nasal and throat drainage.   Has been on abx for two week

## 2024-04-28 NOTE — ED Provider Notes (Addendum)
 [facility]    CSN: [id number] Arrival date & time: 04/28/24  1426      History   Chief Complaint Chief Complaint  Patient presents with   Cough    HPI Amy [name] is a 58 y.o. female  presents for evaluation of URI symptoms for 2 weeks. Patient reports associated symptoms of cough, congestion, postnasal drip, shortness of breath with coughing. Denies N/V/D, fevers, sore throat, ear pain, body aches. Patient does note that have a hx of asthma.  States her PCP called her in doxycycline  that she completed 2 to 3 days ago with no improvement in symptoms.  Pt has no other concerns at this time.    Cough   Past Medical History:  Diagnosis Date   Anemia    Anxiety    xanax prn   Blood transfusion without reported diagnosis    Degenerative disc disease    Diabetes mellitus without complication (HCC)    Fatty liver    Fibromyalgia    GERD (gastroesophageal reflux disease)    prilosec otc-instructed to take dos   Gout    Hot flashes 02/19/2014   Hypertension    controlled on hctz-bp at pat 145/94   Neuromuscular disorder (HCC)    firbrmylagia   Obesity    Panic attacks    PONV (postoperative nausea and vomiting)    pt states has not had ponv but has been treated in past with scopolamine , does admit to history of motion sickness, history of vertigo   Vertigo    recent diagnosis of vertigo-uses otc meclizine    Weight gain 02/19/2014    Patient Active Problem List   Diagnosis Date Noted   Essential hypertension 10/28/2021   Mixed hyperlipidemia 10/28/2021   Type 2 diabetes mellitus with obesity (HCC) 10/28/2021   NASH (nonalcoholic steatohepatitis) 10/28/2021   Morbid obesity (HCC) 11/17/2019   Insomnia 11/20/2018   Abscess of vagina 11/16/2018   Social phobia 08/24/2018   Panic disorder 08/24/2018   Night sweats 03/19/2017   Screening for colorectal cancer 03/19/2017   Rectocele 03/19/2017   Rectal irritation 03/19/2017   Hot flashes 02/19/2014    Weight gain 02/19/2014   Internal hemorrhoid 02/19/2014    Past Surgical History:  Procedure Laterality Date   ABDOMINAL HYSTERECTOMY     BREAST REDUCTION SURGERY     2010   ECTOPIC PREGNANCY SURGERY     2002   LAPAROSCOPIC ASSISTED VAGINAL HYSTERECTOMY  06/14/2011   Procedure: LAPAROSCOPIC ASSISTED VAGINAL HYSTERECTOMY;  Surgeon: Denette Finner, MD;  Location: [facility];  Service: Gynecology;  Laterality: N/A;  Abdomen and vagina   LAPAROSCOPY     2006-for adhesions   REDUCTION MAMMAPLASTY Bilateral    2009    OB History     Gravida  2   Para      Term      Preterm      AB  2   Living  0      SAB  1   IAB      Ectopic  1   Multiple      Live Births               Home Medications    Prior to Admission medications   Medication Sig Start Date End Date Taking? Authorizing Provider  albuterol (VENTOLIN HFA) 108 (90 Base) MCG/ACT inhaler Inhale 1-2 puffs into the lungs every 6 (six) hours as needed. 04/28/24  Yes Pankaj Haack, Jodi R, NP  ALPRAZolam (  XANAX) 0.5 MG tablet Take 0.5 mg by mouth 4 (four) times daily as needed for anxiety.     [provider]  azithromycin (ZITHROMAX) 250 MG tablet Take 1 tablet (250 mg total) by mouth daily. Take first 2 tablets together, then 1 every day until finished. 04/28/24  Yes Jerricka Carvey, Jodi R, NP  benzonatate  (TESSALON ) 200 MG capsule Take 1 capsule (200 mg total) by mouth 3 (three) times daily as needed. 04/28/24  Yes Abhimanyu Cruces, Jodi R, NP  carvedilol  (COREG ) 12.5 MG tablet Take 1 tablet (12.5 mg total) by mouth 2 (two) times daily. 02/26/24   Croitoru, Mihai, MD  colchicine  0.6 MG tablet Take 1 tablet (0.6 mg total) by mouth daily. 04/07/24   Croitoru, Mihai, MD  Continuous Glucose Sensor (FREESTYLE LIBRE 3 PLUS SENSOR) MISC  02/13/24   [provider]  escitalopram (LEXAPRO) 20 MG tablet Take 20 mg by mouth daily.    [provider]  furosemide  (LASIX ) 20 MG tablet TAKE 10 MG (1/2 TABLET) ON MONDAY, WEDNESDAY, AND  FRIDAY. 04/07/24   Palmer Bobo L, NP  lansoprazole (PREVACID) 15 MG capsule Take 15 mg by mouth daily in the afternoon. 02/17/22   [provider]  loratadine  (CLARITIN ) 10 MG tablet Take 10 mg by mouth daily.    [provider]  metFORMIN (GLUCOPHAGE) 1000 MG tablet Take 1,000 mg by mouth 2 (two) times daily. 07/18/22   [provider]  MOUNJARO 7.5 MG/0.5ML Pen Inject 7.5 mg into the skin once a week. 03/19/24   [provider]  OneTouch Delica Lancets 30G MISC Inject into the skin daily in the afternoon. 10/07/18   [provider]  [facility] VERIO test strip 1 each by Other route daily in the afternoon. 11/06/22   [provider]  pravastatin  (PRAVACHOL ) 40 MG tablet Take 1 tablet (40 mg total) by mouth daily. 04/07/24   Ava Boatman, NP  allopurinol  (ZYLOPRIM ) 100 MG tablet Take 1 tablet (100 mg total) by mouth 2 (two) times daily. 05/30/23   Hyatt, Max T, DPM    Family History Family History  Problem Relation Age of Onset   Cancer Paternal Uncle        liver    Cancer Maternal Grandmother        throat   Arthritis Mother    Anxiety disorder Mother    Depression Mother    Diabetes Father    Hypertension Father    Arthritis Sister    Diabetes Maternal Grandfather    Hypertension Maternal Grandfather    Diabetes Paternal Grandmother    Hypertension Paternal Grandmother    Anxiety disorder Paternal Grandmother    Diabetes Paternal Grandfather    Hypertension Paternal Grandfather    Mood Disorder Brother    Arthritis Sister    Panic disorder Sister    Psychosis Maternal Uncle    Mood Disorder Maternal Uncle    Anxiety disorder Paternal Uncle     Social History Social History   Tobacco Use   Smoking status: Never   Smokeless tobacco: Never  Substance Use Topics   Alcohol use: No   Drug use: No     Allergies   Carbamazepine , Metformin, Ciprofloxacin, and Hydromorphone   Review of Systems Review of Systems   HENT:  Positive for congestion and postnasal drip.   Respiratory:  Positive for cough.      Physical Exam Triage Vital Signs ED Triage Vitals  Encounter Vitals Group     BP 04/28/24  1456 136/84     Systolic BP Percentile --      Diastolic BP Percentile --      Pulse Rate 04/28/24 1455 90     Resp 04/28/24 1455 18     Temp 04/28/24 1456 98.2 F (36.8 C)     Temp Source 04/28/24 1456 Oral     SpO2 04/28/24 1455 96 %     Weight --      Height --      Head Circumference --      Peak Flow --      Pain Score 04/28/24 1455 0     Pain Loc --      Pain Education --      Exclude from Growth Chart --    No data found.  Updated Vital Signs BP 136/84   Pulse 90   Temp 98.2 F (36.8 C) (Oral)   Resp 18   LMP 05/01/2011   SpO2 96%   Visual Acuity Right Eye Distance:   Left Eye Distance:   Bilateral Distance:    Right Eye Near:   Left Eye Near:    Bilateral Near:     Physical Exam Vitals and nursing note reviewed.  Constitutional:      General: She is not in acute distress.    Appearance: She is well-developed. She is not ill-appearing.  HENT:     Head: Normocephalic and atraumatic.     Right Ear: Tympanic membrane and ear canal normal.     Left Ear: Tympanic membrane and ear canal normal.     Nose: Congestion present.     Mouth/Throat:     Mouth: Mucous membranes are moist.     Pharynx: Oropharynx is clear. Uvula midline. No posterior oropharyngeal erythema.     Tonsils: No tonsillar exudate or tonsillar abscesses.  Eyes:     Conjunctiva/sclera: Conjunctivae normal.     Pupils: Pupils are equal, round, and reactive to light.  Cardiovascular:     Rate and Rhythm: Normal rate and regular rhythm.     Heart sounds: Normal heart sounds.  Pulmonary:     Effort: Pulmonary effort is normal.     Breath sounds: Normal breath sounds. No wheezing, rhonchi or rales.  Musculoskeletal:     Cervical back: Normal range of motion and neck supple.  Lymphadenopathy:      Cervical: No cervical adenopathy.  Skin:    General: Skin is warm and dry.  Neurological:     General: No focal deficit present.     Mental Status: She is alert and oriented to person, place, and time.  Psychiatric:        Mood and Affect: Mood normal.        Behavior: Behavior normal.      UC Treatments / Results  Labs (all labs ordered are listed, but only abnormal results are displayed) Labs Reviewed - No data to display  Comprehensive Metabolic Panel (CMP) Order: [id number] Component Ref Range & Units 3 mo ago  Glucose 70 - 110 mg/dL 91  Sodium 644 - 034 mmol/L 141  Potassium 3.6 - 5.1 mmol/L 4.5  Chloride 97 - 109 mmol/L 103  Carbon Dioxide (CO2) 22.0 - 32.0 mmol/L 31.4  Urea Nitrogen (BUN) 7 - 25 mg/dL 5 Low   Creatinine 0.6 - 1.1 mg/dL 0.7  Glomerular Filtration Rate (eGFR) >60 mL/min/1.73sq m 102  Comment: CKD-EPI (2021) does not include patient's race in the calculation of eGFR.  Monitoring changes of plasma creatinine  and eGFR over time is useful for monitoring kidney function.  Interpretive Ranges for eGFR (CKD-EPI 2021):  eGFR:       >60 mL/min/1.73 sq. m - Normal eGFR:       30-59 mL/min/1.73 sq. m - Moderately Decreased eGFR:       15-29 mL/min/1.73 sq. m  - Severely Decreased eGFR:       < 15 mL/min/1.73 sq. m  - Kidney Failure   Note: These eGFR calculations do not apply in acute situations when eGFR is changing rapidly or patients on dialysis.  Calcium 8.7 - 10.3 mg/dL 9.8  AST 8 - 39 U/L 38  ALT 5 - 38 U/L 32  Alk Phos (alkaline Phosphatase) 34 - 104 U/L 106 High   Albumin 3.5 - 4.8 g/dL 4.5  Bilirubin, Total 0.3 - 1.2 mg/dL 0.8  Protein, Total 6.1 - 7.9 g/dL 7.1  A/G Ratio 1.0 - 5.0 gm/dL 1.7  Resulting Agency [facility] - LAB   Specimen Collected: 01/16/24 14:52   Performed by: [facility] - [facility] Last Resulted: 01/16/24 17:10  Received From: [facility]  Result Received: 01/21/24 09:35     EKG   Radiology DG Chest 2 View Result Date: 04/28/2024 CLINICAL DATA:  Cough for 2 weeks EXAM: CHEST - 2 VIEW COMPARISON:  07/16/2023 FINDINGS: Midline trachea. Normal heart size and mediastinal contours. No pleural effusion or pneumothorax. Clear lungs. IMPRESSION: No active cardiopulmonary disease. Electronically Signed   By: Lore Rode M.D.   On: 04/28/2024 15:46    Procedures Procedures (including critical care time)  Medications Ordered in UC Medications - No data to display  Initial Impression / Assessment and Plan / UC Course  I have reviewed the triage vital signs and the nursing notes.  Pertinent labs & imaging results that were available during my care of the patient were reviewed by me and considered in my medical decision making (see chart for details).     Reviewed exam and symptoms with patient.  No red flags.  Chest x-ray negative for pneumonia.  Will start azithromycin for persistent/length of symptoms.  Patient takes colchicine  as needed for gout.  She was instructed not to take this while taking azithromycin as well as for 5 days when she completes azithromycin and she verbalized understanding.  Albuterol inhaler as needed.  Tessalon .  Cough.  Discussed rest fluids and PCP follow-up 2 days for recheck.  ER precautions reviewed and patient verbalized understanding. Final Clinical Impressions(s) / UC Diagnoses   Final diagnoses:  Acute cough  Acute bronchitis, unspecified organism     Discharge Instructions      Your chest x-ray was negative for pneumonia.  Please start azithromycin as prescribed.  Do not take your colchicine  while you are taking this medication as well as for 5 days once you have completed.  You may use the albuterol inhaler as needed for any wheezing or shortness of breath.  Tessalon  as needed for your cough.  Lots of rest and fluids.  Please follow-up with your PCP in 2 days for recheck.  Please go to the ER for any worsening symptoms.   Hope you feel better soon!   ED Prescriptions     Medication Sig Dispense Auth. Provider   albuterol (VENTOLIN HFA) 108 (90 Base) MCG/ACT inhaler Inhale 1-2 puffs into the lungs every 6 (six) hours as needed. 1 each Aloura Matsuoka, Jodi R, NP   azithromycin (ZITHROMAX) 250 MG tablet Take 1 tablet (250 mg  total) by mouth daily. Take first 2 tablets together, then 1 every day until finished. 6 tablet Analysa Nutting, Jodi R, NP   benzonatate  (TESSALON ) 200 MG capsule Take 1 capsule (200 mg total) by mouth 3 (three) times daily as needed. 20 capsule Channa Hazelett, Jodi R, NP      PDMP not reviewed this encounter.   Alleen Arbour, NP 04/28/24 1559    Alleen Arbour, NP 04/28/24 1600

## 2024-05-02 ENCOUNTER — Ambulatory Visit: Payer: Self-pay | Admitting: Professional Counselor

## 2024-05-19 ENCOUNTER — Other Ambulatory Visit: Payer: Self-pay | Admitting: Cardiovascular Disease

## 2024-05-29 DIAGNOSIS — E119 Type 2 diabetes mellitus without complications: Secondary | ICD-10-CM | POA: Diagnosis not present

## 2024-05-29 DIAGNOSIS — E785 Hyperlipidemia, unspecified: Secondary | ICD-10-CM | POA: Diagnosis not present

## 2024-05-29 DIAGNOSIS — E1169 Type 2 diabetes mellitus with other specified complication: Secondary | ICD-10-CM | POA: Diagnosis not present

## 2024-05-29 DIAGNOSIS — E118 Type 2 diabetes mellitus with unspecified complications: Secondary | ICD-10-CM | POA: Diagnosis not present

## 2024-05-29 DIAGNOSIS — M7061 Trochanteric bursitis, right hip: Secondary | ICD-10-CM | POA: Diagnosis not present

## 2024-05-29 DIAGNOSIS — E66813 Obesity, class 3: Secondary | ICD-10-CM | POA: Diagnosis not present

## 2024-06-08 ENCOUNTER — Other Ambulatory Visit: Payer: Self-pay | Admitting: Cardiovascular Disease

## 2024-06-23 ENCOUNTER — Ambulatory Visit: Payer: Self-pay | Admitting: Professional Counselor

## 2024-07-23 DIAGNOSIS — R590 Localized enlarged lymph nodes: Secondary | ICD-10-CM | POA: Diagnosis not present

## 2024-07-23 DIAGNOSIS — E118 Type 2 diabetes mellitus with unspecified complications: Secondary | ICD-10-CM | POA: Diagnosis not present

## 2024-09-01 ENCOUNTER — Encounter: Payer: Self-pay | Admitting: Cardiovascular Disease

## 2024-09-01 DIAGNOSIS — E119 Type 2 diabetes mellitus without complications: Secondary | ICD-10-CM | POA: Diagnosis not present

## 2024-09-04 DIAGNOSIS — E119 Type 2 diabetes mellitus without complications: Secondary | ICD-10-CM | POA: Diagnosis not present

## 2024-09-04 DIAGNOSIS — E782 Mixed hyperlipidemia: Secondary | ICD-10-CM | POA: Diagnosis not present

## 2024-09-04 DIAGNOSIS — I10 Essential (primary) hypertension: Secondary | ICD-10-CM | POA: Diagnosis not present

## 2024-09-04 DIAGNOSIS — Z0000 Encounter for general adult medical examination without abnormal findings: Secondary | ICD-10-CM | POA: Diagnosis not present

## 2024-09-04 DIAGNOSIS — M797 Fibromyalgia: Secondary | ICD-10-CM | POA: Diagnosis not present

## 2024-09-04 DIAGNOSIS — Z79899 Other long term (current) drug therapy: Secondary | ICD-10-CM | POA: Diagnosis not present

## 2024-09-04 DIAGNOSIS — F4310 Post-traumatic stress disorder, unspecified: Secondary | ICD-10-CM | POA: Diagnosis not present

## 2024-09-04 DIAGNOSIS — F411 Generalized anxiety disorder: Secondary | ICD-10-CM | POA: Diagnosis not present

## 2024-09-08 DIAGNOSIS — E119 Type 2 diabetes mellitus without complications: Secondary | ICD-10-CM | POA: Diagnosis not present

## 2024-09-08 DIAGNOSIS — E785 Hyperlipidemia, unspecified: Secondary | ICD-10-CM | POA: Diagnosis not present

## 2024-09-08 DIAGNOSIS — E66812 Obesity, class 2: Secondary | ICD-10-CM | POA: Diagnosis not present

## 2024-09-08 DIAGNOSIS — E1169 Type 2 diabetes mellitus with other specified complication: Secondary | ICD-10-CM | POA: Diagnosis not present

## 2024-09-18 ENCOUNTER — Encounter: Payer: Self-pay | Admitting: Cardiovascular Disease

## 2024-09-18 MED ORDER — FUROSEMIDE 20 MG PO TABS: ORAL_TABLET | ORAL | 1 refills | Status: AC

## 2024-12-09 ENCOUNTER — Ambulatory Visit

## 2024-12-09 VITALS — BP 122/73 | HR 82 | Ht 64.0 in | Wt 221.8 lb

## 2024-12-09 DIAGNOSIS — R21 Rash and other nonspecific skin eruption: Secondary | ICD-10-CM

## 2024-12-09 DIAGNOSIS — L987 Excessive and redundant skin and subcutaneous tissue: Secondary | ICD-10-CM | POA: Diagnosis not present

## 2024-12-09 DIAGNOSIS — E65 Localized adiposity: Secondary | ICD-10-CM

## 2024-12-09 DIAGNOSIS — Z6838 Body mass index (BMI) 38.0-38.9, adult: Secondary | ICD-10-CM

## 2024-12-09 NOTE — Progress Notes (Signed)
 Plastic & Reconstructive Surgery New Patient Visit  Patient: Amy [name] MRN: [id number] Date: 12/09/2024  Referring Physician: @REFERRINGPHYSICIAN @   Chief Complaint: Excess skin   History of Present Illness:  This is a 59 y.o. female who presents for consultation for body contouring following massive weight loss.  The patient has been on weight loss medications. Prior to the medications, her highest weight was 270 lbs. Since then, she has lost almost 50 lbs. Her weight today is 221 lbs and Body mass index is 38.07 kg/m.. With the weight loss she has noted excess skin involving abdomen. Pt complains of rashes in between the skin folds which persist despite antifungal creams and powders. She also does not like the round appearance of her upper abdomen.   No known nutritional issues. No hernias. Other abdominal surgeries include C-Section. Pt does not smoke. Functional status is good.   Body surface area is 2.13 meters squared.  Past Medical History: Past Medical History:  Diagnosis Date   Anemia    Anxiety    xanax prn   Blood transfusion without reported diagnosis    Degenerative disc disease    Diabetes mellitus without complication (HCC)    Fatty liver    Fibromyalgia    GERD (gastroesophageal reflux disease)    prilosec otc-instructed to take dos   Gout    Hot flashes 02/19/2014   Hypertension    controlled on hctz-bp at pat 145/94   Neuromuscular disorder (HCC)    firbrmylagia   Obesity    Panic attacks    PONV (postoperative nausea and vomiting)    pt states has not had ponv but has been treated in past with scopolamine , does admit to history of motion sickness, history of vertigo   Vertigo    recent diagnosis of vertigo-uses otc meclizine    Weight gain 02/19/2014    Past Surgical History: Past Surgical History:  Procedure Laterality Date   ABDOMINAL HYSTERECTOMY     BREAST REDUCTION SURGERY     2010   ECTOPIC PREGNANCY SURGERY     2002   LAPAROSCOPIC  ASSISTED VAGINAL HYSTERECTOMY  06/14/2011   Procedure: LAPAROSCOPIC ASSISTED VAGINAL HYSTERECTOMY;  Surgeon: Alm JAYSON Cook, MD;  Location: [facility];  Service: Gynecology;  Laterality: N/A;  Abdomen and vagina   LAPAROSCOPY     2006-for adhesions   REDUCTION MAMMAPLASTY Bilateral    2009    Current Medications: Medications Ordered Prior to Encounter[1]  Allergies: Allergies[2]  Family History:  Family history is negative for bleeding/clotting disorders, problems with anesthesia, or connective tissue disorders.   Social History:  Social History   Socioeconomic History   Marital status: Married    Spouse name: Not on file   Number of children: Not on file   Years of education: Not on file   Highest education level: Not on file  Occupational History   Not on file  Tobacco Use   Smoking status: Never   Smokeless tobacco: Never  Substance and Sexual Activity   Alcohol use: No   Drug use: No   Sexual activity: Yes    Partners: Male    Birth control/protection: Surgical    Comment: married  Other Topics Concern   Not on file  Social History Narrative   Not on file   Social Drivers of Health   Tobacco Use: Low Risk (12/09/2024)   Patient History    Smoking Tobacco Use: Never    Smokeless Tobacco Use: Never    Passive Exposure: Not  on file  Financial Resource Strain: Low Risk  (03/04/2024)   Received from [facility]   Overall Financial Resource Strain (CARDIA)    Difficulty of Paying Living Expenses: Not hard at all  Food Insecurity: No Food Insecurity (03/04/2024)   Received from [facility]   Epic    Within the past 12 months, you worried that your food would run out before you got the money to buy more.: Never true    Within the past 12 months, the food you bought just didn't last and you didn't have money to get more.: Never true  Transportation Needs: No Transportation Needs (03/04/2024)   Received from [facility] - Transportation    In the past 12 months, has lack of transportation kept you from medical appointments or from getting medications?: No    Lack of Transportation (Non-Medical): No  Physical Activity: Not on file  Stress: Not on file  Social Connections: Not on file  Depression (PHQ2-9): High Risk (09/14/2023)   Depression (PHQ2-9)    PHQ-2 Score: 20  Alcohol Screen: Not on file  Housing: Low Risk  (11/11/2024)   Received from [facility]   Epic    In the last 12 months, was there a time when you were not able to pay the mortgage or rent on time?: No    In the past 12 months, how many times have you moved where you were living?: 0    At any time in the past 12 months, were you homeless or living in a shelter (including now)?: No  Utilities: Not At Risk (03/04/2024)   Received from [facility]ities    Threatened with loss of utilities: No  Health Literacy: Not on file   Smoker Denies Lives: Husband Recreational Drug use: Denies  Review of systems: 10 point review of systems performed and negative except as noted in the HPI  Physical Exam: BP 122/73 (BP Location: Left Arm, Patient Position: Sitting, Cuff Size: Large)   Pulse 82   Ht 5' 4 (1.626 m)   Wt 221 lb 12.8 oz (100.6 kg)   LMP 05/01/2011   SpO2 96%   BMI 38.07 kg/m  Body mass index is 38.07 kg/m.  Physical Exam           General: Well appearing, no apparent distress. Pulm: Breathing comfortably on room air without sounds/wheezing. CV: Regular rate. Good perfusion of extremities. Chest: Chest wall without abnormality or obvious deformity. No scoliosis, pectus excavatum or pectus carinatum. Abdomen: Soft, non-tender, no distension. No palpable hernia or bulge. Diastasis presebt. Global lipodystrophy of abdomen with excess skin in both horizontal and vertical dimensions. Skin excess in infraumbilical region with infraumbilical fat deposits.Overhanging pannus to  the level of the mons/pubic symphysis. Redundant/ptotic mons tissue.  Excess skin extends circumferentially along posterior trunk. Current intertriginous rash Well healed scars present   Neuro: Moving all four extremities spontaneously. Psych: Appropriate mood and affect.  Labs: No results found for this or any previous visit (from the past 2160 hours).   Pertinent Imaging:  None  Assessment: This is a 59 y.o. female with history of massive weight loss who presents for consultation for body contouring. The patient has maintained a 50 lb weight loss for over 3 months and has excess skin on their abdomen. Based on the patient's exam and discussion of patient's goals today, I believe the patient would be an  appropriate candidate for panniculectomy. The patient has shown interest about a abdominoplasty. I did explain that she is not a good candidate at this time but if she loses more wight she may be in the near future.    We discussed the patient's goals and priorities at length. We reviewed that body contouring operations trade better contours at the expense of long, sometimes wide scars. We further discussed the operation(s) in detail including the incision and scar patterns, need for surgical drains and importance of postoperative compression. We reviewed that massive weight loss patients are at a higher risk for recurrent laxity and wound healing problems including dehiscence and infection. We further discussed the risks of bleeding, seroma, hematoma, asymmetries, standing cutaneous deformities or other contour deformities, asymmetry, skin/nipple/umbilical necrosis, fat necrosis/oil cysts, lymphedema, chronic wounds or sinus tracts, hypertrophic and keloid scarring, nerve injury (ie. LFCN, iliohypogastric, ilioinguinal nerves), numbness, paresthesia and sensory changes, intraabdominal injury. Revision procedures are not uncommon. We also discussed the risk of DVT/PE, fat embolism, heart attack,  stroke, death as well as the risks of anesthesia. We reviewed the expected recovery period with no heavy activities or lifting >5lbs for 6 weeks postoperatively.   Timing of body contouring procedures should allow 6 months at a stable weight for patients to achieve metabolic and nutritional homeostasis.  The patient's BMI today is 38. We discussed that patients with BMI>30 are more likely to suffer both minor and major complications following these procedures. The patient verbalized understanding of this concept and is willing to accept these increased risks.   The patient voiced understanding and wishes to proceed. All questions and concerns were addressed to the patient's apparent satisfaction.  Plan - We will plan for healthy weight and wellness to continue to lose weight. - Follow up with me in 3 months.  The sensitive parts of the examination/procedure were performed with MA as chaperone.  The time documented represents the total time spent on the day of the encounter in preparing for and completing the visit. It does not include time spent by ancillary staff, a resident, a fellow, another trainee, or, for shared visits, time spent jointly with the patient or discussing the case or the performance of other separately performed services.  Time spent: 45 minutes.     Ailyne Pawley, MD [facility]  12/09/2024 11:34 AM     [1]  Current Outpatient Medications on File Prior to Visit  Medication Sig Dispense Refill   ALPRAZolam (XANAX) 0.5 MG tablet Take 0.5 mg by mouth 4 (four) times daily as needed for anxiety.      carvedilol  (COREG ) 12.5 MG tablet Take 1 tablet (12.5 mg total) by mouth 2 (two) times daily. 180 tablet 3   escitalopram (LEXAPRO) 20 MG tablet Take 20 mg by mouth daily.     lansoprazole (PREVACID) 15 MG capsule Take 15 mg by mouth daily in the afternoon.     loratadine  (CLARITIN ) 10 MG tablet Take 10 mg by mouth daily.     metFORMIN  (GLUCOPHAGE) 1000 MG tablet Take 1,000 mg by mouth 2 (two) times daily.     MOUNJARO 7.5 MG/0.5ML Pen Inject 7.5 mg into the skin once a week.     albuterol  (VENTOLIN  HFA) 108 (90 Base) MCG/ACT inhaler Inhale 1-2 puffs into the lungs every 6 (six) hours as needed. 1 each 0   azithromycin  (ZITHROMAX ) 250 MG tablet Take 1 tablet (250 mg total) by mouth daily. Take first 2 tablets together, then 1  every day until finished. 6 tablet 0   benzonatate  (TESSALON ) 200 MG capsule Take 1 capsule (200 mg total) by mouth 3 (three) times daily as needed. 20 capsule 0   colchicine  0.6 MG tablet Take 1 tablet (0.6 mg total) by mouth daily. 7 tablet 3   Continuous Glucose Sensor (FREESTYLE LIBRE 3 PLUS SENSOR) MISC      furosemide  (LASIX ) 20 MG tablet TAKE 10 MG (1/2 TABLET) ON MONDAY, WEDNESDAY, AND FRIDAY. 45 tablet 1   OneTouch Delica Lancets 30G MISC Inject into the skin daily in the afternoon.     ONETOUCH VERIO test strip 1 each by Other route daily in the afternoon.     pravastatin  (PRAVACHOL ) 40 MG tablet Take 1 tablet (40 mg total) by mouth daily. (Patient not taking: Reported on 12/09/2024) 90 tablet 1   [DISCONTINUED] allopurinol  (ZYLOPRIM ) 100 MG tablet Take 1 tablet (100 mg total) by mouth 2 (two) times daily. 60 tablet 6   No current facility-administered medications on file prior to visit.  [2]  Allergies Allergen Reactions   Carbamazepine     Metformin Hives    Able to tolerate Metformin XR   Ciprofloxacin Rash   Hydromorphone Nausea And Vomiting and Rash    Other Reaction(s): GI Intolerance

## 2024-12-12 ENCOUNTER — Institutional Professional Consult (permissible substitution)

## 2025-02-02 ENCOUNTER — Ambulatory Visit: Payer: Self-pay | Admitting: Professional Counselor

## 2025-02-20 ENCOUNTER — Ambulatory Visit: Admitting: Cardiovascular Disease

## 2025-03-11 ENCOUNTER — Ambulatory Visit
